# Patient Record
Sex: Female | Born: 1935 | Race: White | Hispanic: No | Marital: Single | Smoking: Former smoker
Health system: Southern US, Community
[De-identification: ages and names within clinical notes are randomized; demographics above are authoritative.]

## PROBLEM LIST (undated history)

## (undated) DIAGNOSIS — K000 Anodontia: Secondary | ICD-10-CM

## (undated) DIAGNOSIS — Z86718 Personal history of other venous thrombosis and embolism: Secondary | ICD-10-CM

## (undated) DIAGNOSIS — R0602 Shortness of breath: Secondary | ICD-10-CM

## (undated) DIAGNOSIS — D638 Anemia in other chronic diseases classified elsewhere: Secondary | ICD-10-CM

## (undated) DIAGNOSIS — D696 Thrombocytopenia, unspecified: Secondary | ICD-10-CM

## (undated) DIAGNOSIS — E119 Type 2 diabetes mellitus without complications: Secondary | ICD-10-CM

## (undated) DIAGNOSIS — N189 Chronic kidney disease, unspecified: Secondary | ICD-10-CM

## (undated) DIAGNOSIS — K219 Gastro-esophageal reflux disease without esophagitis: Secondary | ICD-10-CM

## (undated) DIAGNOSIS — J4540 Moderate persistent asthma, uncomplicated: Secondary | ICD-10-CM

## (undated) DIAGNOSIS — M170 Bilateral primary osteoarthritis of knee: Secondary | ICD-10-CM

## (undated) DIAGNOSIS — W19XXXA Unspecified fall, initial encounter: Secondary | ICD-10-CM

## (undated) DIAGNOSIS — E785 Hyperlipidemia, unspecified: Secondary | ICD-10-CM

## (undated) DIAGNOSIS — F39 Unspecified mood [affective] disorder: Secondary | ICD-10-CM

## (undated) DIAGNOSIS — R251 Tremor, unspecified: Secondary | ICD-10-CM

## (undated) DIAGNOSIS — K08109 Complete loss of teeth, unspecified cause, unspecified class: Secondary | ICD-10-CM

## (undated) DIAGNOSIS — I421 Obstructive hypertrophic cardiomyopathy: Secondary | ICD-10-CM

## (undated) DIAGNOSIS — F32A Depression, unspecified: Secondary | ICD-10-CM

## (undated) DIAGNOSIS — R296 Repeated falls: Secondary | ICD-10-CM

## (undated) DIAGNOSIS — Z9289 Personal history of other medical treatment: Secondary | ICD-10-CM

## (undated) DIAGNOSIS — I739 Peripheral vascular disease, unspecified: Secondary | ICD-10-CM

## (undated) DIAGNOSIS — M8580 Other specified disorders of bone density and structure, unspecified site: Secondary | ICD-10-CM

## (undated) DIAGNOSIS — F70 Mild intellectual disabilities: Secondary | ICD-10-CM

## (undated) DIAGNOSIS — I10 Essential (primary) hypertension: Secondary | ICD-10-CM

## (undated) DIAGNOSIS — Z7901 Long term (current) use of anticoagulants: Secondary | ICD-10-CM

## (undated) DIAGNOSIS — T7840XA Allergy, unspecified, initial encounter: Secondary | ICD-10-CM

## (undated) DIAGNOSIS — F329 Major depressive disorder, single episode, unspecified: Secondary | ICD-10-CM

## (undated) HISTORY — DX: Thrombocytopenia, unspecified: D69.6

## (undated) HISTORY — DX: Other specified disorders of bone density and structure, unspecified site: M85.80

## (undated) HISTORY — DX: Long term (current) use of anticoagulants: Z79.01

## (undated) HISTORY — DX: Major depressive disorder, single episode, unspecified: F32.9

## (undated) HISTORY — DX: Hyperlipidemia, unspecified: E78.5

## (undated) HISTORY — DX: Chronic kidney disease, unspecified: N18.9

## (undated) HISTORY — DX: Type 2 diabetes mellitus without complications: E11.9

## (undated) HISTORY — DX: Tremor, unspecified: R25.1

## (undated) HISTORY — DX: Anodontia: K00.0

## (undated) HISTORY — PX: ABDOMINAL HYSTERECTOMY: SHX81

## (undated) HISTORY — DX: Unspecified mood (affective) disorder: F39

## (undated) HISTORY — DX: Gastro-esophageal reflux disease without esophagitis: K21.9

## (undated) HISTORY — PX: ENDOSCOPIC RETROGRADE CHOLANGIOPANCREATOGRAPHY W/ SPHINCTEROTOMY AND STONE REMOVAL: SHX1506

## (undated) HISTORY — DX: Shortness of breath: R06.02

## (undated) HISTORY — DX: Moderate persistent asthma, uncomplicated: J45.40

## (undated) HISTORY — DX: Personal history of other venous thrombosis and embolism: Z86.718

## (undated) HISTORY — DX: Allergy, unspecified, initial encounter: T78.40XA

## (undated) HISTORY — DX: Depression, unspecified: F32.A

## (undated) HISTORY — DX: Anemia in other chronic diseases classified elsewhere: D63.8

## (undated) HISTORY — DX: Unspecified fall, initial encounter: W19.XXXA

## (undated) HISTORY — DX: Obstructive hypertrophic cardiomyopathy: I42.1

## (undated) HISTORY — DX: Personal history of other medical treatment: Z92.89

## (undated) HISTORY — DX: Complete loss of teeth, unspecified cause, unspecified class: K08.109

## (undated) HISTORY — PX: CHOLECYSTECTOMY: SHX55

## (undated) HISTORY — DX: Bilateral primary osteoarthritis of knee: M17.0

## (undated) HISTORY — DX: Repeated falls: R29.6

## (undated) HISTORY — DX: Mild intellectual disabilities: F70

## (undated) HISTORY — PX: OTHER SURGICAL HISTORY: SHX169

## (undated) HISTORY — DX: Peripheral vascular disease, unspecified: I73.9

## (undated) HISTORY — DX: Essential (primary) hypertension: I10

---

## 2005-07-06 ENCOUNTER — Emergency Department (HOSPITAL_COMMUNITY): Admission: EM | Admit: 2005-07-06 | Discharge: 2005-07-07 | Payer: Self-pay | Admitting: Emergency Medicine

## 2005-08-23 ENCOUNTER — Inpatient Hospital Stay (HOSPITAL_COMMUNITY): Admission: EM | Admit: 2005-08-23 | Discharge: 2005-08-28 | Payer: Self-pay | Admitting: Emergency Medicine

## 2005-08-25 ENCOUNTER — Encounter (INDEPENDENT_AMBULATORY_CARE_PROVIDER_SITE_OTHER): Payer: Self-pay | Admitting: *Deleted

## 2005-09-27 ENCOUNTER — Ambulatory Visit (HOSPITAL_COMMUNITY): Admission: RE | Admit: 2005-09-27 | Discharge: 2005-09-27 | Payer: Self-pay | Admitting: Gastroenterology

## 2006-12-25 ENCOUNTER — Ambulatory Visit: Payer: Self-pay | Admitting: Cardiology

## 2006-12-25 ENCOUNTER — Inpatient Hospital Stay (HOSPITAL_COMMUNITY): Admission: EM | Admit: 2006-12-25 | Discharge: 2006-12-28 | Payer: Self-pay | Admitting: Emergency Medicine

## 2006-12-26 ENCOUNTER — Ambulatory Visit: Payer: Self-pay | Admitting: Vascular Surgery

## 2006-12-26 ENCOUNTER — Encounter: Payer: Self-pay | Admitting: Cardiology

## 2007-05-14 ENCOUNTER — Ambulatory Visit (HOSPITAL_COMMUNITY): Admission: RE | Admit: 2007-05-14 | Discharge: 2007-05-14 | Payer: Self-pay | Admitting: Internal Medicine

## 2007-07-08 ENCOUNTER — Ambulatory Visit: Payer: Self-pay | Admitting: Internal Medicine

## 2008-08-22 ENCOUNTER — Ambulatory Visit (HOSPITAL_COMMUNITY): Admission: RE | Admit: 2008-08-22 | Discharge: 2008-08-22 | Payer: Self-pay | Admitting: Internal Medicine

## 2008-09-28 ENCOUNTER — Ambulatory Visit (HOSPITAL_COMMUNITY): Admission: RE | Admit: 2008-09-28 | Discharge: 2008-09-28 | Payer: Self-pay | Admitting: Obstetrics

## 2008-10-03 ENCOUNTER — Encounter: Admission: RE | Admit: 2008-10-03 | Discharge: 2008-10-03 | Payer: Self-pay | Admitting: Obstetrics

## 2009-03-10 ENCOUNTER — Ambulatory Visit (HOSPITAL_COMMUNITY): Admission: RE | Admit: 2009-03-10 | Discharge: 2009-03-10 | Payer: Self-pay | Admitting: Internal Medicine

## 2009-03-16 ENCOUNTER — Ambulatory Visit (HOSPITAL_COMMUNITY): Admission: RE | Admit: 2009-03-16 | Discharge: 2009-03-16 | Payer: Self-pay | Admitting: Gastroenterology

## 2009-07-14 ENCOUNTER — Inpatient Hospital Stay (HOSPITAL_COMMUNITY): Admission: EM | Admit: 2009-07-14 | Discharge: 2009-07-17 | Payer: Self-pay | Admitting: Emergency Medicine

## 2009-09-06 ENCOUNTER — Ambulatory Visit: Payer: Self-pay | Admitting: Family Medicine

## 2009-09-27 ENCOUNTER — Ambulatory Visit: Payer: Self-pay | Admitting: Family Medicine

## 2009-10-09 ENCOUNTER — Ambulatory Visit: Payer: Self-pay | Admitting: Family Medicine

## 2009-10-11 ENCOUNTER — Ambulatory Visit: Payer: Self-pay | Admitting: Family Medicine

## 2009-11-28 ENCOUNTER — Ambulatory Visit: Payer: Self-pay | Admitting: Family Medicine

## 2009-11-29 ENCOUNTER — Encounter: Admission: RE | Admit: 2009-11-29 | Discharge: 2009-11-29 | Payer: Self-pay | Admitting: Family Medicine

## 2009-12-06 ENCOUNTER — Ambulatory Visit: Payer: Self-pay | Admitting: Family Medicine

## 2009-12-13 ENCOUNTER — Ambulatory Visit: Payer: Self-pay | Admitting: Family Medicine

## 2009-12-27 ENCOUNTER — Ambulatory Visit: Payer: Self-pay | Admitting: Family Medicine

## 2010-01-02 ENCOUNTER — Ambulatory Visit: Payer: Self-pay | Admitting: Family Medicine

## 2010-02-22 ENCOUNTER — Ambulatory Visit: Payer: Self-pay | Admitting: Family Medicine

## 2010-02-23 ENCOUNTER — Encounter: Admission: RE | Admit: 2010-02-23 | Discharge: 2010-02-23 | Payer: Self-pay | Admitting: Family Medicine

## 2010-03-12 ENCOUNTER — Emergency Department (HOSPITAL_COMMUNITY): Admission: EM | Admit: 2010-03-12 | Discharge: 2010-03-12 | Payer: Self-pay | Admitting: Emergency Medicine

## 2010-03-15 ENCOUNTER — Ambulatory Visit: Payer: Self-pay | Admitting: Family Medicine

## 2010-03-29 ENCOUNTER — Ambulatory Visit: Payer: Self-pay | Admitting: Family Medicine

## 2010-04-03 ENCOUNTER — Ambulatory Visit: Payer: Self-pay | Admitting: Oncology

## 2010-04-06 ENCOUNTER — Emergency Department (HOSPITAL_COMMUNITY): Admission: EM | Admit: 2010-04-06 | Discharge: 2010-04-06 | Payer: Self-pay | Admitting: Emergency Medicine

## 2010-04-12 LAB — CBC & DIFF AND RETIC
Eosinophils Absolute: 0.2 10*3/uL (ref 0.0–0.5)
Immature Retic Fract: 1.6 % (ref 0.00–10.70)
MONO#: 0.5 10*3/uL (ref 0.1–0.9)
NEUT#: 3.6 10*3/uL (ref 1.5–6.5)
Platelets: 106 10*3/uL — ABNORMAL LOW (ref 145–400)
RBC: 3.81 10*6/uL (ref 3.70–5.45)
RDW: 13.8 % (ref 11.2–14.5)
Retic %: 1.24 % (ref 0.50–1.50)
WBC: 5.3 10*3/uL (ref 3.9–10.3)
lymph#: 0.9 10*3/uL (ref 0.9–3.3)
nRBC: 0 % (ref 0–0)

## 2010-04-12 LAB — MORPHOLOGY: PLT EST: DECREASED

## 2010-04-14 LAB — IRON AND TIBC
Iron: 68 ug/dL (ref 42–145)
TIBC: 279 ug/dL (ref 250–470)
UIBC: 211 ug/dL

## 2010-04-14 LAB — FERRITIN: Ferritin: 89 ng/mL (ref 10–291)

## 2010-04-14 LAB — LACTATE DEHYDROGENASE: LDH: 165 U/L (ref 94–250)

## 2010-04-14 LAB — COMPREHENSIVE METABOLIC PANEL
Alkaline Phosphatase: 67 U/L (ref 39–117)
BUN: 24 mg/dL — ABNORMAL HIGH (ref 6–23)
Creatinine, Ser: 1.33 mg/dL — ABNORMAL HIGH (ref 0.40–1.20)
Glucose, Bld: 112 mg/dL — ABNORMAL HIGH (ref 70–99)
Total Bilirubin: 0.4 mg/dL (ref 0.3–1.2)

## 2010-04-14 LAB — SEDIMENTATION RATE: Sed Rate: 37 mm/hr — ABNORMAL HIGH (ref 0–22)

## 2010-04-14 LAB — FOLATE RBC: RBC Folate: 1116 ng/mL — ABNORMAL HIGH (ref 180–600)

## 2010-04-24 ENCOUNTER — Encounter: Admission: RE | Admit: 2010-04-24 | Discharge: 2010-05-16 | Payer: Self-pay | Admitting: Orthopedic Surgery

## 2010-04-26 ENCOUNTER — Ambulatory Visit: Payer: Self-pay | Admitting: Family Medicine

## 2010-05-29 ENCOUNTER — Ambulatory Visit: Payer: Self-pay | Admitting: Family Medicine

## 2010-06-11 ENCOUNTER — Encounter: Admission: RE | Admit: 2010-06-11 | Discharge: 2010-06-11 | Payer: Self-pay | Admitting: Family Medicine

## 2010-06-11 LAB — HM MAMMOGRAPHY

## 2010-06-20 ENCOUNTER — Encounter: Admission: RE | Admit: 2010-06-20 | Discharge: 2010-06-20 | Payer: Self-pay | Admitting: Family Medicine

## 2010-07-04 ENCOUNTER — Ambulatory Visit: Payer: Self-pay | Admitting: Family Medicine

## 2010-07-19 ENCOUNTER — Ambulatory Visit: Payer: Self-pay | Admitting: Family Medicine

## 2010-07-25 ENCOUNTER — Ambulatory Visit: Payer: Self-pay | Admitting: Family Medicine

## 2010-07-31 ENCOUNTER — Ambulatory Visit: Payer: Self-pay | Admitting: Oncology

## 2010-08-02 LAB — CBC WITH DIFFERENTIAL/PLATELET
BASO%: 0.3 % (ref 0.0–2.0)
HCT: 34.7 % — ABNORMAL LOW (ref 34.8–46.6)
LYMPH%: 20.5 % (ref 14.0–49.7)
MCHC: 34.4 g/dL (ref 31.5–36.0)
MONO#: 0.6 10*3/uL (ref 0.1–0.9)
NEUT%: 65.6 % (ref 38.4–76.8)
Platelets: 121 10*3/uL — ABNORMAL LOW (ref 145–400)
WBC: 5 10*3/uL (ref 3.9–10.3)

## 2010-08-09 ENCOUNTER — Ambulatory Visit: Payer: Self-pay | Admitting: Family Medicine

## 2010-08-20 ENCOUNTER — Ambulatory Visit: Payer: Self-pay | Admitting: Family Medicine

## 2010-08-20 ENCOUNTER — Encounter: Admission: RE | Admit: 2010-08-20 | Discharge: 2010-08-20 | Payer: Self-pay | Admitting: Family Medicine

## 2010-08-23 HISTORY — PX: COLONOSCOPY: SHX174

## 2010-08-29 ENCOUNTER — Encounter: Admission: RE | Admit: 2010-08-29 | Discharge: 2010-08-29 | Payer: Self-pay | Admitting: Family Medicine

## 2010-08-31 ENCOUNTER — Ambulatory Visit: Payer: Self-pay | Admitting: Family Medicine

## 2010-09-19 ENCOUNTER — Ambulatory Visit: Payer: Self-pay | Admitting: Family Medicine

## 2010-10-02 ENCOUNTER — Ambulatory Visit: Payer: Self-pay | Admitting: Physician Assistant

## 2010-10-17 ENCOUNTER — Ambulatory Visit: Payer: Self-pay | Admitting: Family Medicine

## 2010-10-28 DIAGNOSIS — D696 Thrombocytopenia, unspecified: Secondary | ICD-10-CM

## 2010-10-28 DIAGNOSIS — D638 Anemia in other chronic diseases classified elsewhere: Secondary | ICD-10-CM

## 2010-10-28 HISTORY — PX: TOENAIL EXCISION: SHX183

## 2010-10-28 HISTORY — DX: Anemia in other chronic diseases classified elsewhere: D63.8

## 2010-10-28 HISTORY — DX: Thrombocytopenia, unspecified: D69.6

## 2010-11-02 ENCOUNTER — Ambulatory Visit
Admission: RE | Admit: 2010-11-02 | Discharge: 2010-11-02 | Payer: Self-pay | Source: Home / Self Care | Attending: Family Medicine | Admitting: Family Medicine

## 2010-11-05 ENCOUNTER — Ambulatory Visit
Admission: RE | Admit: 2010-11-05 | Discharge: 2010-11-05 | Payer: Self-pay | Source: Home / Self Care | Attending: Family Medicine | Admitting: Family Medicine

## 2010-11-08 ENCOUNTER — Ambulatory Visit: Payer: Self-pay | Admitting: Oncology

## 2010-11-13 ENCOUNTER — Ambulatory Visit
Admission: RE | Admit: 2010-11-13 | Discharge: 2010-11-13 | Payer: Self-pay | Source: Home / Self Care | Attending: Family Medicine | Admitting: Family Medicine

## 2010-11-20 LAB — CBC WITH DIFFERENTIAL/PLATELET
Basophils Absolute: 0 10*3/uL (ref 0.0–0.1)
Eosinophils Absolute: 0.2 10*3/uL (ref 0.0–0.5)
HCT: 36.1 % (ref 34.8–46.6)
HGB: 12.3 g/dL (ref 11.6–15.9)
MONO#: 0.6 10*3/uL (ref 0.1–0.9)
NEUT%: 68 % (ref 38.4–76.8)
Platelets: 125 10*3/uL — ABNORMAL LOW (ref 145–400)
WBC: 5.3 10*3/uL (ref 3.9–10.3)
lymph#: 1 10*3/uL (ref 0.9–3.3)

## 2010-11-22 LAB — LACTATE DEHYDROGENASE: LDH: 161 U/L (ref 94–250)

## 2010-11-22 LAB — IRON AND TIBC
Iron: 122 ug/dL (ref 42–145)
TIBC: 282 ug/dL (ref 250–470)
UIBC: 160 ug/dL

## 2010-11-22 LAB — COMPREHENSIVE METABOLIC PANEL
ALT: 11 U/L (ref 0–35)
BUN: 29 mg/dL — ABNORMAL HIGH (ref 6–23)
CO2: 24 mEq/L (ref 19–32)
Calcium: 9.2 mg/dL (ref 8.4–10.5)
Chloride: 102 mEq/L (ref 96–112)
Creatinine, Ser: 1.51 mg/dL — ABNORMAL HIGH (ref 0.40–1.20)
Glucose, Bld: 95 mg/dL (ref 70–99)

## 2010-11-27 ENCOUNTER — Ambulatory Visit
Admission: RE | Admit: 2010-11-27 | Discharge: 2010-11-27 | Payer: Self-pay | Source: Home / Self Care | Attending: Family Medicine | Admitting: Family Medicine

## 2010-11-28 DIAGNOSIS — Z9289 Personal history of other medical treatment: Secondary | ICD-10-CM

## 2010-11-28 HISTORY — DX: Personal history of other medical treatment: Z92.89

## 2010-12-10 ENCOUNTER — Ambulatory Visit (INDEPENDENT_AMBULATORY_CARE_PROVIDER_SITE_OTHER): Payer: Medicare Other

## 2010-12-10 DIAGNOSIS — Z79899 Other long term (current) drug therapy: Secondary | ICD-10-CM

## 2011-01-01 ENCOUNTER — Ambulatory Visit: Payer: Self-pay | Admitting: Family Medicine

## 2011-01-02 ENCOUNTER — Ambulatory Visit: Payer: Self-pay | Admitting: Family Medicine

## 2011-01-04 ENCOUNTER — Ambulatory Visit (INDEPENDENT_AMBULATORY_CARE_PROVIDER_SITE_OTHER): Payer: Medicare Other | Admitting: Family Medicine

## 2011-01-04 DIAGNOSIS — Z79899 Other long term (current) drug therapy: Secondary | ICD-10-CM

## 2011-01-04 DIAGNOSIS — I10 Essential (primary) hypertension: Secondary | ICD-10-CM

## 2011-01-04 DIAGNOSIS — E119 Type 2 diabetes mellitus without complications: Secondary | ICD-10-CM

## 2011-01-04 DIAGNOSIS — D649 Anemia, unspecified: Secondary | ICD-10-CM

## 2011-01-14 LAB — DIFFERENTIAL
Basophils Relative: 0 % (ref 0–1)
Eosinophils Relative: 2 % (ref 0–5)
Monocytes Absolute: 0.5 10*3/uL (ref 0.1–1.0)
Monocytes Relative: 8 % (ref 3–12)
Neutro Abs: 4.5 10*3/uL (ref 1.7–7.7)

## 2011-01-14 LAB — COMPREHENSIVE METABOLIC PANEL
AST: 16 U/L (ref 0–37)
Albumin: 3.6 g/dL (ref 3.5–5.2)
Alkaline Phosphatase: 68 U/L (ref 39–117)
BUN: 28 mg/dL — ABNORMAL HIGH (ref 6–23)
GFR calc Af Amer: 39 mL/min — ABNORMAL LOW (ref >60)
Potassium: 5.3 mEq/L — ABNORMAL HIGH (ref 3.5–5.1)
Sodium: 138 mEq/L (ref 135–145)
Total Protein: 7.1 g/dL (ref 6.0–8.3)

## 2011-01-14 LAB — GLUCOSE, CAPILLARY

## 2011-01-14 LAB — CBC
HCT: 35.3 % — ABNORMAL LOW (ref 36.0–46.0)
Platelets: 126 10*3/uL — ABNORMAL LOW (ref 150–400)
RDW: 13.4 % (ref 11.5–15.5)
WBC: 6.3 10*3/uL (ref 4.0–10.5)

## 2011-01-14 LAB — POTASSIUM
Potassium: 4.9 mEq/L (ref 3.5–5.1)
Potassium: 5.1 mEq/L (ref 3.5–5.1)

## 2011-01-14 LAB — PROTIME-INR
INR: 1.8 — ABNORMAL HIGH (ref 0.00–1.49)
Prothrombin Time: 20.7 seconds — ABNORMAL HIGH (ref 11.6–15.2)

## 2011-01-31 ENCOUNTER — Ambulatory Visit (INDEPENDENT_AMBULATORY_CARE_PROVIDER_SITE_OTHER): Payer: Medicare Other | Admitting: Family Medicine

## 2011-01-31 DIAGNOSIS — J45909 Unspecified asthma, uncomplicated: Secondary | ICD-10-CM

## 2011-01-31 DIAGNOSIS — E119 Type 2 diabetes mellitus without complications: Secondary | ICD-10-CM

## 2011-01-31 DIAGNOSIS — J309 Allergic rhinitis, unspecified: Secondary | ICD-10-CM

## 2011-01-31 DIAGNOSIS — H6120 Impacted cerumen, unspecified ear: Secondary | ICD-10-CM

## 2011-02-01 LAB — BASIC METABOLIC PANEL
BUN: 12 mg/dL (ref 6–23)
BUN: 14 mg/dL (ref 6–23)
CO2: 29 mEq/L (ref 19–32)
CO2: 30 mEq/L (ref 19–32)
CO2: 31 mEq/L (ref 19–32)
Calcium: 9.3 mg/dL (ref 8.4–10.5)
Calcium: 9.5 mg/dL (ref 8.4–10.5)
Chloride: 96 mEq/L (ref 96–112)
Chloride: 99 mEq/L (ref 96–112)
Creatinine, Ser: 1.29 mg/dL — ABNORMAL HIGH (ref 0.4–1.2)
Creatinine, Ser: 1.46 mg/dL — ABNORMAL HIGH (ref 0.4–1.2)
GFR calc Af Amer: 43 mL/min — ABNORMAL LOW (ref >60)
GFR calc Af Amer: 49 mL/min — ABNORMAL LOW (ref >60)
GFR calc non Af Amer: 39 mL/min — ABNORMAL LOW (ref >60)
Glucose, Bld: 117 mg/dL — ABNORMAL HIGH (ref 70–99)
Glucose, Bld: 92 mg/dL (ref 70–99)
Potassium: 4.6 mEq/L (ref 3.5–5.1)
Potassium: 4.9 mEq/L (ref 3.5–5.1)
Sodium: 133 mEq/L — ABNORMAL LOW (ref 135–145)
Sodium: 139 mEq/L (ref 135–145)

## 2011-02-01 LAB — GLUCOSE, CAPILLARY
Glucose-Capillary: 114 mg/dL — ABNORMAL HIGH (ref 70–99)
Glucose-Capillary: 118 mg/dL — ABNORMAL HIGH (ref 70–99)
Glucose-Capillary: 120 mg/dL — ABNORMAL HIGH (ref 70–99)
Glucose-Capillary: 121 mg/dL — ABNORMAL HIGH (ref 70–99)
Glucose-Capillary: 127 mg/dL — ABNORMAL HIGH (ref 70–99)
Glucose-Capillary: 131 mg/dL — ABNORMAL HIGH (ref 70–99)
Glucose-Capillary: 98 mg/dL (ref 70–99)

## 2011-02-01 LAB — RETICULOCYTES
RBC.: 3.94 MIL/uL (ref 3.87–5.11)
Retic Count, Absolute: 39.4 10*3/uL (ref 19.0–186.0)

## 2011-02-01 LAB — CBC
HCT: 33.5 % — ABNORMAL LOW (ref 36.0–46.0)
HCT: 37.1 % (ref 36.0–46.0)
Hemoglobin: 11.4 g/dL — ABNORMAL LOW (ref 12.0–15.0)
Hemoglobin: 12.6 g/dL (ref 12.0–15.0)
MCHC: 33.9 g/dL (ref 30.0–36.0)
MCHC: 34.2 g/dL (ref 30.0–36.0)
MCV: 87.8 fL (ref 78.0–100.0)
MCV: 88 fL (ref 78.0–100.0)
Platelets: 101 10*3/uL — ABNORMAL LOW (ref 150–400)
Platelets: 115 10*3/uL — ABNORMAL LOW (ref 150–400)
RBC: 3.89 MIL/uL (ref 3.87–5.11)
RBC: 4.01 MIL/uL (ref 3.87–5.11)
RDW: 13.2 % (ref 11.5–15.5)
RDW: 13.3 % (ref 11.5–15.5)
WBC: 5 10*3/uL (ref 4.0–10.5)
WBC: 5.4 10*3/uL (ref 4.0–10.5)

## 2011-02-01 LAB — CARDIOLIPIN ANTIBODIES, IGG, IGM, IGA
Anticardiolipin IgA: <10 APL U/mL — ABNORMAL LOW (ref <10)
Anticardiolipin IgG: <10 GPL U/mL — ABNORMAL LOW (ref <10)
Anticardiolipin IgM: <10 MPL U/mL — ABNORMAL LOW (ref <10)

## 2011-02-01 LAB — PROTIME-INR
INR: 1 (ref 0.00–1.49)
INR: 1.1 (ref 0.00–1.49)
INR: 1.1 (ref 0.00–1.49)
Prothrombin Time: 13.7 seconds (ref 11.6–15.2)
Prothrombin Time: 14.1 seconds (ref 11.6–15.2)

## 2011-02-01 LAB — LUPUS ANTICOAGULANT PANEL
Lupus Anticoagulant: NOT DETECTED
PTTLA Confirmation: 0 secs (ref <8.0)
dRVVT Incubated 1:1 Mix: 39.4 secs (ref 36.1–47.0)

## 2011-02-01 LAB — FOLATE: Folate: >20 ng/mL

## 2011-02-01 LAB — DIFFERENTIAL
Lymphs Abs: 0.9 10*3/uL (ref 0.7–4.0)
Monocytes Relative: 11 % (ref 3–12)
Neutro Abs: 3.4 10*3/uL (ref 1.7–7.7)
Neutrophils Relative %: 69 % (ref 43–77)

## 2011-02-01 LAB — APTT: aPTT: 25 seconds (ref 24–37)

## 2011-02-01 LAB — PROTEIN C ACTIVITY: Protein C Activity: 143 % — ABNORMAL HIGH (ref 75–133)

## 2011-02-01 LAB — PROTHROMBIN GENE MUTATION

## 2011-02-01 LAB — PROTEIN S ACTIVITY: Protein S Activity: 87 % (ref 69–129)

## 2011-02-01 LAB — FERRITIN: Ferritin: 112 ng/mL (ref 10–291)

## 2011-02-01 LAB — IRON AND TIBC: TIBC: 269 ug/dL (ref 250–470)

## 2011-02-19 ENCOUNTER — Ambulatory Visit: Payer: Medicare Other | Admitting: Family Medicine

## 2011-02-25 ENCOUNTER — Encounter: Payer: Self-pay | Admitting: Family Medicine

## 2011-03-07 ENCOUNTER — Ambulatory Visit (INDEPENDENT_AMBULATORY_CARE_PROVIDER_SITE_OTHER): Payer: Medicare Other | Admitting: Family Medicine

## 2011-03-07 ENCOUNTER — Encounter: Payer: Self-pay | Admitting: Family Medicine

## 2011-03-07 VITALS — BP 110/60 | HR 68 | Wt 203.0 lb

## 2011-03-07 DIAGNOSIS — N390 Urinary tract infection, site not specified: Secondary | ICD-10-CM

## 2011-03-07 DIAGNOSIS — M1710 Unilateral primary osteoarthritis, unspecified knee: Secondary | ICD-10-CM

## 2011-03-07 DIAGNOSIS — M1711 Unilateral primary osteoarthritis, right knee: Secondary | ICD-10-CM

## 2011-03-07 DIAGNOSIS — I82409 Acute embolism and thrombosis of unspecified deep veins of unspecified lower extremity: Secondary | ICD-10-CM

## 2011-03-07 DIAGNOSIS — R899 Unspecified abnormal finding in specimens from other organs, systems and tissues: Secondary | ICD-10-CM

## 2011-03-07 DIAGNOSIS — R6889 Other general symptoms and signs: Secondary | ICD-10-CM

## 2011-03-07 LAB — POCT URINALYSIS DIPSTICK
Bilirubin, UA: NEGATIVE
Blood, UA: NEGATIVE
Glucose, UA: NEGATIVE
Spec Grav, UA: 1.01
Urobilinogen, UA: NEGATIVE

## 2011-03-07 MED ORDER — TRAMADOL HCL 50 MG PO TABS: 50.0000 mg | ORAL_TABLET | Freq: Four times a day (QID) | ORAL | Status: DC | PRN

## 2011-03-07 NOTE — Progress Notes (Signed)
  Subjective:    Patient ID: [name], female    DOB: November 06, 1934, 76 y.o.   MRN: [id number]  HPI she is here for recheck on her urinalysis. She has had difficulty with this in the past. She also needs a repeat PT/INR. She does have an underlying history of DVT. She also complains of right knee pain. Tramadol has worked well in the past.   Review of Systems     Objective:   Physical Exam exam of the right knee shows no effusion with slight crepitus. No laxity is noted. Urinalysis dipstick and micro was okay        Assessment & Plan:

## 2011-03-07 NOTE — Patient Instructions (Signed)
The tramadol as needed for the knee pain. Return here in one month for recheck on your Coumadin

## 2011-03-08 ENCOUNTER — Telehealth: Payer: Self-pay

## 2011-03-08 LAB — PROTIME-INR
INR: 2.31 — ABNORMAL HIGH (ref <1.50)
Prothrombin Time: 25.5 seconds — ABNORMAL HIGH (ref 11.6–15.2)

## 2011-03-08 NOTE — Telephone Encounter (Signed)
Left mess for deb to continue on present dos of med re check 1 mth

## 2011-03-12 NOTE — Assessment & Plan Note (Signed)
HEALTHCARE                             PULMONARY OFFICE NOTE   [name], [name]                       MRN:          [id number]  DATE:07/08/2007                            DOB:          28-Aug-1935    REFERRING PHYSICIAN:  Nanetta Batty, M.D.   PRIMARY PHYSICIAN:  Dr. Leanord Hawking   HISTORY:  A 75 year old white female with a history of smoking, long-  term nursing home placement patient with several weeks to several months  (I never could be sure) of dyspnea that occurs both at rest and with  exertion but better with nebulizer therapy.  I understand Dr. Allyson Sabal was  concerned about the possibility of ACE inhibitor intolerance and took  her off of it but not sure when her last dose was.  She says she is  some better presently but continues to be short of breath with  paroxysms of dyspnea at risk that do not occur nocturnally and are  associated with a dry cough.  She denies any pleuritic pain, fevers,  chills, sweats, orthopnea, PND, or excessive sputum production.   PAST MEDICAL HISTORY:  1. Hypertension.  2. Diabetes.  3. Cervical cancer.  4. Remote hysterectomy as well as a diagnosis of GERD.   ALLERGIES:  LEVAQUIN.   MEDICATIONS:  1. Albuterol p.r.n.  2. Prilosec 40 mg daily.   SOCIAL HISTORY:  She quit smoking, does not know when.   FAMILY HISTORY:  Unobtainable.   REVIEW OF SYSTEMS:  Taken in detail on the work sheet and negative  except as outlined above.   PHYSICAL EXAMINATION:  This is a severely debilitated obese white female  who can barely stand without assistance.  She has stable vital signs.  HEENT:  Unremarkable.  Pharynx clear.  Lung fields reveal marked pseudowheeze.  There is a regular rate and rhythm without murmur, gallop, or rub.  Breath sounds are diminished, but there was no true wheezing  appreciated.  ABDOMEN:  Soft, benign, with no palpable organomegaly or masses.  EXTREMITIES:  Warm without calf tenderness,  cyanosis, or clubbing.   We attempted to get her up and walk her, and she had obvious severe  valgus deformity but could walk a full lap.  She dropped her saturation  to 87% after 185 feet.   Chest x-ray suggested mild scarring in the bases/COPD but no acute  process.   IMPRESSION:  She is already better, having stopped ACE inhibitors but I  believe has significant underlying gastroesophageal reflux disease.  I  have found many patients with gastroesophageal reflux disease related  pulmonary disorders that are not caused by ACE inhibitors but certainly  flare in the setting of chronic ACE exposure.  It takes a full 6 weeks  for the washout to occur and until it has been 6 weeks, I would not  recommend anything else be done in this particular situation given the  complexity of trying to have her come to the office and also obtaining  any kind of a reasonable history or response to therapy based on  question and answer which I  found extremely difficult today and time-  consuming today.   For now, I would recommend, however, the following specifics:  1. I reviewed with her caretaker a gastroesophageal reflux disease      diet, emphasizing the avoidance of mint and menthol, especially      cough drops and medicated lozenges.  2. Increase Omeprazole to b.i.d. before meals and using albuterol up      to every 4 hours p.r.n.   If not better by the 1st of November, I would like to see her back here  in the office for further evaluation and certainly if her condition  worsens in any way (I am assured that if she is actually on the mend  according to the patient and her caretaker).   I suspect she has significant underlying chronic obstructive pulmonary  disease but given again the clinical setting of a patient who is  extremely sedentary, I think she is unlikely to benefit from more  aggressive pulmonary management.     Charlaine Dalton. Sherene Sires, MD, [facility]  Electronically Signed    MBW/MedQ   DD: 07/08/2007  DT: 07/09/2007  Job #: [id number]   cc:   Nanetta Batty, M.D.  Maxwell Caul, M.D.

## 2011-03-13 ENCOUNTER — Other Ambulatory Visit: Payer: Self-pay | Admitting: Family Medicine

## 2011-03-15 NOTE — Op Note (Signed)
NAMEMERIDETH, BOSQUE                ACCOUNT NO.:  [id number]   MEDICAL RECORD NO.:  [id number]          PATIENT TYPE:  INP   LOCATION:  5036                         FACILITY:  [facility]   PHYSICIAN:  John C. Madilyn Fireman, M.D.    DATE OF BIRTH:  [birth year]   DATE OF PROCEDURE:  08/24/2005  DATE OF DISCHARGE:                                 OPERATIVE REPORT   PROCEDURE:  Endoscopic retrograde cholangiopancreatography with  sphincterotomy, stent placement stone extraction.   INDICATIONS FOR PROCEDURE:  Jaundice, fever, abdominal pain, nausea,  vomiting, dilated bile duct and gallstones seen on ultrasound.   PROCEDURE:  The patient was placed in the prone position and placed on the  pulse monitor with continuous low-flow oxygen delivered by nasal cannula.  She was sedated with 125 mcg IV fentanyl and 12.5 mg IV Versed. Olympus side-  viewing endoscope was advanced blindly down the oropharynx, esophagus and  stomach.  The pylorus was traversed, papilla Vater located on the medial  duodenal wall, it had a normal appearance and was cannulated with a Wilson-  Cook sphincterotome. Initial injections opacified the pancreatic duct which  appeared normal with repositioning of the common bile duct, we selectively  cannulated and guidewire passed high into the duct.  This  revealed a  diffusely dilated common bile duct and intrahepatic ducts with distal  narrowing with some irregularities possibly suggestive of stricture.  There  were also several various sized intraluminal filling defects consistent with  stones, the largest of which approximately 8 mm in diameter.  A large  sphincterotomy was made and then the 15 mm balloon catheter was passed on  multiple occasions, eventually delivering one 8 mm and one 4 mm stone with  several smaller stone fragments and moderate amount of whitish pus as well  as amber bile.  Repeat cholangiograms appeared to show persistent narrowing  and slight irregularity of the  distal duct suggestive of and esophageal  stricture and I elected to place a 10 French 5 cm plastic stent. This  resulted in good further drainage of bile and pus from the duct. The scope  was then withdrawn and the patient returned to the recovery room in stable  condition.  She tolerated the procedure well and there were no immediate  complications.   IMPRESSION:  Common bile duct stones with cholangitis and biliary  obstruction.   PLAN:  Continue antibiotics and will consult surgery for seizure  cholecystectomy and will need stent removed in a few weeks to months.           ______________________________  Everardo All. Madilyn Fireman, M.D.     JCH/MEDQ  D:  08/24/2005  T:  08/25/2005  Job:  [id number]   cc:   [facility]

## 2011-03-15 NOTE — H&P (Signed)
NAMELARNA, CAPELLE NO.:  [id number]   MEDICAL RECORD NO.:  [id number]          PATIENT TYPE:  INP   LOCATION:  5036                         FACILITY:  [facility]   PHYSICIAN:  Lonia Blood, M.D.       DATE OF BIRTH:  14-Apr-1935   DATE OF ADMISSION:  08/23/2005  DATE OF DISCHARGE:                                HISTORY & PHYSICAL   PRIMARY CARE PHYSICIAN:  [facility]   CHIEF COMPLAINT:  Nausea, vomiting, and inability to eat.   HISTORY OF PRESENT ILLNESS:  [name] is a 76 year old woman with  history of mild mental retardation who comes in sent from a skilled nursing  facility with 5-day history of nausea, vomiting, and feeling sick. The  nursing home staff noticed that the patient has turned progressively more  yellow, to the point where they were so concerned that they sent her to the  emergency room.   PAST MEDICAL HISTORY:  1.  Diabetes mellitus.  2.  Mild asthma.  3.  Mental retardation.  4.  Pneumonia.  5.  Per the records from the skilled nursing facility, C. difficile colitis.   MEDICATIONS AT THE SKILLED NURSING FACILITY:  1.  Cholestyramine 4 mg by mouth twice a day.  2.  Metformin 500 mg by mouth twice a day.  3.  Insulin sliding scale.  4.  DuoNeb four times a day.  5.  Lipitor 20 mg by mouth daily.  6.  Symbyax 25/6 mg by mouth daily.   FAMILY HISTORY:  Noncontributory.   SOCIAL HISTORY:  The patient is currently disabled. She lives in a skilled  nursing facility. Her only family is two sisters-in-law.   REVIEW OF SYSTEMS:  The patient denies any headaches, denies focal weakness,  focal numbness. She denies any chest pain. No shortness of breath. Reports  some mild coughing. No dysuria, no suprapubic tenderness, no rash on the  skin.   PHYSICAL EXAMINATION UPON ADMISSION:  VITAL SIGNS:  Temperature of 98.3,  pulse 82, respirations 16, blood pressure 128/68.  GENERAL APPEARANCE:  The patient appears in some  mild distress, having some  dry heaves. No projectile or protracted vomiting at this point.  HEENT:  Her head is normocephalic, atraumatic. Her eyes have pupils equal  and round, reactive to light and accommodation. Extraocular movements  intact. She has icteric sclerae. Neck is supple without JVD, no carotid  bruits. Throat is clear.  CHEST:  Has good air movement. She is clear to auscultation without wheezes,  rhonchi, or crackles.  HEART:  Shows regular rate and rhythm without murmurs, rubs, or gallops.  ABDOMEN:  Has a slight distention. There is definite right upper quadrant  tenderness but no rebound tenderness and no guarding. The bowel sounds are  decreased but present.  GENITOURINARY:  She does not have any CVA tenderness or suprapubic  tenderness.  EXTREMITIES:  There is no edema.  SKIN:  Icteric all through. No other suspicious rashes.  NEUROLOGIC:  She is able to move all four extremities. She has good  strength. She responds  to questions. She is oriented to place, not to time.   LABORATORY FINDINGS AT THE TIME OF ADMISSION:  Show a white blood cell count  of 12,000 with an absolute neutrophil count of 10,000; hemoglobin 12.2;  hematocrit of 35; platelet count of 185. Sodium 139, potassium 3.5, chloride  97, bicarb 24, BUN 13, creatinine 0.8, glucose 141, albumin 2.7, AST 210,  ALT 151, alkaline phosphatase 925, total bilirubin of 15. An abdominal  ultrasound shows dilated common bile duct with stones in the distal common  bile duct. Also, dilated intrahepatic duct.   ASSESSMENT AND PLAN:  1.  Common bile duct cholelithiasis with obstruction of the common bile      duct. Mild cholangitis with minimal elevation of white blood cell count      and the patient afebrile. The plan at this moment is to proceed with      admission to the hospital, intravenous fluid hydration, empiric      antibiotics using Unasyn at 3 g intravenously every 8 hours, and a      gastroenterological  consultation for a possible ERCP with extraction of      the stones. If the above will fail, will pursue surgical consultation      for surgical removal of the gallstones.  2.  Diabetes mellitus. For the time being, will keep the patient n.p.o. on      intravenous dextrose solution and treat the hyperglycemia with an      insulin sliding scale.  3.  History of asthma. At the moment, the patient is absolutely stable from      the respiratory point of view. Will use nebulizers as needed.  4.  Deep venous thrombosis prophylaxis will be undertaken with using      Lovenox. GI prophylaxis will be undertaken using Protonix.      Lonia Blood, M.D.  Electronically Signed     SL/MEDQ  D:  08/23/2005  T:  08/24/2005  Job:  [id number]   cc:   Shirley Friar, MD  Fax: [phone]   Maxwell Caul, M.D.  Fax: [phone]

## 2011-03-15 NOTE — Consult Note (Signed)
[name], [name]                ACCOUNT NO.:  [id number]   MEDICAL RECORD NO.:  [id number]          PATIENT TYPE:  INP   LOCATION:  5036                         FACILITY:  [facility]   PHYSICIAN:  Shirley Friar, MDDATE OF BIRTH:  Mar 15, 1935   DATE OF CONSULTATION:  08/23/2005  DATE OF DISCHARGE:                                   CONSULTATION   REQUESTING PHYSICIAN:  Lonia Blood, M.D.   HISTORY OF PRESENT ILLNESS:  This consult was completed at the request of  Dr. Lonia Blood in regards to the patient's possible cholangitis and  jaundice. The patient is a 76 year old white female transferred from a  nursing home to the emergency room tonight with five days of nausea,  vomiting, abdominal pain, and jaundice. There is no known history of this in  the past. However, unable to obtain history from patient due to mental  retardation and no family is available at the time of this consultation. The  patient had a liver function test done which showed an obstructive jaundice  picture with a total bilirubin of 15, alkaline phosphatase 925, AST 210, ALT  151, and white blood cell count of 12.2.  The patient was afebrile on  presentation to the emergency room. She had an ultrasound done which showed  intra- and extrahepatic biliary dilatation with common bile duct measuring  11 mm. Multiple stones were noted within the intrapancreatic portion of the  distal common bile duct. The gallbladder wall was noted as borderline  thickening and there was borderline splenomegaly. The patient has been  admitted to Dr. Ellin Saba service and is currently being placed on Unasyn for  empiric broad-spectrum antibiotic coverage. Unable to obtain history from  patient on these symptoms due to mental retardation.   PAST MEDICAL HISTORY:  1.  Diabetes mellitus.  2.  Asthma.  3.  Mental retardation.   MEDICATIONS ON ADMISSION:  1.  Cholestyramine 4 mg b.i.d.  2.  Metformin.  3.  Insulin.  4.  Lipitor.  5.   Symbyax.   ALLERGIES:  LEVAQUIN.   SOCIAL HISTORY:  Unable to obtain.   FAMILY HISTORY:  Unable to obtain.   PHYSICAL EXAMINATION:  VITAL SIGNS: Temperature 98.3, pulse 82, blood  pressure 128/68, respirations 16.  GENERAL: Jaundice, awake, mild acute stress.  HEENT: Icteric sclerae. Oropharynx icteric.  CHEST: Clear to auscultation bilaterally.  HEART: Regular rate and rhythm without murmurs.  ABDOMEN: Tender in the right upper quadrant and epigastric region, but  otherwise nontender, mild distention, soft, positive bowel sounds, positive  guarding in the right upper quadrant, no rebound.  EXTREMITIES: No edema.   LABORATORY DATA:  LFTs revealed total bilirubin 14.9, ALP 925, AST 210, ALT  151, albumin 2.77, total protein 7, calcium 9.2. Sodium 129, potassium 3.5,  chloride 97, CO2 24, glucose 141, BUN 13, creatinine 0.8. INR 1.0, PT 13.7  seconds. White blood cell count 12.2, hemoglobin 12.2, hematocrit 35.5,  platelet count 183,000.   Ultrasound showed common bile duct dilatation up to 11 mm, multiple stones  within the intrapancreatic portion of the distal common bile duct,  borderline gallbladder wall thickening and borderline splenomegaly, intra-  and extrahepatic biliary ductal dilatation.   ASSESSMENT:  A 76 year old white female presents with obstructive jaundice  and evidence of choledocholithiasis with possible cholangitis. The patient  does have skin findings of possible liver disease with spider angiomata as  noted, who may have a liver component as well, but at this time her acute  process appears to be obstructive jaundice from stones. We will treat with  broad-spectrum antibiotic using Unasyn. The patient will need an ERCP during  the next 24 hours to remove these stones. The patient may also need  cholecystectomy during this hospitalization, but at this time feel that ERCP  is the next step in her management. Would recommend acute and chronic  hepatitis panel  due to evidence of spider angiomata. Will have to discuss  procedure with family members who I currently cannot contact by phone to  obtain their consent prior to initiation of procedure.      Shirley Friar, MD  Electronically Signed     VCS/MEDQ  D:  08/23/2005  T:  08/24/2005  Job:  [id number]   cc:   Everardo All. Madilyn Fireman, M.D.  Fax: [phone]

## 2011-03-15 NOTE — Discharge Summary (Signed)
[name], [name] NO.:  [id number]   MEDICAL RECORD NO.:  [id number]          PATIENT TYPE:  INP   LOCATION:  2019                         FACILITY:  [facility]   PHYSICIAN:  Isidor Holts, M.D.  DATE OF BIRTH:  1934-11-19   DATE OF ADMISSION:  12/25/2006  DATE OF DISCHARGE:                               DISCHARGE SUMMARY   PMD:  Maxwell Caul, M.D., [facility].   DISCHARGE DIAGNOSES:  1. Acute bronchitis.  2. Exacerbation of bronchial asthma, secondary to #1 above.  3. Type 2 diabetes mellitus.  4. Synovitis, right knee.  5. History of congestive cardiomyopathy.  6. Mild mental retardation/history of depression.   DISCHARGE MEDICATIONS:  1. Tylenol 650 mg p.o. p.r.n. t.i.d.  2. Milk of magnesia 30 cc p.o. p.r.n. constipation.  3. Phenergan 25 mg p.o. p.r.n. nausea.  4. Colace 100 mg p.o. b.i.d.  5. Prilosec 20 mg p.o. daily.  6. Amaryl 2 mg p.o. daily.  7. Zocor 20 mg p.o. nightly.  8. Lasix 20 mg p.o. daily.  9. Prozac 40 mg p.o. daily.  10.Risperdal 0.5 mg p.o. b.i.d.  11.NovoLog insulin per sliding scale q.a.c. and nightly, as follows:      CBG 60-100, no insulin; CBG 101-150, 2 units; CBG 151-200, 3 units;      CBG 201-250, 5 units; CBG 251-300, 7 units; CBG 301-350, 9 units;      CBG over 350, 11 units.  12.Lantus insulin, 10 units subcutaneously nightly.  13.Vicodin 5/500 1 p.o. p.r.n. q.4h. for pain.  14.Artificial tears, 1 drop each eye p.r.n. t.i.d.  15.Patanol eye drops 0.1% one drop each eye p.r.n. t.i.d.  16.Prednisone 30 mg p.o. daily for three days, then 20 mg p.o. daily      for three days, then 10 mg p.o. daily for three days, then stop.  17.Azithromycin 250 mg p.o. daily for 7 days only, from December 29, 2006.  18.Aspirin (enteric coated) 1 p.o. daily.  19.Mucinex 600 mg p.o. b.i.d. for one week only.  20.Albuterol MDI 2 puffs p.r.n. q.4-6h.   PROCEDURE:  1. Portable chest x-ray dated December 25, 2006:  This showed     questionable left lower lobe abnormality.  2. Two view chest x-ray dated December 25, 2006:  This showed moderate      bronchitic changes with bibasilar atelectasis, question minimal      left pleural effusion.  3. Chest CT angiogram dated December 26, 2006:  This showed no CT      evidence for pulmonary emboli.  There was normal heart size and no      mediastinal or hilar adenopathy.  Normal thoracic aorta.  There was      peribronchial thickening, particularly in the lower lung zones with      associated left lower lobe atelectasis.  Findings suggest      bronchitis.  4. A 2D echocardiogram dated December 26, 2006.  This showed overall      normal left ventricular systolic function, EF 55%.  The study was      inadequate for evaluation of left  ventricular regional wall motion.      The left ventricular wall thickness was mildly increased.  5. Bilateral lower extremity venous Doppler dated December 26, 2006:      There was no evidence of DVT or superficial thrombosis.   CONSULTATIONS:  None.   ADMISSION HISTORY:  As in H&P notes of December 25, 2006, dictated by  Dr. Elliot Cousin. However, in brief, this is a 76 year old female, with  known history of mild mental retardation, depression, type 2 diabetes  mellitus, bronchial asthma, congestive cardiomyopathy, previous history  of choledocholithiasis/cholangitis on July 29, 2005, status post open  cholecystectomy, status post ERCP/sphincterotomy, stent placement and  stone extraction, followed by interval passage of stent in September 28, 2005, who presents with chest pain and shortness of breath.  The patient  had reportedly, been evaluated at [facility] on  December 25, 2006 for right knee synovitis and had received  intralesional steroid injection.  She complained of shortness of breath  and also chest pain, which apparently had commenced the night before.  She was referred to the emergency department, where on  initial  evaluation, she was found to be tachycardic with a heart rate of 118 per  minute, had a mild leukocytosis.  Chest x-ray demonstrated findings  consistent with bronchitis.  She was therefore admitted for further  evaluation, investigation, and management.   CLINICAL COURSE:  1. Acute bronchitis:  For the details of presentation, refer to      admission history, above.  As mentioned, chest x-ray findings were      consistent with acute bronchitis.  The patient was managed with a      combination of intravenous Rocephin and Azithromycin, initially,      commenced on bronchodilators/oxygen supplementation, and Mucinex.      Clinical response was satisfactory.  By December 27, 2006, the patient      was perfectly asymptomatic and very keen to be discharged.  Her      antibiotics were converted to oral Azithromycin, which she is      expected to complete on January 03, 2007, inclusive.  White cell count      had normalized to 7.9 on December 27, 2006.   1. Exacerbation of bronchial asthma:  This was secondary to #1 above.      This responded to above-mentioned treatment measures.  In addition,      a tapering course of steroids was commenced, which the patient is      expected to complete in the coming week.  As mentioned in the      patient's history, patient complained of chest pain of two days'      duration.  The cardiac enzymes were not elevated.  A 2D      echocardiogram was quite unremarkable.  Chest CT angiogram showed      no evidence of pulmonary embolism.  Chest pain was deemed likely      secondary to #1 above, and has responded to simple analgesics.   1. History of congestive cardiomyopathy:  There were no symptoms      referable to this, throughout the course of the patient's      hospitalization.  Certainly, she showed no clinical evidence of      congestive heart failure.   1. Type 2 diabetes mellitus:  This was adequately controlled during     the course of the patient's  hospitalization, with a combination of  carbohydrate-modified diet, Amaryl in pre-admission dosage, Lantus      insulin in a low dose of 10 units subcutaneously nightly, and      sliding-scale insulin coverage.  Patient remained euglycemic      throughout the course of her hospitalization.   1. Right knee synovitis:  There were no symptoms referable to this,      during the course of the patient's hospitalization.  Physical      examination did reveal mild-to-moderate right knee effusion, but as      mentioned in the admission history, the patient had already      received intralesional steroid injection by orthopedic surgeon at      [facility].  She continues on p.r.n. analgesics.      She was noted to have somewhat increased circumference of the right      calf, necessitating evaluation of both lower extremities with      venous Doppler on December 26, 2006.  Fortunately, this showed no      evidence of DVT or superficial thrombosis.  Patient is expected to      follow up with her orthopedic surgeon on discharge, per prior      scheduled appointment.   DISPOSITION:  Provided no further acute problems arise overnight, it is  anticipated that patient will be discharged back to [facility].  She is sufficiently clinically recovered  and stable.   DIET:  Carbohydrate-modified/heart-healthy diet.   ACTIVITY:  As tolerated.   FOLLOW-UP INSTRUCTIONS:  Patient is expected to follow up with an  orthopedic surgeon per prior scheduled appointment, i.e., [employer].  She is also expected to follow up with her primary  MD, Dr. Baltazar Najjar of [facility], routinely.      Isidor Holts, M.D.  Electronically Signed     CO/MEDQ  D:  12/27/2006  T:  12/27/2006  Job:  [id number]   cc:   Maxwell Caul, M.D.

## 2011-03-15 NOTE — Op Note (Signed)
NAMEZEFFIE, BICKERT                ACCOUNT NO.:  [id number]   MEDICAL RECORD NO.:  [id number]          PATIENT TYPE:  AMB   LOCATION:  ENDO                         FACILITY:  [facility]   PHYSICIAN:  John C. Madilyn Fireman, M.D.    DATE OF BIRTH:  [birth year]   DATE OF PROCEDURE:  09/27/2005  DATE OF DISCHARGE:                                 OPERATIVE REPORT   PROCEDURE:  Endoscopic retrograde cholangiopancreatography.   INDICATIONS FOR PROCEDURE:  History of common bile duct stones with stone  extraction and stent placement due to suspicion of a stricture approximately  1 1/2 months ago. She has done very well since then after treatment for  cholangitis and had normal liver function tests on November 21. The  procedure is to remove stent and rule out further retained stones and  consider whether she needs stenting change or brushings for cytology.   DESCRIPTION OF PROCEDURE:  The patient was placed in the prone position and  placed on the pulse monitor with continuous low-flow oxygen delivered by  nasal cannula. She was sedated with 75 mcg IV fentanyl and 6 mg IV Versed.  The Olympus side-viewing endoscope was advanced blindly into the oropharynx,  esophagus, and stomach. The stomach appeared grossly normal. The pylorus was  traversed and the papilla of Vater located on the medial duodenal wall. The  stent had passed and was not visible endoscopically or fluoroscopically.  There was evidence of large previous sphincterotomy. Cannulation with the  Wilson-Cook sphincterotome was done easily and a cholangiogram obtained.  This showed a dilated common bile duct with some intrahepatic dilatation as  well as smooth tapering toward the ampulla with no obvious irregularity of  stricture and no visible intraluminal filling defects. A 15 mm balloon  catheter was passed high into the common hepatic duct, inflated and pulled  down with no stone seen either radiologically or endoscopically to be  delivered.  This was repeated once more with the same results.   RESULTS:  There was clear amber bile draining easily from the duct. The  scope was then withdrawn and the patient returned to the recovery room in  stable condition. It was felt that given her normal liver function tests and  good clinical status that her persistent ductal dilatation was probably not  clinically significant for __________ obstruction. She tolerated the  procedure well and there were no immediate complications.   IMPRESSION:  Dilated common bile duct with interval passage of previously  placed stent. No definite stricture and no persistent stones.           ______________________________  Everardo All Madilyn Fireman, M.D.     JCH/MEDQ  D:  09/27/2005  T:  09/27/2005  Job:  [id number]   cc:   Maxwell Caul, M.D.  Fax: [phone]   Ollen Gross. Vernell Morgans, M.D.  [address]  [place]  [place] [address]

## 2011-03-15 NOTE — Consult Note (Signed)
[name], [name]                ACCOUNT NO.:  [id number]   MEDICAL RECORD NO.:  [id number]          PATIENT TYPE:  INP   LOCATION:  5036                         FACILITY:  [facility]   PHYSICIAN:  Cherylynn Ridges, M.D.    DATE OF BIRTH:  [birth year]   DATE OF CONSULTATION:  DATE OF DISCHARGE:                                   CONSULTATION   Dear Dr. Pilar Plate:   Thank you very much for asking me to see [name], a very pleasant,  developmentally delayed but mildly so 76 year old female with common bile  duct stones, jaundice and gallstones.   The patient was admitted on August 23, 2005 with jaundice and common duct  stones.  She underwent an ERCP today with retrieval of multiple intraductal  stones, and now is here for a laparoscopic cholecystectomy.   PAST MEDICAL HISTORY:  1.  Significant for developmental delay.  2.  Asthma.  3.  Diabetes mellitus.  4.  Possible history of Clostridium difficile.   MEDICATIONS:  Symbyax, metformin, Lipitor, and another medication I am not  familiar with, diobenex.   She has no apparent drug allergies.   Her medications and including those that she has been started on in the  hospital include:  Lovenox, Protonix, Unasyn, morphine, Reglan, Zofran.  We  will stop her Lovenox prior to surgery.   PHYSICAL EXAMINATION:  GENERAL:  On examination today, she is afebrile  currently.  VITAL SIGNS:  Temperature 98.5, pulse 77, blood pressure 120/64.  HEENT:  She is normocephalic, atraumatic. She has scleral icterus and skin  jaundice.  NECK:  Supple.  CHEST:  Clear to auscultation and percussion.  ABDOMEN:  Soft and nontender.  She is very conversant.  She has no abdominal  pain or tenderness.  No rebound or guarding.   Her LFTs show a total bilirubin of 15.2, alkaline phosphatase 885, ALT 169,  AST 250.  LFTs are again pending for tomorrow.  Her electrolytes show a  sodium of 129.  PT is normal at 13.7.   IMPRESSION:  Severe jaundice and common duct  stones with gallstones.  The  patient should be admitted for a laparoscopic cholecystectomy.  We will stop  her Lovenox prior to surgery and also recheck her electrolytes, seeing that  her sodium should come up with normal saline solution.      Cherylynn Ridges, M.D.  Electronically Signed     JOW/MEDQ  D:  08/24/2005  T:  08/25/2005  Job:  [id number]   cc:   Dr. Pilar Plate

## 2011-03-15 NOTE — H&P (Signed)
NAMENUHA, DEGNER NO.:  [id number]   MEDICAL RECORD NO.:  [id number]          PATIENT TYPE:  EMS   LOCATION:  MAJO                         FACILITY:  [facility]   PHYSICIAN:  Elliot Cousin, M.D.    DATE OF BIRTH:  04/04/1935   DATE OF ADMISSION:  12/25/2006  DATE OF DISCHARGE:                              HISTORY & PHYSICAL   HISTORY AND PHYSICAL   PRIMARY CARE PHYSICIAN:  Dr. Baltazar Najjar   CHIEF COMPLAINT:  Chest pain and shortness of breath.   HISTORY OF PRESENT ILLNESS:  The patient is a 76 year old woman with a  past medical history significant for type 2 diabetes mellitus, asthma,  and  congestive heart failure, who presents to the emergency department  with a chief complaint of chest pain and shortness of breath.  The  patient was at [facility] today for treatment of a right knee  synovitis.  Per the notes sent over from [facility],  the patient complained of shortness of breath and chest pain there.  When questioned, the patient says that her chest pain started last  night.  At the time she was lying down.  The chest pain is located  substernally.  It is moderate in intensity.  It has been intermittent  over the past 2 days.  It is not associated with nausea, vomiting,  radiation or lightheadedness.  She has had a dry cough, diaphoresis, and  a sore throat.  She has also had some swelling in her legs, right  greater than left.  She acknowledges pain over her right knee and over  the right distal leg.  She denies any injury to her extremities.  She  denies pleurisy, abdominal pain, diarrhea, painful urination, and  lightheadedness.   During the evaluation in the emergency department, the patient is noted  to be mildly tachycardic with a heart rate of 118 beats per minute.  She  is oxygenating 98% on room air.  Her blood pressure is within normal  limits.  Her chest x-ray reveals moderate bronchitic changes with  bibasilar  atelectasis and a questionable small left pleural effusion.  Her white blood count is minimally elevated at 11.9.  Her cardiac  markers are negative.  Her EKG reveals sinus tachycardia with a heart  rate of 112 beats per minute, left axis deviation, and no other acute  changes.  The patient will be admitted for further evaluation and  management.   PAST MEDICAL HISTORY:  1. Type 2 diabetes mellitus.  2. Asthma.  3. History of congestive heart failure.  4. History of pneumonia.  5. History of C. diff colitis.  6. History of choledocholithiasis and cholangitis in October 2006.      Status post open cholecystectomy, status post ERCP, sphincterotomy,      stent placement and stone extraction.  7. Status post interval passage of previously placed stent in December      2006.  8. Mild mental retardation.  9. Depression.  10.Do not resuscitate status.   MEDICATIONS:  1. Tylenol 650 mg t.i.d.  2. Milk of Magnesia  30 cc p.r.n. for constipation.  3. Phenergan 25 mg q.6 hours p.r.n. for nausea.  4. Colace 100 mg b.i.d.  5. Prilosec 20 mg daily.  6. Amaril 2 mg daily.  7. Simvastatin 20 mg q.h.s.  8. Lasix 20 mg daily.  9. Prozac 40 mg daily.  10.Risperdal 0.5 mg b.i.d.  11.NovoLog insulin sliding scale q.a.c. and q.h.s.  12.Vicodin 5 mg every 4 hours p.r.n. for pain.  13.Artificial tears 1 drop in both eyes p.r.n.  14.Patanol eye drops 0.1% 1 drop in both eyes p.r.n.   ALLERGIES:  THE PATIENT HAS A LISTED ALLERGY FOR LEVAQUIN.   SOCIAL HISTORY:  The [facility] is a resident of [facility], learning disability.  She is single.  She has no children.  She denies  tobacco and alcohol use.  She stopped smoking a few years ago.  Her  closest relative is Ruby, who is her sister here in [place].   FAMILY HISTORY:  Her mother is deceased, cause unknown.  Her father died  secondary to a bicycle accident.   REVIEW OF SYSTEMS:  As above in the History of Present Illness.    EXAMINATION:  Temperature 98.1, blood pressure 122/62, pulse 118,  respiratory rate 20, oxygen saturation 98% on room air.  GENERAL:  The patient is a pleasant, obese, 76 year old Caucasian woman  who is currently sitting up in bed in no acute distress.  HEENT:  Head is normocephalic, non-traumatic.  Pupils equal, round and  reactive to light.  Extraocular movements are intact.  Conjunctivae are  clear.  Sclerae are white.  Nasal mucosa is dry.  No sinus tenderness.  Oropharynx reveals mildly dry mucous membranes.  No posterior exudates  or erythema.  The patient has no teeth.  Neck is supple and obese.  No  adenopathy, no thyromegaly, no bruit, no JVD.  LUNGS:  Mild diffuse wheezes bilaterally.  Breathing is non-labored.  HEART:  S1, S2 with a soft systolic murmur.  ABDOMEN:  Obese, positive bowel sounds, soft, nontender, nondistended.  No hepatosplenomegaly.  No masses palpated.  Well-healed right upper  quadrant scar.  GU and RECTAL:  Are deferred.  EXTREMITIES:  2+ pitting edema on the right and a trace of edema on the  left.  Pedal pulses are palpable.  NEUROLOGIC:  The patient is alert and oriented to herself, the hospital  and her environment.  Cranial nerves II through XII are grossly intact.  Strength is 5/5 throughout.  Sensation is intact.   EKG results are above.  Chest x-ray results are above.  CK MB 1.7,  troponin-I less than 0.05, myoglobin 116.  WBC 11.9, hemoglobin 12,  platelets 127.  Sodium 136, potassium 4.0, chloride 103, BUN 13,  creatinine 1.4, CO2 26.   ASSESSMENT:  1. Chest pain.  The patient's chest pain appears to be atypical,      however she does have significant risk factors for coronary artery      disease.  Her EKG reveals no acute ST or T wave changes.  She does      have sinus tachycardia with a heart rate of 112 beats per minute.      Myocardial infarction will need to be ruled out.  A D-dimer will be     ordered as well as a bilateral lower  extremity venous Doppler to      rule out DVT.  If the D-dimer is elevated, a CT scan of the chest  should be considered for further evaluation.  2. Moderate bronchitic changes per chest x-ray.  The patient has      audible wheezes on exam.  More than likely she has an acute      bronchitis.  She is afebrile, however her white blood cell count is      modestly elevated.  3. Right greater than left lower extremity edema.  The patient was      seen by an orthopedic physician at [facility]      today for right knee synovitis.  The knee is nontender status post      an injection earlier.  The edema may be secondary to the synovitis,      however a DVT will need to be ruled.  4. Uncontrolled diabetes mellitus.  The patient's venous glucose is      currently 254.  5. Thrombocytopenia.  The etiology of the thrombocytopenia is unclear.      Her platelet count is currently 127.   PLAN:  1. The patient will be admitted for further evaluation and management.  2. Will check cardiac enzymes times 3.  We will check a BNP, TSH and D-      dimer for further evaluation.  3. We will check a 2D echocardiogram to evaluate chest pain and her LV      function.  4. We will check bilateral lower extremity venous Dopplers to rule out      DVT.  5. We will start empiric antibiotic treatment with Rocephin and      azithromycin.  6. Symptomatic treatment with morphine, oxygen, Xopenex and Atrovent      nebulizers.  7. We will start p.r.n. nitroglycerin and aspirin.  We will also start      prophylactic Lovenox, however her platelet count will be closely      monitored.  8. Continue Amaryl and start Lantus and sliding scale NovoLog for      better glycemic control.  9. The patient is a DNR status per the notes from the skilled nursing      facility.      Elliot Cousin, M.D.  Electronically Signed     DF/MEDQ  D:  12/25/2006  T:  12/25/2006  Job:  [id number]   cc:   Maxwell Caul, M.D.

## 2011-03-15 NOTE — Discharge Summary (Signed)
[name], [name] NO.:  [id number]   MEDICAL RECORD NO.:  [id number]          PATIENT TYPE:  INP   LOCATION:  5036                         FACILITY:  [facility]   PHYSICIAN:  Hettie Holstein, D.O.    DATE OF BIRTH:  12/21/1934   DATE OF ADMISSION:  08/23/2005  DATE OF DISCHARGE:  08/28/2005                                 DISCHARGE SUMMARY   PRIMARY CARE PHYSICIAN:  Renee Care.   GENERAL SURGEON THIS ADMISSION:  Shirley Friar, M.D. and Ollen Gross. Carolynne Edouard,  M.D. and Everardo All. Madilyn Fireman, M.D.   REASON FOR ADMISSION:  Nausea and vomiting and inability to eat.   DISCHARGE DIAGNOSES:  1.  Cholelithiasis, choledocholithiasis, and cholecystitis status post open      cholecystectomy with common bile duct exploration performed by Ollen Gross.      Carolynne Edouard, M.D. and Currie Paris, M.D. with continued drain for which      the patient will be followed in the outpatient setting by [name].  The stent was placed by gastroenterology in the      common bile duct.  In addition, ERCP was performed the day prior to      Ford.  2.  Diabetes mellitus.  3.  Postoperative anemia with recommendations to recheck CBC 1 week      following discharge to assess stabilization/improvement of hemoglobin.  4.  Hypokalemia currently repleting p.o.  5.  Mild leukocytosis.  6.  Low albumin 1.8 noted prior to discharge.   DISCHARGE MEDICATIONS:  The patient can slowly resume her diabetes  medications.  1.  Metformin 500 mg twice daily, she can resume this once her oral intake      has returned to her baseline, perhaps 1 week.  2.  She should continue sliding scale with NovoLog with q.a.c. CBGs with      t.i.d. coverage.  3.  Cholestyramine as she was on prior to transfer.  4.  Artificial Tears as before.  5.  Milk of Magnesia 30 mL p.r.n. constipation.  6.  Tylenol 25 mg p.o. q.6 p.r.n. nausea.  7.  Tylenol 325 mg one or two every 4 hours as needed.  8.  Ativan 0.5  mg t.i.d. p.r.n. anxiety.  9.  She should continue nutritional supplements as her albumin was quite low      and she exhibited malnutrition during her course here.  10. K-Dur 40 mEq p.o. x1 daily for the next 3 days.  11. Augmentin 500 mg p.o. b.i.d. x1 week.   DISPOSITION:  She should have a CBC and basic metabolic panel performed 1  week following discharge.  [name] should see Dr. Carolynne Edouard in 1 week following  discharge for appointment on November 7, at 11:30.  She should call [phone]  for rescheduling.  She should follow up with Dr. Madilyn Fireman in 3-4 weeks  following hospital discharge, [phone] for scheduling.   HISTORY OF PRESENT ILLNESS:  For full details, please refer to the history  and physical as per [name], M.D.  However,  briefly, [name] is a  pleasant 76 year old female who presented with a 5-day history of nausea,  and feeling sick.  Staff had reported jaundice.  She was evaluated in the  emergency department.   HOSPITAL COURSE:  The patient underwent GI and surgical consultation with a  picture of obstructive jaundice with common bile duct stones.  She underwent  ERCP by Dr. Madilyn Fireman for stent placement, sphincterotomy, and stone extraction.  Subsequently underwent open cholecystectomy with good results.   Her course was that of improvement.  Her jaundice improved and her LFTs came  to baseline.  She did continue to have some nausea, but no vomiting with  meals.  She had clinically resolved ileus.  She was seen by general Ford  on day of discharge with anticipated plans for outpatient follow-up and  routine management of this biliary stent.   LABORATORY DATA:  Sodium 136, potassium 3.3, BUN 5, creatinine 0.8.  LFTs  prior to discharge; her alkaline phosphatase was mildly elevated at 337,  total bilirubin at 3.0, AST/ALT 26/41, albumin 1.8.      Hettie Holstein, D.O.  Electronically Signed     ESS/MEDQ  D:  08/28/2005  T:  08/28/2005  Job:  [id number]   cc:   Ollen Gross. Vernell Morgans, M.D.  [address]  [place]  [place] [address]   John C. Madilyn Fireman, M.D.  Fax: [phone]

## 2011-03-15 NOTE — Op Note (Signed)
[name], [name]                ACCOUNT NO.:  [id number]   MEDICAL RECORD NO.:  [id number]          PATIENT TYPE:  INP   LOCATION:  5036                         FACILITY:  [facility]   PHYSICIAN:  Ollen Gross. Vernell Morgans, M.D. DATE OF BIRTH:  14-Oct-1935   DATE OF PROCEDURE:  08/24/2005  DATE OF DISCHARGE:                                 OPERATIVE REPORT   PREOPERATIVE DIAGNOSIS:  Cholecystitis with cholelithiasis.   POSTOPERATIVE DIAGNOSIS:  Cholecystitis with cholelithiasis with common bile  duct stones.   PROCEDURE:  Laparoscopic cholecystectomy with intraoperative cholangiogram,  which was aborted, and subsequent open cholecystectomy with common bile duct  exploration.   SURGEON:  Ollen Gross. Carolynne Edouard, M.D.   ASSISTANT:  Currie Paris, M.D.   ANESTHESIA:  General endotracheal.   PROCEDURE:  After informed consent was obtained, the patient was brought to  the operating room and placed in a supine position on the operating table.  After adequate induction of general anesthesia, the patient's abdomen was  prepped with Betadine and draped in the usual sterile manner.  The area  below the umbilicus was infiltrated with 0.25% Marcaine.  A small incision  was made with a 15 blade knife.  This incision was carried down through the  subcutaneous tissue bluntly with a hemostat and [employer] retractors until  the linea alba was identified.  The linea alba was incised with a 15 blade  knife.  Each side was grasped with Kocher clamps and elevated anteriorly.  The preperitoneal space was then probed bluntly with a hemostat until the  peritoneum was opened and access was gained to the abdominal cavity.  A 0  Vicryl pursestring stitch was placed in the fascia surrounding the opening.  An Hasson cannula was placed through the opening and anchored in place with  the previously placed Vicryl pursestring stitch.  The abdomen was then  insufflated with carbon dioxide without difficulty.  The patient was  placed  in a head-up position and rotated slightly with the right side up.  Next,  the laparoscope was then inserted through the Hasson cannula and the right  upper quadrant was inspected.  The gallbladder appeared to be very inflamed.  Next, the epigastrium region was infiltrated 0.25% Marcaine and a small  incision was made with a 15 blade knife and a 10-mm port was placed bluntly  through this incision into the abdominal cavity under direct vision.  Sites  were then chosen laterally on the right side of the abdomen for placement of  5-mm ports.  Each of these areas were infiltrated with 0.25% Marcaine.  Small stab incisions were made with a 25 blade knife.  The 5- mm ports were  then placed bluntly through these incisions into the abdominal cavity under  direct vision.  A blunt grasper was placed through the lateral-most 5-mm  port and used to grasp the dome of gallbladder and elevate it anteriorly and  superiorly.  A dissector was placed through the epigastric port and using  the electrocautery and blunt dissection, omental adhesions to the  gallbladder were taken down.  It appeared  as though the duodenum was fairly  close to the gallbladder and near the neck of the gallbladder and this was  taken down sharply with the laparoscopic scissors.  Once this was  accomplished, another blunt grasper was placed through the other 5-mm  port  and used to retract on the body and neck of the gallbladder.  A dissector  was placed through the epigastric port and blunt dissection in the area of  the gallbladder neck was performed.  The cystic artery was identified first  and appeared to be anterior to the cystic duct.  This was dissected bluntly  in a circumferential manner until a good window was created.  Two clips were  placed proximally and 1 distally on the artery and the artery was divided  the two.  Posterior to this, the cystic duct was identified in the process  of blunt dissection.  This  area was very inflamed.  In the process of blunt  dissection, a small hole was made near the bottom of the cystic duct.  A 14-  gauge Angiocath was then placed percutaneously through the anterior bowel  wall under direct vision.  A Reddick cholangiogram catheter was placed  through the Angiocath and flushed.  The Reddick catheter then placed through  this opening that was created and a cholangiogram was obtained that showed  this opening to be at the junction of the cystic and common duct, and the  common duct was very dilated with many large stone was still in place.  The  patient had undergone ERCP the day before and a stent was left in the common  duct; this was also visualized on the fluoro.  At this point, the  laparoscopic portion of the procedure was stopped.  A right subcostal  incision was then made with a 10 blade knife.  This incision was carried  down through the skin and subcutaneous tissue sharply using the  electrocautery.  The rectus muscles and fascia were also opened sharply with  the electrocautery as well as the peritoneum, all done with electrocautery,  until access was gained to the abdominal cavity.  A Bookwalter retractor was  then deployed.  Once the edge of the liver was retracted superiorly and the  intestines inferiorly, this allowed good visualization of the gallbladder.  The gallbladder was removed from the liver using a top-down dissection  technique with electrocautery.  Once down to the cystic duct, the opening  that had been created could be identified very easily.  This opening at the  common duct was extended inferiorly and stone graspers were used to evacuate  the common duct both proximally and distally of the retained common duct  stones.  A 5 Fogarty catheter was also used to pass proximally and distally  in the common duct to remove any further stone debris.  Once this was  completely accomplished and the duct was clear of any debris, the  common ductotomy was repaired with interrupted 4-0 PDS sutures.  A T tube was not  placed since the stent was already in place in the common duct and common  duct was so dilated from the previous obstruction of the stones.  At this  point the wound was irrigated copious amounts of saline.  The liver bed  where the gallbladder was was fulgurated with the electrocautery and was  found to be hemostatic.  Some Tisseel was placed on the repair of the common  duct and a 19-French round Blake drain was  then brought in through the  abdominal wall through the lateral-most port site and the drain was left  just outside the area of repair of the common duct.  This drain was anchored  to the skin with 3-0 nylon stitch.  At this point the fascia of the anterior  abdominal wall was closed in 2 layers with running #1 PDS suture.  The wound  was irrigated copious amounts of saline and the skin was closed with  staples.  Sterile dressings were applied.  The patient tolerated the  procedure well.  At the end of the case, all needle, sponge and instrument  counts were correct.  The patient was then awakened and taken to the  recovery room in stable condition.      Ollen Gross. Vernell Morgans, M.D.  Electronically Signed     PST/MEDQ  D:  08/27/2005  T:  08/28/2005  Job:  [id number]

## 2011-04-01 ENCOUNTER — Other Ambulatory Visit: Payer: Medicare Other

## 2011-04-02 ENCOUNTER — Other Ambulatory Visit: Payer: Medicare Other

## 2011-04-02 DIAGNOSIS — Z7901 Long term (current) use of anticoagulants: Secondary | ICD-10-CM

## 2011-04-02 LAB — PROTIME-INR: Prothrombin Time: 25.8 seconds — ABNORMAL HIGH (ref 11.6–15.2)

## 2011-04-03 ENCOUNTER — Telehealth: Payer: Self-pay

## 2011-04-03 NOTE — Telephone Encounter (Signed)
Left message for deb that blood work ok continue on present med recheck in 1 month

## 2011-04-09 ENCOUNTER — Telehealth: Payer: Self-pay | Admitting: Family Medicine

## 2011-04-09 NOTE — Telephone Encounter (Signed)
She can take TUMS regularly and if no benefit then make an appointment

## 2011-04-09 NOTE — Telephone Encounter (Signed)
Told lady that answered phone that ok to take tums but if no better for her to come in

## 2011-04-09 NOTE — Telephone Encounter (Signed)
Please call pt c/o of upset stomach x 1 week wants to know if ok to give tums or what can they give her or do you need to see her

## 2011-04-16 ENCOUNTER — Other Ambulatory Visit: Payer: Self-pay | Admitting: Family Medicine

## 2011-04-29 ENCOUNTER — Other Ambulatory Visit: Payer: Medicare Other

## 2011-04-29 DIAGNOSIS — Z7901 Long term (current) use of anticoagulants: Secondary | ICD-10-CM

## 2011-04-29 LAB — PROTIME-INR: Prothrombin Time: 22.7 seconds — ABNORMAL HIGH (ref 11.6–15.2)

## 2011-04-30 ENCOUNTER — Telehealth: Payer: Self-pay

## 2011-04-30 NOTE — Telephone Encounter (Signed)
[facility]ed [facility] talked with Reece Levy they will continue and recheck in 1 mth

## 2011-05-07 ENCOUNTER — Ambulatory Visit: Payer: Medicare Other | Admitting: Family Medicine

## 2011-05-08 ENCOUNTER — Other Ambulatory Visit: Payer: Self-pay | Admitting: Family Medicine

## 2011-05-09 ENCOUNTER — Other Ambulatory Visit: Payer: Self-pay | Admitting: Family Medicine

## 2011-05-09 DIAGNOSIS — Z1231 Encounter for screening mammogram for malignant neoplasm of breast: Secondary | ICD-10-CM

## 2011-05-15 ENCOUNTER — Ambulatory Visit: Payer: Medicare Other | Admitting: Family Medicine

## 2011-05-20 ENCOUNTER — Other Ambulatory Visit (HOSPITAL_COMMUNITY): Payer: Self-pay | Admitting: Oncology

## 2011-05-20 ENCOUNTER — Encounter (HOSPITAL_BASED_OUTPATIENT_CLINIC_OR_DEPARTMENT_OTHER): Payer: Medicare Other | Admitting: Oncology

## 2011-05-20 DIAGNOSIS — D649 Anemia, unspecified: Secondary | ICD-10-CM

## 2011-05-20 DIAGNOSIS — D696 Thrombocytopenia, unspecified: Secondary | ICD-10-CM

## 2011-05-20 LAB — COMPREHENSIVE METABOLIC PANEL
ALT: 8 U/L (ref 0–35)
Albumin: 3.5 g/dL (ref 3.5–5.2)
CO2: 30 mEq/L (ref 19–32)
Calcium: 9.7 mg/dL (ref 8.4–10.5)
Chloride: 98 mEq/L (ref 96–112)
Creatinine, Ser: 1.62 mg/dL — ABNORMAL HIGH (ref 0.50–1.10)
Potassium: 4.6 mEq/L (ref 3.5–5.3)
Sodium: 134 mEq/L — ABNORMAL LOW (ref 135–145)
Total Protein: 6.9 g/dL (ref 6.0–8.3)

## 2011-05-20 LAB — CBC WITH DIFFERENTIAL/PLATELET
Eosinophils Absolute: 0.1 10*3/uL (ref 0.0–0.5)
LYMPH%: 20.1 % (ref 14.0–49.7)
MONO#: 0.5 10*3/uL (ref 0.1–0.9)
Platelets: 108 10*3/uL — ABNORMAL LOW (ref 145–400)
RBC: 3.72 10*6/uL (ref 3.70–5.45)
lymph#: 0.9 10*3/uL (ref 0.9–3.3)

## 2011-05-20 LAB — FERRITIN: Ferritin: 191 ng/mL (ref 10–291)

## 2011-05-20 LAB — LACTATE DEHYDROGENASE: LDH: 170 U/L (ref 94–250)

## 2011-05-20 LAB — SEDIMENTATION RATE: Sed Rate: 27 mm/hr — ABNORMAL HIGH (ref 0–22)

## 2011-05-24 ENCOUNTER — Encounter: Payer: Self-pay | Admitting: Family Medicine

## 2011-05-24 ENCOUNTER — Ambulatory Visit (INDEPENDENT_AMBULATORY_CARE_PROVIDER_SITE_OTHER): Payer: Medicare Other | Admitting: Family Medicine

## 2011-05-24 DIAGNOSIS — I152 Hypertension secondary to endocrine disorders: Secondary | ICD-10-CM

## 2011-05-24 DIAGNOSIS — E1169 Type 2 diabetes mellitus with other specified complication: Secondary | ICD-10-CM

## 2011-05-24 DIAGNOSIS — I10 Essential (primary) hypertension: Secondary | ICD-10-CM

## 2011-05-24 DIAGNOSIS — I428 Other cardiomyopathies: Secondary | ICD-10-CM

## 2011-05-24 DIAGNOSIS — I739 Peripheral vascular disease, unspecified: Secondary | ICD-10-CM

## 2011-05-24 DIAGNOSIS — E119 Type 2 diabetes mellitus without complications: Secondary | ICD-10-CM

## 2011-05-24 DIAGNOSIS — K219 Gastro-esophageal reflux disease without esophagitis: Secondary | ICD-10-CM

## 2011-05-24 NOTE — Patient Instructions (Signed)
Continue on present medications recheck here in 4 months

## 2011-05-24 NOTE — Progress Notes (Signed)
  Subjective:    Patient ID: [name], female    DOB: [birth year], 76 y.o.   MRN: [id number]  HPI She is here for an interval evaluation. Her medications were reviewed. She is having no difficulty with them. She does get PT/INR on a monthly basis where she lives. Her blood sugars run in the low 100s. She's had no chest pain, shortness of breath,DOE. Neither she nor her caregiver have any other concerns.   Review of Systems     Objective:   Physical Exam Alert and in no distress. Hemoglobin A1c 6.2       Assessment & Plan:  Diabetes. PVD. Cardiomyopathy. Hypertension. GERD. Cheating on present medication regimen. Recheck here in 4 months

## 2011-05-30 ENCOUNTER — Ambulatory Visit (INDEPENDENT_AMBULATORY_CARE_PROVIDER_SITE_OTHER): Payer: Medicare Other | Admitting: Medical

## 2011-05-30 ENCOUNTER — Encounter: Payer: Self-pay | Admitting: Medical

## 2011-05-30 VITALS — BP 120/60 | HR 71 | Temp 97.8°F | Wt 207.0 lb

## 2011-05-30 DIAGNOSIS — N390 Urinary tract infection, site not specified: Secondary | ICD-10-CM

## 2011-05-30 LAB — POCT URINALYSIS DIPSTICK
Glucose, UA: NEGATIVE
Ketones, UA: NEGATIVE
Spec Grav, UA: 1.015

## 2011-05-30 MED ORDER — NITROFURANTOIN MONOHYD MACRO 100 MG PO CAPS: 100.0000 mg | ORAL_CAPSULE | Freq: Two times a day (BID) | ORAL | Status: AC

## 2011-05-30 NOTE — Progress Notes (Signed)
Subjective:    [name] is a 76 y.o. female who complains of burning with urination.   Here today with group home representative. She has had symptoms for 4 days.  Patient denies back pain, fever and belly pain or vaginal c/o.Marland Kitchen Patient does have a history of recurrent UTI.  She is on Coumadin.  Patient does not have a history of pyelonephritis.   The following portions of the patient's history were reviewed and updated as appropriate: allergies, current medications, past family history, past medical history, past social history, past surgical history and problem list.  Past Medical History  Diagnosis Date  . Anemia   . PVD (peripheral vascular disease)   . Hypertension   . Asthma   . Chronic kidney disease   . Diabetes mellitus   . GERD (gastroesophageal reflux disease)   . Depression   . Mild mental retardation   . COPD (chronic obstructive pulmonary disease)   . DVT (deep venous thrombosis)     Review of Systems  Constitutional: denies fever, chills, sweats Cardiology: denies chest pain, palpitations Respiratory: denies cough, shortness of breath Gastroenterology: denies abdominal pain, nausea, vomiting, diarrhea Urology: denies hematuria, incontinence    Objective:    Filed Vitals:   05/30/11 1340  BP: 120/60  Pulse: 71  Temp: 97.8 F (36.6 C)    General appearance: alert, no distress, WD/WN, female Oral cavity: MMM Heart: RRR, normal S1, S2, no murmurs Lungs: CTA bilaterally, no wheezes, rhonchi, or rales Abdomen: +bs, soft, non tender, non distended, no masses, no hepatomegaly, no splenomegaly, no bruits Back: no CVA tenderness Pulses: 2+ symmetric       Assessment:   Encounter Diagnosis  Name Primary?  . UTI (lower urinary tract infection) Yes     Plan:   Reviewed UA which was abnormal.  Will send for urine culture.  Reviewed prior visit history or +recurrent UTI, has done well on cephalosporins and Macrobid.  Advised she hydrate well with  water, begin Macrobid empirically for UTI, and we will call with culture results.

## 2011-05-31 ENCOUNTER — Other Ambulatory Visit: Payer: Self-pay | Admitting: Family Medicine

## 2011-06-02 LAB — URINE CULTURE: Colony Count: 50000

## 2011-06-03 ENCOUNTER — Telehealth: Payer: Self-pay | Admitting: *Deleted

## 2011-06-03 NOTE — Telephone Encounter (Addendum)
Message copied by Dorthula Perfect on Mon Jun 03, 2011  2:49 PM ------      Message from: Aleen Campi, DAVID S      Created: Mon Jun 03, 2011  7:55 AM       Culture of urine shows multiple colonies.  See if she is feeling better?  If so, finish out the Macrobid, drink plenty of water daily.   Informed pt's caregive of urine culture.  Caregiver states that pt is feeling better and has no complaints at this time.  Will finish antibiotic and will drink plenty of water daily.  CM, LPN

## 2011-06-13 ENCOUNTER — Ambulatory Visit (INDEPENDENT_AMBULATORY_CARE_PROVIDER_SITE_OTHER): Payer: Medicare Other | Admitting: Internal Medicine

## 2011-06-13 ENCOUNTER — Encounter: Payer: Self-pay | Admitting: Internal Medicine

## 2011-06-13 DIAGNOSIS — R079 Chest pain, unspecified: Secondary | ICD-10-CM

## 2011-06-13 DIAGNOSIS — I10 Essential (primary) hypertension: Secondary | ICD-10-CM

## 2011-06-13 DIAGNOSIS — Z01818 Encounter for other preprocedural examination: Secondary | ICD-10-CM

## 2011-06-13 NOTE — Progress Notes (Signed)
HPI Patient is being evaluated for eye surgery.  Living in group home. No complaints of chest pain. Goes to day program.  Does chair exercise.  Walkis 1/3 of a milE at most. Patient had an echo in 2008 that showed mild LVH with LVEF of 55% Allergies  Allergen Reactions  . Levaquin     Current Outpatient Prescriptions  Medication Sig Dispense Refill  . ACCU-CHEK AVIVA PLUS test strip CHECK BLOOD GLUCOSE (SUGAR) 1 OR 2 TIMESDAILY  100 strip  prn  . buPROPion (WELLBUTRIN XL) 150 MG 24 hr tablet Take 150 mg by mouth daily.        Marland Kitchen DIOVAN 80 MG tablet TAKE ONE TABLET EVERY MORNING  30 tablet  6  . divalproex (DEPAKOTE) 125 MG EC tablet Take 125 mg by mouth 3 (three) times daily. One tab@8am  and two tabs @8pm        . docusate sodium (COLACE) 100 MG capsule Take 100 mg by mouth 2 (two) times daily.        . ferrous sulfate 325 (65 FE) MG tablet Take 325 mg by mouth daily with breakfast.        . FLUoxetine (PROZAC) 10 MG capsule Take 10 mg by mouth daily.        Marland Kitchen FLUoxetine (PROZAC) 40 MG capsule Take 40 mg by mouth daily.        Marland Kitchen ibandronate (BONIVA) 150 MG tablet Take 150 mg by mouth every 30 (thirty) days. Take in the morning with a full glass of water, on an empty stomach, and do not take anything else by mouth or lie down for the next 30 min.       . isosorbide mononitrate (IMDUR) 30 MG 24 hr tablet Take 30 mg by mouth daily.        . Metoclopramide HCl 5 MG TBDP Take 1 tablet by mouth 2 (two) times daily.        . metoprolol tartrate (LOPRESSOR) 25 MG tablet TAKE ONE TABLET TWICE DAILY  60 tablet  6  . montelukast (SINGULAIR) 10 MG tablet Take 10 mg by mouth at bedtime.        . nitroGLYCERIN (NITROSTAT) 0.4 MG SL tablet Place 0.4 mg under the tongue every 5 (five) minutes as needed.        Marland Kitchen omeprazole (PRILOSEC) 20 MG capsule TAKE ONE CAPSULE EVERY MORNING  30 capsule  prn  . simvastatin (ZOCOR) 20 MG tablet Take 20 mg by mouth at bedtime.        . traMADol (ULTRAM) 50 MG tablet Take  50 mg by mouth every 6 (six) hours as needed.        . warfarin (COUMADIN) 5 MG tablet Take 5 mg by mouth 2 (two) times daily.          Past Medical History  Diagnosis Date  . Anemia   . PVD (peripheral vascular disease)   . Hypertension   . Asthma   . Chronic kidney disease   . Diabetes mellitus   . GERD (gastroesophageal reflux disease)   . Depression   . Mild mental retardation   . COPD (chronic obstructive pulmonary disease)   . DVT (deep venous thrombosis)     Past Surgical History  Procedure Date  . Cholecystectomy   . Abdominal hysterectomy   . Endoscopic retrograde cholangiopancreatography w/ sphincterotomy and stone removal     No family history on file.  History   Social History  . Marital Status: Single  Spouse Name: N/A    Number of Children: N/A  . Years of Education: N/A   Occupational History  . Not on file.   Social History Main Topics  . Smoking status: Former Smoker    Types: Cigarettes    Quit date: 10/29/1999  . Smokeless tobacco: Never Used  . Alcohol Use: Not on file  . Drug Use: Not on file  . Sexually Active: Not on file   Other Topics Concern  . Not on file   Social History Narrative  . No narrative on file    Review of Systems:  All systems reviewed.  They are negative to the above problem except as previously stated.  Vital Signs: BP 130/56  Pulse 61  Ht 5\' 11"  (1.803 m)  Wt 204 lb (92.534 kg)  BMI 28.45 kg/m2  Physical Exam  Patient is in NAD  HEENT:  Normocephalic, atraumatic. EOMI, PERRLA.  Neck: JVP is normal. No thyromegaly. No bruits.  Lungs: clear to auscultation. No rales no wheezes.  Heart: Regular rate and rhythm. Normal S1, S2. No S3.   No significant murmurs. PMI not displaced.  Abdomen:  Supple, nontender. Normal bowel sounds. No masses. No hepatomegaly.  Extremities:   Good distal pulses throughout. No lower extremity edema.  Musculoskeletal :moving all extremities.  Neuro:   alert and oriented x3.   CN II-XII grossly intact.  EKG:  NSR.  61 bpm.  Incomp RBB.  LAD.  LVH.  Assessment and Plan:

## 2011-06-17 ENCOUNTER — Telehealth: Payer: Self-pay | Admitting: Family Medicine

## 2011-06-18 ENCOUNTER — Telehealth: Payer: Self-pay | Admitting: Internal Medicine

## 2011-06-18 NOTE — Telephone Encounter (Signed)
Approved by [employer]

## 2011-06-18 NOTE — Telephone Encounter (Signed)
Pt was seen last week and needs to have eye surgery and the eye ctr doesn't need the letter of clearance but they need it faxed to surgery ctr the fax# [phone] attn : Jodie the pre-op nurse

## 2011-06-18 NOTE — Telephone Encounter (Signed)
Pt will be having eye surgery on 07/24/11.  She needs surgical clearance faxed to the surgery center Fax# [phone] Aware Dr. Tenny Craw' nurse [name] will be back in the office tomorrow. Mylo Red RN

## 2011-06-19 ENCOUNTER — Other Ambulatory Visit: Payer: Medicare Other

## 2011-06-19 DIAGNOSIS — Z7901 Long term (current) use of anticoagulants: Secondary | ICD-10-CM

## 2011-06-19 DIAGNOSIS — E7800 Pure hypercholesterolemia, unspecified: Secondary | ICD-10-CM

## 2011-06-19 LAB — PROTIME-INR: INR: 1.89 — ABNORMAL HIGH (ref <1.50)

## 2011-06-20 ENCOUNTER — Other Ambulatory Visit: Payer: Self-pay

## 2011-06-20 LAB — LIPID PANEL
Total CHOL/HDL Ratio: 2.7 Ratio
VLDL: 14 mg/dL (ref 0–40)

## 2011-06-20 MED ORDER — WARFARIN SODIUM 1 MG PO TABS: 1.0000 mg | ORAL_TABLET | Freq: Every day | ORAL | Status: DC

## 2011-06-20 MED ORDER — WARFARIN SODIUM 5 MG PO TABS: 5.0000 mg | ORAL_TABLET | Freq: Every day | ORAL | Status: DC

## 2011-06-21 ENCOUNTER — Telehealth: Payer: Self-pay

## 2011-06-21 NOTE — Telephone Encounter (Signed)
Talked with deb will write Rx for 1 mg so she will take a total of 11 mg for 1 week and recheck here in 1 week

## 2011-06-21 NOTE — Telephone Encounter (Signed)
Called to speak with [name] she said female dr changed her to 2 5mg  tabs a day

## 2011-06-21 NOTE — Telephone Encounter (Signed)
Review of her record indicates she is actually on 10 mg. I will increase her to 11 mg.

## 2011-06-21 NOTE — Telephone Encounter (Signed)
[name] FROM [facility] NEEDS A NOTE WITH YOU SIG TO STATE REDUCING ESTERS COUMIDIM TO 7  MG FROM 10 MG

## 2011-06-24 ENCOUNTER — Telehealth: Payer: Self-pay | Admitting: Internal Medicine

## 2011-06-24 ENCOUNTER — Ambulatory Visit
Admission: RE | Admit: 2011-06-24 | Discharge: 2011-06-24 | Disposition: A | Discharge status: Home / Self Care | Payer: Medicare Other | Source: Ambulatory Visit | Attending: Family Medicine | Admitting: Family Medicine

## 2011-06-24 DIAGNOSIS — Z1231 Encounter for screening mammogram for malignant neoplasm of breast: Secondary | ICD-10-CM

## 2011-06-24 NOTE — Telephone Encounter (Signed)
Walk In Pt Form " pt dropped off papers for nurse" sent to [facility]  06/24/11/km

## 2011-06-26 DIAGNOSIS — I10 Essential (primary) hypertension: Secondary | ICD-10-CM | POA: Insufficient documentation

## 2011-06-26 DIAGNOSIS — Z01818 Encounter for other preprocedural examination: Secondary | ICD-10-CM | POA: Insufficient documentation

## 2011-06-26 NOTE — Assessment & Plan Note (Signed)
Adequate control. 

## 2011-06-26 NOTE — Assessment & Plan Note (Signed)
Patient is a 76 year old with HTN.  She is being evaluated for eye surgery.  She does not have any symptoms to suggest angina.  Exam without evid of CHF.  EKG with SR. Overall I think she is at low risk for planned eye surgery.  OK to proceed without furhter testing.

## 2011-06-27 NOTE — Telephone Encounter (Signed)
Called Vista Deck at [facility] at [phone] and advised that the surgical clearance was faxed to Jodie at [phone]. [name] will pick up the form that Dr.Ross completed and the office note consult today.

## 2011-06-28 ENCOUNTER — Other Ambulatory Visit: Payer: Medicare Other

## 2011-06-28 ENCOUNTER — Telehealth: Payer: Self-pay | Admitting: *Deleted

## 2011-06-28 DIAGNOSIS — Z7901 Long term (current) use of anticoagulants: Secondary | ICD-10-CM

## 2011-06-28 LAB — PROTIME-INR
INR: 2.11 — ABNORMAL HIGH (ref <1.50)
Prothrombin Time: 23.8 seconds — ABNORMAL HIGH (ref 11.6–15.2)

## 2011-06-28 NOTE — Telephone Encounter (Addendum)
Message copied by Dorthula Perfect on Fri Jun 28, 2011  4:04 PM ------      Message from: Jac Canavan      Created: Fri Jun 28, 2011  2:51 PM       Call and advise that Coumadin lab, INR looks ok.   Verify she is on 11mg  daily.  From what I see in the prior correspondence, this appears to be her dose.  If so, c/t 11mg  daily, recheck INR in 2 wk.    Notified pt of lab results and is on coumadin 11 mg daily.  Pt will return in 2 weeks to recheck PT/INR.  CM, LPN

## 2011-07-09 ENCOUNTER — Telehealth: Payer: Self-pay | Admitting: Family Medicine

## 2011-07-09 MED ORDER — LORATADINE 10 MG PO TBDP: 10.0000 mg | ORAL_TABLET | Freq: Every day | ORAL | Status: DC

## 2011-07-09 NOTE — Telephone Encounter (Signed)
Loratadine called in

## 2011-07-12 ENCOUNTER — Other Ambulatory Visit (INDEPENDENT_AMBULATORY_CARE_PROVIDER_SITE_OTHER): Payer: Medicare Other

## 2011-07-12 DIAGNOSIS — Z7901 Long term (current) use of anticoagulants: Secondary | ICD-10-CM

## 2011-07-12 DIAGNOSIS — Z23 Encounter for immunization: Secondary | ICD-10-CM

## 2011-07-12 DIAGNOSIS — Z79899 Other long term (current) drug therapy: Secondary | ICD-10-CM

## 2011-07-12 NOTE — Progress Notes (Signed)
Advised pt to stay on same meds and reck here in 1 month

## 2011-07-15 ENCOUNTER — Telehealth: Payer: Self-pay

## 2011-07-15 NOTE — Telephone Encounter (Signed)
Called talked with lindsey continue and recheck in1 mth

## 2011-07-18 ENCOUNTER — Other Ambulatory Visit: Payer: Self-pay | Admitting: Family Medicine

## 2011-07-30 ENCOUNTER — Encounter: Payer: Self-pay | Admitting: Medical

## 2011-07-30 ENCOUNTER — Ambulatory Visit (INDEPENDENT_AMBULATORY_CARE_PROVIDER_SITE_OTHER): Payer: Medicare Other | Admitting: Medical

## 2011-07-30 VITALS — BP 120/68 | HR 60 | Temp 97.6°F | Resp 18 | Ht 71.0 in | Wt 209.0 lb

## 2011-07-30 DIAGNOSIS — F329 Major depressive disorder, single episode, unspecified: Secondary | ICD-10-CM

## 2011-07-30 DIAGNOSIS — M25569 Pain in unspecified knee: Secondary | ICD-10-CM

## 2011-07-30 DIAGNOSIS — K219 Gastro-esophageal reflux disease without esophagitis: Secondary | ICD-10-CM | POA: Insufficient documentation

## 2011-07-30 DIAGNOSIS — Z0000 Encounter for general adult medical examination without abnormal findings: Secondary | ICD-10-CM

## 2011-07-30 DIAGNOSIS — I10 Essential (primary) hypertension: Secondary | ICD-10-CM | POA: Insufficient documentation

## 2011-07-30 DIAGNOSIS — M25561 Pain in right knee: Secondary | ICD-10-CM

## 2011-07-30 DIAGNOSIS — E119 Type 2 diabetes mellitus without complications: Secondary | ICD-10-CM | POA: Insufficient documentation

## 2011-07-30 DIAGNOSIS — I428 Other cardiomyopathies: Secondary | ICD-10-CM

## 2011-07-30 DIAGNOSIS — Z7189 Other specified counseling: Secondary | ICD-10-CM

## 2011-07-30 DIAGNOSIS — N189 Chronic kidney disease, unspecified: Secondary | ICD-10-CM | POA: Insufficient documentation

## 2011-07-30 DIAGNOSIS — D649 Anemia, unspecified: Secondary | ICD-10-CM | POA: Insufficient documentation

## 2011-07-30 DIAGNOSIS — I429 Cardiomyopathy, unspecified: Secondary | ICD-10-CM | POA: Insufficient documentation

## 2011-07-30 LAB — POCT URINALYSIS DIPSTICK
Bilirubin, UA: NEGATIVE
Ketones, UA: NEGATIVE
Spec Grav, UA: 1.01
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
## 2011-08-01 ENCOUNTER — Encounter: Payer: Self-pay | Admitting: Medical

## 2011-08-01 NOTE — Patient Instructions (Signed)
Recommendations:  Currently she seems to be doing well, just recently had eye surgery and is due back for eye doctor follow up soon.    Her next appointment here is around 08/11/11 for repeat PT/INR for coumadin monitoring.  Her next regular office visit is 3 months.  Overall, I recommend she try and get some activity/exercise such as walking, chair exercise, etc.  I recommend she follow a low fat, diabetic diet.  Her diabetes numbers are good, so I believe she has been eating relatively healthy.    I asked the caregiver to check on any records at the Group Home regarding prior shingles vaccine and tetanus booster.  If no record found, I would recommend we give her the shingles and tetanus boosters.  You will need to call her insurance regarding coverage for the shingles vaccine though.  Or, call back and speak with our office about coverage verus cost of the shingles vaccine.  Otherwise, continue her same medications, please review your records to compare with my note to verify past medical history, medications, allergies, etc.  Sincerely,   Kristian Covey, PA-C

## 2011-08-16 ENCOUNTER — Telehealth: Payer: Self-pay | Admitting: Medical

## 2011-08-16 ENCOUNTER — Other Ambulatory Visit: Payer: Self-pay | Admitting: Medical

## 2011-08-16 ENCOUNTER — Other Ambulatory Visit (INDEPENDENT_AMBULATORY_CARE_PROVIDER_SITE_OTHER): Payer: Medicare Other

## 2011-08-16 DIAGNOSIS — R350 Frequency of micturition: Secondary | ICD-10-CM

## 2011-08-16 DIAGNOSIS — Z23 Encounter for immunization: Secondary | ICD-10-CM

## 2011-08-16 DIAGNOSIS — I82409 Acute embolism and thrombosis of unspecified deep veins of unspecified lower extremity: Secondary | ICD-10-CM

## 2011-08-16 LAB — POCT URINALYSIS DIPSTICK
Leukocytes, UA: POSITIVE
Nitrite, UA: NEGATIVE
Urobilinogen, UA: NEGATIVE

## 2011-08-16 LAB — PROTIME-INR
INR: 2.85 — ABNORMAL HIGH (ref <1.50)
Prothrombin Time: 30.2 seconds — ABNORMAL HIGH (ref 11.6–15.2)

## 2011-08-16 NOTE — Telephone Encounter (Signed)
pls call pharmacy.  Msg requested Metoclopramide refill, but I refilled in August.  Pls refill x 5

## 2011-08-16 NOTE — Progress Notes (Signed)
Patient was notified of results and the caregiver said that the patient takes 11mg  of coumadin. She take 2 5 mg and 1-1mg  at night every night, CLS

## 2011-08-16 NOTE — Progress Notes (Signed)
Urine was sent for culture. CLS

## 2011-08-16 NOTE — Progress Notes (Signed)
Patient takes 2-5mg  of coumadin and 1-1mg  at night every night for a total of 11 mg. CLS

## 2011-08-19 NOTE — Telephone Encounter (Signed)
Rx for Metoclopramide 5 mg x 5 refills per [facility] [employer]. CLS

## 2011-08-20 ENCOUNTER — Other Ambulatory Visit: Payer: Self-pay | Admitting: Medical

## 2011-08-20 ENCOUNTER — Telehealth: Payer: Self-pay | Admitting: Medical

## 2011-08-20 MED ORDER — NITROFURANTOIN MONOHYD MACRO 100 MG PO CAPS: 100.0000 mg | ORAL_CAPSULE | Freq: Two times a day (BID) | ORAL | Status: AC

## 2011-08-20 NOTE — Telephone Encounter (Signed)
i thought macrobid had been sent.  I resent Macrobid just now to [facility].  Pls call and have her begin the Macrobid today, get 2 doses in today, now and at bedtime.  Call/return if not improving.

## 2011-08-20 NOTE — Telephone Encounter (Signed)
Saralyn Pilar caregiver was notified that the rx was ready. CLS

## 2011-08-20 NOTE — Telephone Encounter (Signed)
SHANE I THOUGHT YOUR MESSAGE SAID THAT YOU SENT THE RX OVER FOR THE UTI THAT THIS PATIENT HAD. LET ME KNOW. CLS

## 2011-08-26 ENCOUNTER — Telehealth: Payer: Self-pay | Admitting: Family Medicine

## 2011-08-26 NOTE — Telephone Encounter (Signed)
Set her up to see an orthopedic surgeon for her knee pain

## 2011-08-26 NOTE — Telephone Encounter (Signed)
Let the nurse know that I would rather have the orthopedic surgeon handle this.

## 2011-08-26 NOTE — Telephone Encounter (Signed)
[name] called re:  [name] is almost out of the Tramadol HCL 50 mg you gave for knee pain but it is not really helping. She complains of knee pain daily. ? Different medication Also she needs written authorization to use some type of topical muscle rub on her, even if OTC she has to have an order  [name]  Please call [name] and let her know what is going to be done

## 2011-08-26 NOTE — Telephone Encounter (Signed)
[name] with [facility] called and stated that pt is still complaining of knee pain. She states she would like for her to have a different pain rx.  She states that pt says tramadol is not working. [name] would also like orders to put something on it topically. Pt uses [facility].

## 2011-08-29 NOTE — Telephone Encounter (Signed)
Had [name] schedule for November 9, at 9:00am for [facility] with frank rowan, she can not make that appt. Will call to reschedule and call me back with the date and time to see if they can get in earlier if not she would like something for her pain til then

## 2011-08-29 NOTE — Telephone Encounter (Signed)
[name] from [facility] called back and had the orthopaedic appt rescheduled for November 7th,2012 at 8:30am. She does have 2 tramdol pills left and cal take extra strength tynelol. She will see if that will hold out til her appt. If not she will call back to see if she can get some ibuprofen med from doctor.

## 2011-09-11 ENCOUNTER — Other Ambulatory Visit: Payer: Medicare Other

## 2011-09-11 DIAGNOSIS — Z7901 Long term (current) use of anticoagulants: Secondary | ICD-10-CM

## 2011-09-11 LAB — PROTIME-INR: INR: 3.16 — ABNORMAL HIGH (ref <1.50)

## 2011-09-11 NOTE — Progress Notes (Signed)
Addended by: Janeice Robinson on: 09/11/2011 09:16 AM   Modules accepted: Orders

## 2011-09-16 ENCOUNTER — Other Ambulatory Visit: Payer: Self-pay | Admitting: Family Medicine

## 2011-09-17 ENCOUNTER — Telehealth: Payer: Self-pay | Admitting: Medical

## 2011-09-17 NOTE — Telephone Encounter (Signed)
Pt is scheduled for 11/26@ 2:30

## 2011-09-17 NOTE — Telephone Encounter (Signed)
Have her set up an appointment 

## 2011-09-17 NOTE — Telephone Encounter (Signed)
Is this okay to refill? 

## 2011-09-17 NOTE — Telephone Encounter (Signed)
[name] from [facility] were zeina akkerman lives called about [name] wanting to see if you could prescribe some kind of pain medicine for her knee or hip, you referred her a orthopedic where she is getting a series of 5 shots but doctor said only the shots only relief pain to 50% of the patients he gives it too and he would not prescribe something to her because all the meds she is on and referred her back to you. She always wants to know along with the pain medicine, can you prescribe her a topical pain cream to distract her from the pain.

## 2011-09-18 ENCOUNTER — Telehealth: Payer: Self-pay | Admitting: *Deleted

## 2011-09-20 ENCOUNTER — Other Ambulatory Visit: Payer: Self-pay | Admitting: Medical

## 2011-09-20 MED ORDER — IBANDRONATE SODIUM 150 MG PO TABS: 150.0000 mg | ORAL_TABLET | ORAL | Status: DC

## 2011-09-20 NOTE — Telephone Encounter (Signed)
FAX OVER THE PATIENTS RX

## 2011-09-20 NOTE — Telephone Encounter (Signed)
rx sent

## 2011-09-23 ENCOUNTER — Encounter: Payer: Self-pay | Admitting: Family Medicine

## 2011-09-23 ENCOUNTER — Ambulatory Visit (INDEPENDENT_AMBULATORY_CARE_PROVIDER_SITE_OTHER): Payer: Medicare Other | Admitting: Family Medicine

## 2011-09-23 VITALS — BP 118/76 | HR 60 | Wt 215.0 lb

## 2011-09-23 DIAGNOSIS — M25569 Pain in unspecified knee: Secondary | ICD-10-CM

## 2011-09-23 MED ORDER — TRAMADOL HCL 50 MG PO TABS: 50.0000 mg | ORAL_TABLET | Freq: Four times a day (QID) | ORAL | Status: DC | PRN

## 2011-09-23 NOTE — Progress Notes (Signed)
  Subjective:    Patient ID: [name], female    DOB: 18-Jul-1935, 76 y.o.   MRN: [id number]  HPI She is here for consultation. Recently she did have her Boniva renewed. She continues to have right knee pain and is in the process of getting injections. Apparently this is helping but she still requires pain medication. She does have an underlying history of CK D. stage III mitigating the use of NSAIDs.   Review of Systems     Objective:   Physical Exam Alert and in no distress complaining of some right knee pain.      Assessment & Plan:   1. Knee pain   2. CKD (chronic kidney disease), stage III    I will place her back on tramadol to help with the pain. Hopefully injections will help as well.

## 2011-09-24 ENCOUNTER — Ambulatory Visit: Payer: Medicare Other | Admitting: Family Medicine

## 2011-09-27 ENCOUNTER — Telehealth: Payer: Self-pay | Admitting: Family Medicine

## 2011-09-27 NOTE — Telephone Encounter (Signed)
DONE

## 2011-10-01 ENCOUNTER — Other Ambulatory Visit: Payer: Self-pay | Admitting: Family Medicine

## 2011-10-01 ENCOUNTER — Other Ambulatory Visit: Payer: Self-pay | Admitting: Medical

## 2011-10-01 ENCOUNTER — Telehealth: Payer: Self-pay | Admitting: Medical

## 2011-10-01 MED ORDER — ONDANSETRON 4 MG PO TBDP: 4.0000 mg | ORAL_TABLET | Freq: Three times a day (TID) | ORAL | Status: AC | PRN

## 2011-10-01 NOTE — Telephone Encounter (Signed)
PATIENT HAS CALLED AND SX. OF LIQUID DIARRHEA AND WANT S TO KNOW WHAT SHOULD SHE DO. CLS

## 2011-10-01 NOTE — Telephone Encounter (Signed)
See other msg

## 2011-10-07 ENCOUNTER — Telehealth: Payer: Self-pay | Admitting: Family Medicine

## 2011-10-07 NOTE — Telephone Encounter (Signed)
Message copied by Janeice Robinson on Mon Oct 07, 2011  9:33 AM ------      Message from: Jac Canavan      Created: Fri Oct 04, 2011  3:46 PM       i got a prior [place] request today on Zofran.  I called this out for nausea earlier in the week.  Pls ask if she was able to get any of this per pharmacy? If not is she still sick, did her symptoms resolve?              I have know way of knowing until days later if an insurer rejects a script, thus, if she was unable to get the Zofran due to a prior auth, i wished the group home would have called be back for an alternative..  In the future call back if there is ever a pharmacy issue such as this.

## 2011-10-07 NOTE — Telephone Encounter (Signed)
I CALLED AND SPOKE WITH THE PATIENTS CARE GIVER AND THEY SAID ESTER DID GET 2 DOSES OF ZOFRAN IN AND THAT'S ALL SHE NEEDED AND SHE IS MUCH BETTER AND SHE IS BACK AT WORK. CLS

## 2011-10-09 ENCOUNTER — Other Ambulatory Visit: Payer: Self-pay | Admitting: Family Medicine

## 2011-10-09 MED ORDER — WARFARIN SODIUM 5 MG PO TABS: 5.0000 mg | ORAL_TABLET | Freq: Every day | ORAL | Status: DC

## 2011-10-09 NOTE — Telephone Encounter (Signed)
RX REFILL ON WARFARIN.

## 2011-10-09 NOTE — Telephone Encounter (Signed)
Refill request on warfarin sodium 5mg 

## 2011-10-10 ENCOUNTER — Telehealth: Payer: Self-pay | Admitting: Family Medicine

## 2011-10-10 NOTE — Telephone Encounter (Signed)
Cordelia Pen clear 5 the dosing schedule and make sure that it states still at the pharmacy

## 2011-10-10 NOTE — Telephone Encounter (Signed)
Called pharmacy and fixed the problem

## 2011-10-14 ENCOUNTER — Other Ambulatory Visit: Payer: Self-pay | Admitting: Family Medicine

## 2011-10-23 ENCOUNTER — Other Ambulatory Visit: Payer: Self-pay | Admitting: Family Medicine

## 2011-10-24 ENCOUNTER — Other Ambulatory Visit: Payer: Medicare Other

## 2011-10-24 DIAGNOSIS — Z7901 Long term (current) use of anticoagulants: Secondary | ICD-10-CM

## 2011-10-24 DIAGNOSIS — Z79899 Other long term (current) drug therapy: Secondary | ICD-10-CM

## 2011-10-24 LAB — CBC WITH DIFFERENTIAL/PLATELET
Hemoglobin: 11 g/dL — ABNORMAL LOW (ref 12.0–15.0)
Lymphocytes Relative: 19 % (ref 12–46)
Lymphs Abs: 0.8 10*3/uL (ref 0.7–4.0)
Monocytes Relative: 10 % (ref 3–12)
Neutro Abs: 3 10*3/uL (ref 1.7–7.7)
Neutrophils Relative %: 68 % (ref 43–77)
RBC: 3.78 MIL/uL — ABNORMAL LOW (ref 3.87–5.11)

## 2011-10-24 LAB — COMPREHENSIVE METABOLIC PANEL
ALT: 10 U/L (ref 0–35)
CO2: 29 mEq/L (ref 19–32)
Calcium: 9.1 mg/dL (ref 8.4–10.5)
Chloride: 104 mEq/L (ref 96–112)
Sodium: 140 mEq/L (ref 135–145)
Total Bilirubin: 0.3 mg/dL (ref 0.3–1.2)
Total Protein: 6.5 g/dL (ref 6.0–8.3)

## 2011-10-24 LAB — PROTIME-INR
INR: 3.25 — ABNORMAL HIGH (ref <1.50)
Prothrombin Time: 33.4 seconds — ABNORMAL HIGH (ref 11.6–15.2)

## 2011-10-25 LAB — VALPROIC ACID LEVEL: Valproic Acid Lvl: 20.6 ug/mL — ABNORMAL LOW (ref 50.0–100.0)

## 2011-10-26 ENCOUNTER — Telehealth: Payer: Self-pay | Admitting: Oncology

## 2011-10-26 NOTE — Telephone Encounter (Signed)
Talked to staff member of pt, named Rinaldo Cloud, gave her appt for 2/14 lab and MD

## 2011-10-29 DIAGNOSIS — M8580 Other specified disorders of bone density and structure, unspecified site: Secondary | ICD-10-CM

## 2011-10-29 HISTORY — PX: KNEE ARTHROSCOPY: SHX127

## 2011-10-29 HISTORY — DX: Other specified disorders of bone density and structure, unspecified site: M85.80

## 2011-10-29 HISTORY — PX: EYE SURGERY: SHX253

## 2011-10-31 ENCOUNTER — Other Ambulatory Visit: Payer: Self-pay | Admitting: Family Medicine

## 2011-11-01 ENCOUNTER — Encounter: Payer: Self-pay | Admitting: Medical

## 2011-11-01 ENCOUNTER — Ambulatory Visit (INDEPENDENT_AMBULATORY_CARE_PROVIDER_SITE_OTHER): Payer: Medicare Other | Admitting: Medical

## 2011-11-01 VITALS — BP 150/60 | HR 68 | Temp 97.9°F | Resp 18 | Wt 213.0 lb

## 2011-11-01 DIAGNOSIS — S300XXA Contusion of lower back and pelvis, initial encounter: Secondary | ICD-10-CM | POA: Diagnosis not present

## 2011-11-01 DIAGNOSIS — R21 Rash and other nonspecific skin eruption: Secondary | ICD-10-CM | POA: Insufficient documentation

## 2011-11-01 NOTE — Progress Notes (Signed)
Subjective:  Here with group home representative for c/o rash on buttocks x 1 days.  Denies fever, doesn't feel ill.  She thinks she may have bumped up against a door at home.  She felt back on her buttock and it was sore, has seen some blood on her hand when she touched the area.  Denies prior similar.  No other c/o.   ROS: No fever, chills, sweats GI: no pain, nausea, vomiting, no diarrhea No recent falls. Skin: no redness, warmth, itching  Past Medical History  Diagnosis Date  . Anemia   . PVD (peripheral vascular disease)   . Hypertension   . Asthma   . Diabetes mellitus   . GERD (gastroesophageal reflux disease)   . Depression   . Mild mental retardation   . COPD (chronic obstructive pulmonary disease)   . Hx of deep venous thrombosis   . Mood disorder   . Constipation   . Chronic kidney disease   . Thrombocytopenia   . Cardiomyopathy   . Allergy     Objecitve: WD/WN, and, pleasant Skin: right buttock with 3cm x 4cm patch of mild erythema and some petechia suggestive of slight contusion, left buttock in roughly same area with similar sized area of ecchymosis MSK: right buttock mildly tender at site of rash, but no induration, fluctuance, erythema, or warmth   A/P:  Etiology not clear.  I suspect contusion vs other etiology and possible abrasion.  Group home will apply OTC antibiotic ointment to the area, keep the area clean, avoid injury, and if worse over the weekend, they will call my cell number for further advice.  Call report here Monday.

## 2011-11-01 NOTE — Patient Instructions (Signed)
Use some antibiotic ointment OTC on the right butt rash for the next few days.  Keep the area clean and dry.   Consider warm compresses to the area for 20 minutes twice daily.   If you begin to see blisters and redness, or if redness, worse pain, hot to touch area, then call me.  My cell number [phone].  If much worse or much different over the weekend, for example, fever, worse redness, worse pain, pus, blisters, etc., then call me.    Otherwise use the directions above, and consider rechecking Monday if not any better.

## 2011-11-04 DIAGNOSIS — L84 Corns and callosities: Secondary | ICD-10-CM | POA: Diagnosis not present

## 2011-11-04 DIAGNOSIS — M2040 Other hammer toe(s) (acquired), unspecified foot: Secondary | ICD-10-CM | POA: Diagnosis not present

## 2011-11-04 DIAGNOSIS — M79609 Pain in unspecified limb: Secondary | ICD-10-CM | POA: Diagnosis not present

## 2011-11-04 DIAGNOSIS — B351 Tinea unguium: Secondary | ICD-10-CM | POA: Diagnosis not present

## 2011-11-11 ENCOUNTER — Other Ambulatory Visit: Payer: Self-pay | Admitting: Family Medicine

## 2011-11-19 DIAGNOSIS — M2040 Other hammer toe(s) (acquired), unspecified foot: Secondary | ICD-10-CM | POA: Diagnosis not present

## 2011-11-19 DIAGNOSIS — I10 Essential (primary) hypertension: Secondary | ICD-10-CM | POA: Diagnosis not present

## 2011-11-25 ENCOUNTER — Telehealth: Payer: Self-pay | Admitting: Oncology

## 2011-11-25 DIAGNOSIS — M2040 Other hammer toe(s) (acquired), unspecified foot: Secondary | ICD-10-CM | POA: Diagnosis not present

## 2011-11-25 NOTE — Telephone Encounter (Signed)
l/m that 2/14 appt cx and that  i  r/s her to sara for 2/22 and to call if not avail   aom

## 2011-11-26 ENCOUNTER — Telehealth: Payer: Self-pay | Admitting: Oncology

## 2011-11-26 NOTE — Telephone Encounter (Signed)
deb from her grp home called([phone]) and needs to r/s 2.22 appt,r/s to 3/18 with tech due to needing pm appts.Marland Kitchenaom

## 2011-11-29 ENCOUNTER — Ambulatory Visit (INDEPENDENT_AMBULATORY_CARE_PROVIDER_SITE_OTHER): Payer: Medicare Other | Admitting: Medical

## 2011-11-29 ENCOUNTER — Telehealth: Payer: Self-pay | Admitting: Family Medicine

## 2011-11-29 ENCOUNTER — Encounter: Payer: Self-pay | Admitting: Medical

## 2011-11-29 VITALS — BP 130/70 | HR 60 | Temp 99.1°F | Resp 16 | Wt 210.0 lb

## 2011-11-29 DIAGNOSIS — Z7901 Long term (current) use of anticoagulants: Secondary | ICD-10-CM | POA: Diagnosis not present

## 2011-11-29 DIAGNOSIS — H6120 Impacted cerumen, unspecified ear: Secondary | ICD-10-CM

## 2011-11-29 LAB — PROTIME-INR: Prothrombin Time: 31.5 seconds — ABNORMAL HIGH (ref 11.6–15.2)

## 2011-11-29 NOTE — Patient Instructions (Signed)
We will call with lab results   

## 2011-11-29 NOTE — Progress Notes (Signed)
Subjective:   HPI Here for complaint of earwax buildup in both ears.   She is here with representative from the group home who says she has to have ears cleaned out about twice yearly.  She is also here for PT/INR.  She takes Coumadin 11mg  daily.  No other complaint.  Review of Systems Constitutional: denies fever, chills, sweats ENT: no runny nose, ear pain, sore throat, hoarseness, sinus pain, teeth pain, tinnitus, hearing loss Gastroenterology: denies nausea, vomiting     Objective:   Physical Exam  General appearance: alert, no distress, WD/WN HEENT: left ear canal with impacted cerumen; otherwise normocephalic,right ear canal with moderate cerumen, conjunctiva/corneas normal, sclerae anicteric, PERRLA, EOMi, nares patent, no discharge or erythema, pharynx normal Oral cavity: MMM, tongue normal, teeth normal Neurological: Hearing normal bilaterally to whisper    Assessment & Plan:    Encounter Diagnoses  Name Primary?  . Impacted cerumen Yes  . Encounter for long-term (current) use of anticoagulants      Discussed risk/benefits of procedure and patient agrees to procedure. Successfully used warm water lavage to remove impacted cerumen from bilateral ear canals.  Patient tolerated procedure well. Gave handout is below. Call or return if worse.  PT/INR today and we will call with results.

## 2011-12-02 ENCOUNTER — Other Ambulatory Visit: Payer: Self-pay | Admitting: Family Medicine

## 2011-12-02 NOTE — Telephone Encounter (Signed)
DONE

## 2011-12-07 ENCOUNTER — Other Ambulatory Visit: Payer: Self-pay | Admitting: Family Medicine

## 2011-12-12 ENCOUNTER — Ambulatory Visit: Payer: Medicare Other | Admitting: Oncology

## 2011-12-12 ENCOUNTER — Other Ambulatory Visit: Payer: Medicare Other | Admitting: Lab

## 2011-12-13 ENCOUNTER — Other Ambulatory Visit: Payer: Self-pay | Admitting: Family Medicine

## 2011-12-13 DIAGNOSIS — F329 Major depressive disorder, single episode, unspecified: Secondary | ICD-10-CM | POA: Diagnosis not present

## 2011-12-13 DIAGNOSIS — F29 Unspecified psychosis not due to a substance or known physiological condition: Secondary | ICD-10-CM | POA: Diagnosis not present

## 2011-12-19 ENCOUNTER — Other Ambulatory Visit: Payer: Self-pay | Admitting: Family Medicine

## 2011-12-19 NOTE — Telephone Encounter (Signed)
Is this okay?

## 2011-12-20 ENCOUNTER — Other Ambulatory Visit: Payer: Medicare Other | Admitting: Lab

## 2011-12-20 ENCOUNTER — Ambulatory Visit: Payer: Medicare Other | Admitting: Physician Assistant

## 2011-12-24 ENCOUNTER — Other Ambulatory Visit: Payer: Medicare Other

## 2011-12-24 DIAGNOSIS — Z7901 Long term (current) use of anticoagulants: Secondary | ICD-10-CM

## 2011-12-24 LAB — PROTIME-INR
INR: 3 — ABNORMAL HIGH (ref <1.50)
Prothrombin Time: 31.4 seconds — ABNORMAL HIGH (ref 11.6–15.2)

## 2012-01-06 ENCOUNTER — Other Ambulatory Visit: Payer: Self-pay | Admitting: Family Medicine

## 2012-01-08 ENCOUNTER — Other Ambulatory Visit: Payer: Self-pay | Admitting: Medical

## 2012-01-08 ENCOUNTER — Telehealth: Payer: Self-pay | Admitting: Family Medicine

## 2012-01-08 MED ORDER — ALBUTEROL SULFATE HFA 108 (90 BASE) MCG/ACT IN AERS: 2.0000 | INHALATION_SPRAY | Freq: Four times a day (QID) | RESPIRATORY_TRACT | Status: DC | PRN

## 2012-01-08 NOTE — Telephone Encounter (Signed)
I spoke with patient caregiver and they said that she is not sick , no fever, no chest pains and no swelling. They said around March she starts with wheezing and SOB but she is all right. CLS   Caregiver was notified that the inhaler was sent out. CLS

## 2012-01-08 NOTE — Telephone Encounter (Signed)
i sent the inhaler, but why do they think she is wheezing?   Do she seem sick, feverish? any chest pain or leg swelling? If any of those symptoms, may consider bringing her in.

## 2012-01-10 ENCOUNTER — Other Ambulatory Visit: Payer: Self-pay | Admitting: Family Medicine

## 2012-01-13 ENCOUNTER — Ambulatory Visit (HOSPITAL_BASED_OUTPATIENT_CLINIC_OR_DEPARTMENT_OTHER): Payer: Medicare Other | Admitting: Oncology

## 2012-01-13 ENCOUNTER — Other Ambulatory Visit: Payer: Self-pay | Admitting: Medical

## 2012-01-13 ENCOUNTER — Other Ambulatory Visit (HOSPITAL_BASED_OUTPATIENT_CLINIC_OR_DEPARTMENT_OTHER): Payer: Medicare Other | Admitting: Lab

## 2012-01-13 ENCOUNTER — Telehealth: Payer: Self-pay | Admitting: Oncology

## 2012-01-13 ENCOUNTER — Encounter: Payer: Self-pay | Admitting: Oncology

## 2012-01-13 VITALS — BP 131/61 | HR 64 | Temp 97.3°F | Ht 71.0 in | Wt 217.5 lb

## 2012-01-13 DIAGNOSIS — D649 Anemia, unspecified: Secondary | ICD-10-CM | POA: Diagnosis not present

## 2012-01-13 DIAGNOSIS — D696 Thrombocytopenia, unspecified: Secondary | ICD-10-CM | POA: Diagnosis not present

## 2012-01-13 DIAGNOSIS — M899 Disorder of bone, unspecified: Secondary | ICD-10-CM | POA: Diagnosis not present

## 2012-01-13 DIAGNOSIS — M949 Disorder of cartilage, unspecified: Secondary | ICD-10-CM

## 2012-01-13 DIAGNOSIS — N289 Disorder of kidney and ureter, unspecified: Secondary | ICD-10-CM

## 2012-01-13 LAB — LACTATE DEHYDROGENASE: LDH: 191 U/L (ref 94–250)

## 2012-01-13 LAB — CBC WITH DIFFERENTIAL/PLATELET
Basophils Absolute: 0 10*3/uL (ref 0.0–0.1)
EOS%: 2.8 % (ref 0.0–7.0)
Eosinophils Absolute: 0.1 10*3/uL (ref 0.0–0.5)
HGB: 10.9 g/dL — ABNORMAL LOW (ref 11.6–15.9)
NEUT#: 3.4 10*3/uL (ref 1.5–6.5)
RBC: 3.67 10*6/uL — ABNORMAL LOW (ref 3.70–5.45)
RDW: 13.4 % (ref 11.2–14.5)
lymph#: 0.9 10*3/uL (ref 0.9–3.3)

## 2012-01-13 LAB — COMPREHENSIVE METABOLIC PANEL
Albumin: 3.6 g/dL (ref 3.5–5.2)
BUN: 30 mg/dL — ABNORMAL HIGH (ref 6–23)
Calcium: 9.2 mg/dL (ref 8.4–10.5)
Chloride: 101 mEq/L (ref 96–112)
Glucose, Bld: 115 mg/dL — ABNORMAL HIGH (ref 70–99)
Potassium: 4.9 mEq/L (ref 3.5–5.3)

## 2012-01-13 NOTE — Progress Notes (Signed)
This office note has been dictated.  #[id number]

## 2012-01-13 NOTE — Telephone Encounter (Signed)
RX REFILL 

## 2012-01-13 NOTE — Progress Notes (Signed)
CC:   [name], M.D.  PROBLEM LIST: 1. Anemia, possibly due to renal insufficiency. 2. Thrombocytopenia, most likely due to immune dysregulation, i.e.,     chronic low-grade ITP. 3. Renal insufficiency. 4. Mental retardation. 5. Right lower extremity DVT in September 2010. 6. Anticoagulation therapy with Coumadin. 7. Hypertension. 8. Diabetes mellitus. 9. COPD. 10.GERD. 11.History of cervical cancer many years ago.  MEDICATIONS: 1. Wellbutrin SR 150 mg one daily. 2. Diovan 80 mg daily. 3. Depakote 125 mg twice daily. 4. Colace 100 mg twice daily. 5. Ferrous sulfate 325 mg twice daily. 6. Prozac 50 mg daily. 7. Flonase 1-2 sprays each nostril daily. 8. Boniva 100 mg every 30 days. 9. Imdur 1 tablet daily. 10.Claritin 10 mg daily. 11.Reglan 5 mg twice daily. 12.Lopressor 25 mg twice daily. 13.Singulair 10 mg at night. 14.Nitrostat 0.4 mg sublingually as needed. 15.Prilosec 20 mg daily. 16.Zocor 20 mg daily. 17.Ultram 50 mg every 6 hours as needed. 18.Coumadin 11 mg daily.  Protime/INR are checked every month.  HISTORY:  [name] is a 77 year old, white female who resides at [facility], a group home.  The patient is here today with a caregiver, [name].  The patient was last seen by [place] on 05/20/2011.  She is followed here for anemia and low-grade thrombocytopenia.  She was first seen by [place] on 04/12/2010.  We are following her without any specific therapy other than iron.  She has some degree of renal insufficiency, which may be the underlying cause for her anemia.  She may have immune dysregulation as the etiology of her thrombocytopenia.  The patient is really without any complaints today.  PHYSICAL EXAMINATION:  General Appearance:  The patient is very pleasant and clearly has some degree of mental retardation.  Weight is 217.5 pounds.  Height is 5 feet and 11 inches.  Body surface area is 2.22 m2. Vital Signs:  Blood pressure 131/61.  Other vital signs  are normal. HEENT:  There is no scleral icterus.  The patient apparently had cataract surgery on her right eye in the fall of 2012.  Mouth and pharynx are benign.  No peripheral adenopathy palpable.  Heart:  Normal. Lungs:  Normal.  Abdomen:  Obese and nontender with no organomegaly or masses palpable.  Extremities:  No peripheral edema or clubbing. Neurologic Exam:  Nonfocal.  LABORATORY DATA:  Today, white count 5, ANC 3.4, hemoglobin 10.9, hematocrit 32.7, platelets 115,000.  On 11/20/2010, hemoglobin was 12.3, hematocrit 36.1, and platelets were 125,000.  Chemistries today are notable for a BUN of 30, creatinine 1.42.  On 05/20/2011, the BUN was 33, creatinine 1.62.  Iron studies and sed rate are pending.  Iron studies from 05/20/2011 are notable for a ferritin of 191, iron saturation 50%.  Sed rate on 05/20/2011 was 27 as compared with 37 on 04/12/2010.  IMAGING STUDIES: 1. A bone density scan on 08/29/2010 gave a T-score of -1.3 of the     left femoral neck and a T-score of -0.8 of the left forearm.  The     patient has osteopenia involving the left femoral neck. 2. Screening bilateral mammogram on 06/24/2011 was negative.  IMPRESSION AND PLAN:  [name] has mild asymptomatic anemia and thrombocytopenia.  Iron studies and a sedimentation rate are currently pending.  The patient is on polypharmacy, which may be contributing to her thrombocytopenia.  We await the results of her iron studies.  We will plan to check a CBC and iron studies in 6 months and  reassess the patient again in 1 year at which time we will check CBC, chemistries, and iron studies.    ______________________________ Samul Dada, M.D. DSM/MEDQ  D:  01/13/2012  T:  01/13/2012  Job:  [id number]

## 2012-01-13 NOTE — Telephone Encounter (Signed)
gv pt appts for sept-march2014

## 2012-01-16 LAB — IRON AND TIBC
Iron: 69 ug/dL (ref 42–145)
UIBC: 173 ug/dL (ref 125–400)

## 2012-01-16 LAB — SEDIMENTATION RATE: Sed Rate: 27 mm/hr — ABNORMAL HIGH (ref 0–22)

## 2012-01-16 LAB — FERRITIN: Ferritin: 170 ng/mL (ref 10–291)

## 2012-01-16 LAB — TRANSFERRIN RECEPTOR, SOLUABLE: Transferrin Receptor, Soluble: 0.8 mg/L (ref 0.76–1.76)

## 2012-01-17 ENCOUNTER — Other Ambulatory Visit: Payer: Self-pay | Admitting: Family Medicine

## 2012-01-21 DIAGNOSIS — M9420 Chondromalacia, unspecified site: Secondary | ICD-10-CM | POA: Diagnosis not present

## 2012-01-21 DIAGNOSIS — M23302 Other meniscus derangements, unspecified lateral meniscus, unspecified knee: Secondary | ICD-10-CM | POA: Diagnosis not present

## 2012-01-28 ENCOUNTER — Ambulatory Visit (INDEPENDENT_AMBULATORY_CARE_PROVIDER_SITE_OTHER): Payer: Medicare Other | Admitting: Medical

## 2012-01-28 ENCOUNTER — Encounter: Payer: Self-pay | Admitting: Medical

## 2012-01-28 VITALS — BP 102/68 | HR 92 | Temp 97.5°F | Resp 18 | Wt 217.0 lb

## 2012-01-28 DIAGNOSIS — Z7901 Long term (current) use of anticoagulants: Secondary | ICD-10-CM | POA: Diagnosis not present

## 2012-01-28 DIAGNOSIS — R300 Dysuria: Secondary | ICD-10-CM | POA: Diagnosis not present

## 2012-01-28 LAB — POCT URINALYSIS DIPSTICK
Ketones, UA: NEGATIVE
Protein, UA: NEGATIVE
Spec Grav, UA: 1.015
Urobilinogen, UA: NEGATIVE
pH, UA: 5

## 2012-01-28 LAB — PROTIME-INR: Prothrombin Time: 27 seconds — ABNORMAL HIGH (ref 11.6–15.2)

## 2012-01-28 MED ORDER — NITROFURANTOIN MONOHYD MACRO 100 MG PO CAPS: 100.0000 mg | ORAL_CAPSULE | Freq: Two times a day (BID) | ORAL | Status: AC
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
## 2012-01-31 ENCOUNTER — Other Ambulatory Visit: Payer: Self-pay | Admitting: Family Medicine

## 2012-01-31 ENCOUNTER — Other Ambulatory Visit: Payer: Self-pay | Admitting: Medical

## 2012-01-31 ENCOUNTER — Telehealth: Payer: Self-pay | Admitting: Medical

## 2012-01-31 LAB — URINE CULTURE

## 2012-01-31 MED ORDER — CEFUROXIME AXETIL 500 MG PO TABS: 500.0000 mg | ORAL_TABLET | Freq: Two times a day (BID) | ORAL | Status: DC

## 2012-01-31 NOTE — Telephone Encounter (Signed)
Talked with deb they are waiting for her UTI to clear before rescheduling surgery Vincenza Hews told her to start coumadin back for now

## 2012-01-31 NOTE — Telephone Encounter (Signed)
Since they postponed, find out if they gave instructions on restarting Coumadin in the short term?  When is surgery rescheduled?    If no instructions were given, and if surgery postponed by more than a week, then restart her coumadin as she was doing, and have them call us with symptom early next week.

## 2012-02-07 ENCOUNTER — Other Ambulatory Visit (INDEPENDENT_AMBULATORY_CARE_PROVIDER_SITE_OTHER): Payer: Medicare Other

## 2012-02-07 DIAGNOSIS — R300 Dysuria: Secondary | ICD-10-CM | POA: Diagnosis not present

## 2012-02-07 DIAGNOSIS — Z7901 Long term (current) use of anticoagulants: Secondary | ICD-10-CM | POA: Diagnosis not present

## 2012-02-07 DIAGNOSIS — Z79899 Other long term (current) drug therapy: Secondary | ICD-10-CM

## 2012-02-07 LAB — POCT URINALYSIS DIPSTICK
Bilirubin, UA: NEGATIVE
Glucose, UA: NEGATIVE
Ketones, UA: NEGATIVE
Leukocytes, UA: NEGATIVE
Protein, UA: NEGATIVE
Spec Grav, UA: 1.01

## 2012-02-07 LAB — PROTIME-INR: INR: 1.25 (ref <1.50)

## 2012-02-14 DIAGNOSIS — S83289A Other tear of lateral meniscus, current injury, unspecified knee, initial encounter: Secondary | ICD-10-CM | POA: Diagnosis not present

## 2012-02-14 DIAGNOSIS — M23302 Other meniscus derangements, unspecified lateral meniscus, unspecified knee: Secondary | ICD-10-CM | POA: Diagnosis not present

## 2012-02-14 DIAGNOSIS — M2340 Loose body in knee, unspecified knee: Secondary | ICD-10-CM | POA: Diagnosis not present

## 2012-02-14 DIAGNOSIS — M9420 Chondromalacia, unspecified site: Secondary | ICD-10-CM | POA: Diagnosis not present

## 2012-02-14 DIAGNOSIS — M2240 Chondromalacia patellae, unspecified knee: Secondary | ICD-10-CM | POA: Diagnosis not present

## 2012-03-06 DIAGNOSIS — F329 Major depressive disorder, single episode, unspecified: Secondary | ICD-10-CM | POA: Diagnosis not present

## 2012-03-06 DIAGNOSIS — F29 Unspecified psychosis not due to a substance or known physiological condition: Secondary | ICD-10-CM | POA: Diagnosis not present

## 2012-03-09 ENCOUNTER — Ambulatory Visit: Payer: Medicare Other | Attending: Orthopedic Surgery

## 2012-03-09 DIAGNOSIS — R262 Difficulty in walking, not elsewhere classified: Secondary | ICD-10-CM | POA: Insufficient documentation

## 2012-03-09 DIAGNOSIS — M25669 Stiffness of unspecified knee, not elsewhere classified: Secondary | ICD-10-CM | POA: Insufficient documentation

## 2012-03-09 DIAGNOSIS — IMO0001 Reserved for inherently not codable concepts without codable children: Secondary | ICD-10-CM | POA: Diagnosis not present

## 2012-03-09 DIAGNOSIS — M25569 Pain in unspecified knee: Secondary | ICD-10-CM | POA: Insufficient documentation

## 2012-03-12 ENCOUNTER — Encounter: Payer: Medicare Other | Admitting: Physical Therapy

## 2012-03-12 DIAGNOSIS — M79609 Pain in unspecified limb: Secondary | ICD-10-CM | POA: Diagnosis not present

## 2012-03-12 DIAGNOSIS — B351 Tinea unguium: Secondary | ICD-10-CM | POA: Diagnosis not present

## 2012-03-16 ENCOUNTER — Ambulatory Visit: Payer: Medicare Other

## 2012-03-16 DIAGNOSIS — M25569 Pain in unspecified knee: Secondary | ICD-10-CM | POA: Diagnosis not present

## 2012-03-16 DIAGNOSIS — M25669 Stiffness of unspecified knee, not elsewhere classified: Secondary | ICD-10-CM | POA: Diagnosis not present

## 2012-03-16 DIAGNOSIS — IMO0001 Reserved for inherently not codable concepts without codable children: Secondary | ICD-10-CM | POA: Diagnosis not present

## 2012-03-16 DIAGNOSIS — R262 Difficulty in walking, not elsewhere classified: Secondary | ICD-10-CM | POA: Diagnosis not present

## 2012-03-18 ENCOUNTER — Ambulatory Visit: Payer: Medicare Other

## 2012-03-18 DIAGNOSIS — R262 Difficulty in walking, not elsewhere classified: Secondary | ICD-10-CM | POA: Diagnosis not present

## 2012-03-18 DIAGNOSIS — M25669 Stiffness of unspecified knee, not elsewhere classified: Secondary | ICD-10-CM | POA: Diagnosis not present

## 2012-03-18 DIAGNOSIS — IMO0001 Reserved for inherently not codable concepts without codable children: Secondary | ICD-10-CM | POA: Diagnosis not present

## 2012-03-18 DIAGNOSIS — M25569 Pain in unspecified knee: Secondary | ICD-10-CM | POA: Diagnosis not present

## 2012-03-24 ENCOUNTER — Ambulatory Visit: Payer: Medicare Other

## 2012-03-24 ENCOUNTER — Telehealth: Payer: Self-pay | Admitting: Family Medicine

## 2012-03-24 DIAGNOSIS — R262 Difficulty in walking, not elsewhere classified: Secondary | ICD-10-CM | POA: Diagnosis not present

## 2012-03-24 DIAGNOSIS — M25569 Pain in unspecified knee: Secondary | ICD-10-CM | POA: Diagnosis not present

## 2012-03-24 DIAGNOSIS — M25669 Stiffness of unspecified knee, not elsewhere classified: Secondary | ICD-10-CM | POA: Diagnosis not present

## 2012-03-24 DIAGNOSIS — IMO0001 Reserved for inherently not codable concepts without codable children: Secondary | ICD-10-CM | POA: Diagnosis not present

## 2012-03-24 NOTE — Telephone Encounter (Signed)
Pt has appt

## 2012-03-24 NOTE — Telephone Encounter (Signed)
Schedule her for a regular appointment

## 2012-03-26 ENCOUNTER — Ambulatory Visit: Payer: Medicare Other | Admitting: Physical Therapy

## 2012-03-26 ENCOUNTER — Ambulatory Visit (INDEPENDENT_AMBULATORY_CARE_PROVIDER_SITE_OTHER): Payer: Medicare Other | Admitting: Family Medicine

## 2012-03-26 ENCOUNTER — Ambulatory Visit
Admission: RE | Admit: 2012-03-26 | Discharge: 2012-03-26 | Disposition: A | Discharge status: Home / Self Care | Payer: Medicare Other | Source: Ambulatory Visit | Attending: Family Medicine | Admitting: Family Medicine

## 2012-03-26 ENCOUNTER — Encounter: Payer: Self-pay | Admitting: Family Medicine

## 2012-03-26 ENCOUNTER — Other Ambulatory Visit: Payer: Self-pay | Admitting: Family Medicine

## 2012-03-26 VITALS — BP 124/80 | HR 85 | Wt 210.0 lb

## 2012-03-26 DIAGNOSIS — J984 Other disorders of lung: Secondary | ICD-10-CM | POA: Diagnosis not present

## 2012-03-26 DIAGNOSIS — IMO0001 Reserved for inherently not codable concepts without codable children: Secondary | ICD-10-CM | POA: Diagnosis not present

## 2012-03-26 DIAGNOSIS — F39 Unspecified mood [affective] disorder: Secondary | ICD-10-CM | POA: Diagnosis not present

## 2012-03-26 DIAGNOSIS — Z7901 Long term (current) use of anticoagulants: Secondary | ICD-10-CM

## 2012-03-26 DIAGNOSIS — I428 Other cardiomyopathies: Secondary | ICD-10-CM | POA: Diagnosis not present

## 2012-03-26 DIAGNOSIS — R5383 Other fatigue: Secondary | ICD-10-CM | POA: Diagnosis not present

## 2012-03-26 DIAGNOSIS — R918 Other nonspecific abnormal finding of lung field: Secondary | ICD-10-CM | POA: Diagnosis not present

## 2012-03-26 DIAGNOSIS — M25569 Pain in unspecified knee: Secondary | ICD-10-CM | POA: Diagnosis not present

## 2012-03-26 DIAGNOSIS — M25669 Stiffness of unspecified knee, not elsewhere classified: Secondary | ICD-10-CM | POA: Diagnosis not present

## 2012-03-26 DIAGNOSIS — R5381 Other malaise: Secondary | ICD-10-CM | POA: Diagnosis not present

## 2012-03-26 DIAGNOSIS — R262 Difficulty in walking, not elsewhere classified: Secondary | ICD-10-CM | POA: Diagnosis not present

## 2012-03-26 LAB — CBC WITH DIFFERENTIAL/PLATELET
Basophils Absolute: 0 10*3/uL (ref 0.0–0.1)
Eosinophils Relative: 2 % (ref 0–5)
HCT: 35.3 % — ABNORMAL LOW (ref 36.0–46.0)
Lymphocytes Relative: 26 % (ref 12–46)
MCH: 28.4 pg (ref 26.0–34.0)
MCHC: 32.6 g/dL (ref 30.0–36.0)
MCV: 87.2 fL (ref 78.0–100.0)
Monocytes Absolute: 0.5 10*3/uL (ref 0.1–1.0)
RDW: 13.1 % (ref 11.5–15.5)

## 2012-03-26 LAB — PROTIME-INR: Prothrombin Time: 32 seconds — ABNORMAL HIGH (ref 11.6–15.2)

## 2012-03-26 LAB — TSH: TSH: 0.758 u[IU]/mL (ref 0.350–4.500)

## 2012-03-26 LAB — COMPREHENSIVE METABOLIC PANEL
ALT: 11 U/L (ref 0–35)
AST: 17 U/L (ref 0–37)
Alkaline Phosphatase: 68 U/L (ref 39–117)
Creat: 1.44 mg/dL — ABNORMAL HIGH (ref 0.50–1.10)
Total Bilirubin: 0.3 mg/dL (ref 0.3–1.2)

## 2012-03-26 LAB — VALPROIC ACID LEVEL: Valproic Acid Lvl: 30.8 ug/mL — ABNORMAL LOW (ref 50.0–100.0)

## 2012-03-26 NOTE — Progress Notes (Signed)
  Subjective:    Patient ID: [name], female    DOB: 21-Jun-1935, 77 y.o.   MRN: [id number]  HPI She complains of a three-week history of fatigue but no fever, chills, chest pain, shortness of breath, urinary symptoms abdominal pain, nausea or vomiting. Her blood sugars have been running in the low 100s. Her Depakote was increased in March. All the other medications are unchanged.   Review of Systems     Objective:   Physical Exam alert and in no distress. Tympanic membranes and canals are normal. Throat is clear. Tonsils are normal. Neck is supple without adenopathy or thyromegaly. Cardiac exam shows a regular sinus rhythm without murmurs or gallops. Lungs are clear to auscultation. DTRs are normal EKG shows no acute changes       Assessment & Plan:   1. Fatigue  PR ELECTROCARDIOGRAM, COMPLETE, DG Chest 2 View, Valproic acid level, TSH  2. Encounter for long-term (current) use of anticoagulants  INR/PT  3. Mood disorder  Valproic acid level   Difficult to figure out exactly what causing her fatigue. Routine blood screening was also ordered

## 2012-03-26 NOTE — Progress Notes (Signed)
Addended by: Lavell Islam on: 03/26/2012 03:50 PM   Modules accepted: Orders

## 2012-04-01 ENCOUNTER — Encounter: Payer: Medicare Other | Admitting: Physical Therapy

## 2012-04-06 ENCOUNTER — Encounter: Payer: Medicare Other | Admitting: Physical Therapy

## 2012-04-08 ENCOUNTER — Encounter: Payer: Medicare Other | Admitting: Physical Therapy

## 2012-04-10 ENCOUNTER — Other Ambulatory Visit: Payer: Self-pay | Admitting: Family Medicine

## 2012-04-16 ENCOUNTER — Other Ambulatory Visit: Payer: Self-pay | Admitting: Family Medicine

## 2012-04-17 NOTE — Telephone Encounter (Signed)
Due next week for PT/INR

## 2012-04-17 NOTE — Telephone Encounter (Signed)
THE CAREGIVERS FOR THE PATIENT WAS NOTIFIED THAT THE PATIENT NEEDS TO COME IN FOR PT/INR CHECK NEXT WEEK AND SHE IS ON THE SCHEDULE FOR NEXT WEEK. CLS

## 2012-04-17 NOTE — Telephone Encounter (Signed)
RX REFILL 

## 2012-04-22 ENCOUNTER — Telehealth: Payer: Self-pay | Admitting: Family Medicine

## 2012-04-22 ENCOUNTER — Other Ambulatory Visit (INDEPENDENT_AMBULATORY_CARE_PROVIDER_SITE_OTHER): Payer: Medicare Other

## 2012-04-22 DIAGNOSIS — Z7901 Long term (current) use of anticoagulants: Secondary | ICD-10-CM | POA: Diagnosis not present

## 2012-04-22 DIAGNOSIS — R300 Dysuria: Secondary | ICD-10-CM

## 2012-04-22 LAB — PROTIME-INR
INR: 2.48 — ABNORMAL HIGH (ref <1.50)
Prothrombin Time: 27 seconds — ABNORMAL HIGH (ref 11.6–15.2)

## 2012-04-22 LAB — POCT URINALYSIS DIPSTICK
Bilirubin, UA: NEGATIVE
Blood, UA: NEGATIVE
Glucose, UA: NEGATIVE
Ketones, UA: NEGATIVE
Spec Grav, UA: 1.015

## 2012-04-22 NOTE — Telephone Encounter (Signed)
THE LAB CALLED AND THE PATIENTS INR IS 2.48. CLS

## 2012-04-24 NOTE — Telephone Encounter (Signed)
done

## 2012-04-27 ENCOUNTER — Other Ambulatory Visit: Payer: Self-pay | Admitting: Family Medicine

## 2012-05-07 ENCOUNTER — Telehealth: Payer: Self-pay

## 2012-05-07 NOTE — Telephone Encounter (Signed)
[name] from [facility] call ed and asked if we would D/C the nitro because she has not used or needed it since she has been there 4 years was RX to her when she lived in nursing home

## 2012-05-07 NOTE — Telephone Encounter (Signed)
Go ahead and DC this medication

## 2012-05-07 NOTE — Telephone Encounter (Signed)
Faxed D/C order to [phone]

## 2012-05-11 ENCOUNTER — Other Ambulatory Visit: Payer: Self-pay | Admitting: Family Medicine

## 2012-05-11 ENCOUNTER — Telehealth: Payer: Self-pay

## 2012-05-11 NOTE — Telephone Encounter (Signed)
[name] called said pt tremors are getting worse gave her an appt for Thursday July 18

## 2012-05-14 ENCOUNTER — Encounter: Payer: Self-pay | Admitting: Family Medicine

## 2012-05-14 ENCOUNTER — Ambulatory Visit (INDEPENDENT_AMBULATORY_CARE_PROVIDER_SITE_OTHER): Payer: Medicare Other | Admitting: Family Medicine

## 2012-05-14 VITALS — BP 116/80 | HR 60 | Wt 212.0 lb

## 2012-05-14 DIAGNOSIS — G250 Essential tremor: Secondary | ICD-10-CM | POA: Diagnosis not present

## 2012-05-14 DIAGNOSIS — G252 Other specified forms of tremor: Secondary | ICD-10-CM | POA: Diagnosis not present

## 2012-05-14 DIAGNOSIS — Z7901 Long term (current) use of anticoagulants: Secondary | ICD-10-CM

## 2012-05-14 DIAGNOSIS — R251 Tremor, unspecified: Secondary | ICD-10-CM

## 2012-05-14 LAB — THYROID PANEL WITH TSH
Free Thyroxine Index: 2.4 (ref 1.0–3.9)
T3 Uptake: 37.6 % — ABNORMAL HIGH (ref 22.5–37.0)
T4, Total: 6.4 ug/dL (ref 5.0–12.5)
TSH: 0.913 u[IU]/mL (ref 0.350–4.500)

## 2012-05-14 NOTE — Progress Notes (Signed)
  Subjective:    Patient ID: [name], female    DOB: 1935-01-19, 77 y.o.   MRN: [id number]  HPI She has noted over the last several weeks increased difficulty with hand tremor. In the past one hand has tended to shave will do more than the other. She now however is having difficulty holding onto things and occasionally has dropped items. Her medications were reviewed and are unchanged  Review of Systems     Objective:   Physical Exam Alert and in no distress. Slight tremor of the hands is noted. With motion the tremor does tend to go away. Cardiac exam shows regular rhythm without murmurs or gallops. Reflexes are brisk.       Assessment & Plan:   1. Tremor of hands and face  Thyroid Panel With TSH   If thyroid panel is normal, neurology referral will be made.

## 2012-05-14 NOTE — Addendum Note (Signed)
Addended by: Lavell Islam on: 05/14/2012 02:09 PM   Modules accepted: Orders

## 2012-05-25 DIAGNOSIS — E119 Type 2 diabetes mellitus without complications: Secondary | ICD-10-CM | POA: Diagnosis not present

## 2012-05-25 DIAGNOSIS — H2510 Age-related nuclear cataract, unspecified eye: Secondary | ICD-10-CM | POA: Diagnosis not present

## 2012-05-25 DIAGNOSIS — H25019 Cortical age-related cataract, unspecified eye: Secondary | ICD-10-CM | POA: Diagnosis not present

## 2012-05-25 DIAGNOSIS — H04129 Dry eye syndrome of unspecified lacrimal gland: Secondary | ICD-10-CM | POA: Diagnosis not present

## 2012-05-29 DIAGNOSIS — F29 Unspecified psychosis not due to a substance or known physiological condition: Secondary | ICD-10-CM | POA: Diagnosis not present

## 2012-05-29 DIAGNOSIS — F329 Major depressive disorder, single episode, unspecified: Secondary | ICD-10-CM | POA: Diagnosis not present

## 2012-06-03 DIAGNOSIS — R259 Unspecified abnormal involuntary movements: Secondary | ICD-10-CM | POA: Diagnosis not present

## 2012-06-09 ENCOUNTER — Other Ambulatory Visit: Payer: Medicare Other

## 2012-06-09 DIAGNOSIS — Z7901 Long term (current) use of anticoagulants: Secondary | ICD-10-CM | POA: Diagnosis not present

## 2012-06-16 ENCOUNTER — Telehealth: Payer: Self-pay | Admitting: Family Medicine

## 2012-06-16 NOTE — Telephone Encounter (Signed)
FAXED AMENDED ORDER TO STOP WARFARIN 2 DAYS PRIOR ONLY CONTINUE AS BEFORE SURGERY

## 2012-06-16 NOTE — Telephone Encounter (Signed)
Check the paperwork on what the protocol is for cataracts and reinforce this

## 2012-06-16 NOTE — Telephone Encounter (Signed)
[name] with [facility] called. She stated that her instructions for pt are to stop warfarin 2 days before procedure and 2 days after procedure. That would be a total of 5 days. She states the last time she had the cataract surgery is was 2 days after only. She just wanted to double check to make sure she was correct with everything now. Please call pt.

## 2012-06-17 DIAGNOSIS — H25019 Cortical age-related cataract, unspecified eye: Secondary | ICD-10-CM | POA: Diagnosis not present

## 2012-06-17 DIAGNOSIS — H2510 Age-related nuclear cataract, unspecified eye: Secondary | ICD-10-CM | POA: Diagnosis not present

## 2012-06-17 DIAGNOSIS — E1139 Type 2 diabetes mellitus with other diabetic ophthalmic complication: Secondary | ICD-10-CM | POA: Diagnosis not present

## 2012-06-17 DIAGNOSIS — H2589 Other age-related cataract: Secondary | ICD-10-CM | POA: Diagnosis not present

## 2012-06-17 DIAGNOSIS — E1136 Type 2 diabetes mellitus with diabetic cataract: Secondary | ICD-10-CM | POA: Diagnosis not present

## 2012-06-17 DIAGNOSIS — IMO0002 Reserved for concepts with insufficient information to code with codable children: Secondary | ICD-10-CM | POA: Diagnosis not present

## 2012-06-19 ENCOUNTER — Other Ambulatory Visit: Payer: Self-pay | Admitting: Family Medicine

## 2012-06-19 DIAGNOSIS — Z1231 Encounter for screening mammogram for malignant neoplasm of breast: Secondary | ICD-10-CM

## 2012-06-22 DIAGNOSIS — E1149 Type 2 diabetes mellitus with other diabetic neurological complication: Secondary | ICD-10-CM | POA: Diagnosis not present

## 2012-06-22 DIAGNOSIS — B351 Tinea unguium: Secondary | ICD-10-CM | POA: Diagnosis not present

## 2012-06-22 DIAGNOSIS — L84 Corns and callosities: Secondary | ICD-10-CM | POA: Diagnosis not present

## 2012-06-23 ENCOUNTER — Other Ambulatory Visit: Payer: Self-pay

## 2012-06-23 MED ORDER — OMEPRAZOLE 20 MG PO CPDR: 20.0000 mg | DELAYED_RELEASE_CAPSULE | Freq: Every day | ORAL | Status: DC

## 2012-06-23 NOTE — Telephone Encounter (Signed)
Fax came in for refill for omeprazole i refilled

## 2012-06-25 DIAGNOSIS — M1710 Unilateral primary osteoarthritis, unspecified knee: Secondary | ICD-10-CM | POA: Diagnosis not present

## 2012-07-07 ENCOUNTER — Ambulatory Visit: Payer: Medicare Other

## 2012-07-10 ENCOUNTER — Other Ambulatory Visit: Payer: Medicare Other

## 2012-07-10 DIAGNOSIS — Z7901 Long term (current) use of anticoagulants: Secondary | ICD-10-CM | POA: Diagnosis not present

## 2012-07-10 LAB — PROTIME-INR: INR: 2.57 — ABNORMAL HIGH (ref <1.50)

## 2012-07-13 ENCOUNTER — Other Ambulatory Visit: Payer: Self-pay | Admitting: Family Medicine

## 2012-07-13 ENCOUNTER — Other Ambulatory Visit: Payer: Self-pay | Admitting: Medical

## 2012-07-13 DIAGNOSIS — H04129 Dry eye syndrome of unspecified lacrimal gland: Secondary | ICD-10-CM | POA: Diagnosis not present

## 2012-07-15 ENCOUNTER — Other Ambulatory Visit: Payer: Self-pay

## 2012-07-15 ENCOUNTER — Other Ambulatory Visit (HOSPITAL_BASED_OUTPATIENT_CLINIC_OR_DEPARTMENT_OTHER): Payer: Medicare Other | Admitting: Lab

## 2012-07-15 DIAGNOSIS — D649 Anemia, unspecified: Secondary | ICD-10-CM | POA: Diagnosis not present

## 2012-07-15 LAB — CBC WITH DIFFERENTIAL/PLATELET
BASO%: 0.3 % (ref 0.0–2.0)
Eosinophils Absolute: 0.1 10*3/uL (ref 0.0–0.5)
HCT: 33.3 % — ABNORMAL LOW (ref 34.8–46.6)
LYMPH%: 19.4 % (ref 14.0–49.7)
MCHC: 34.6 g/dL (ref 31.5–36.0)
MONO#: 0.8 10*3/uL (ref 0.1–0.9)
NEUT#: 3.4 10*3/uL (ref 1.5–6.5)
NEUT%: 63.4 % (ref 38.4–76.8)
Platelets: 113 10*3/uL — ABNORMAL LOW (ref 145–400)
RBC: 3.8 10*6/uL (ref 3.70–5.45)
WBC: 5.4 10*3/uL (ref 3.9–10.3)
lymph#: 1.1 10*3/uL (ref 0.9–3.3)

## 2012-07-15 LAB — FERRITIN: Ferritin: 284 ng/mL (ref 10–291)

## 2012-07-15 LAB — IRON AND TIBC
%SAT: 34 % (ref 20–55)
Iron: 97 ug/dL (ref 42–145)
TIBC: 287 ug/dL (ref 250–470)

## 2012-07-16 ENCOUNTER — Ambulatory Visit
Admission: RE | Admit: 2012-07-16 | Discharge: 2012-07-16 | Disposition: A | Discharge status: Home / Self Care | Payer: Medicare Other | Source: Ambulatory Visit | Attending: Family Medicine | Admitting: Family Medicine

## 2012-07-16 DIAGNOSIS — Z1231 Encounter for screening mammogram for malignant neoplasm of breast: Secondary | ICD-10-CM | POA: Diagnosis not present

## 2012-07-17 ENCOUNTER — Telehealth: Payer: Self-pay | Admitting: Oncology

## 2012-07-17 NOTE — Telephone Encounter (Signed)
S/w lindsey from the [facility] regarding this pt having a lab appt prior to her md appt in march of 2014@2 :30pm

## 2012-07-21 ENCOUNTER — Other Ambulatory Visit: Payer: Self-pay | Admitting: Medical

## 2012-07-24 ENCOUNTER — Other Ambulatory Visit: Payer: Self-pay | Admitting: Family Medicine

## 2012-07-24 DIAGNOSIS — R259 Unspecified abnormal involuntary movements: Secondary | ICD-10-CM | POA: Diagnosis not present

## 2012-07-29 ENCOUNTER — Other Ambulatory Visit: Payer: Self-pay | Admitting: Family Medicine

## 2012-08-05 ENCOUNTER — Other Ambulatory Visit: Payer: Self-pay | Admitting: Family Medicine

## 2012-08-07 DIAGNOSIS — F29 Unspecified psychosis not due to a substance or known physiological condition: Secondary | ICD-10-CM | POA: Diagnosis not present

## 2012-08-07 DIAGNOSIS — F329 Major depressive disorder, single episode, unspecified: Secondary | ICD-10-CM | POA: Diagnosis not present

## 2012-08-10 ENCOUNTER — Ambulatory Visit (INDEPENDENT_AMBULATORY_CARE_PROVIDER_SITE_OTHER): Payer: Medicare Other | Admitting: Family Medicine

## 2012-08-10 ENCOUNTER — Encounter: Payer: Self-pay | Admitting: Family Medicine

## 2012-08-10 VITALS — BP 114/60 | HR 60 | Wt 210.0 lb

## 2012-08-10 DIAGNOSIS — Z23 Encounter for immunization: Secondary | ICD-10-CM | POA: Diagnosis not present

## 2012-08-10 DIAGNOSIS — K219 Gastro-esophageal reflux disease without esophagitis: Secondary | ICD-10-CM | POA: Diagnosis not present

## 2012-08-10 DIAGNOSIS — F32A Depression, unspecified: Secondary | ICD-10-CM | POA: Insufficient documentation

## 2012-08-10 DIAGNOSIS — F329 Major depressive disorder, single episode, unspecified: Secondary | ICD-10-CM

## 2012-08-10 DIAGNOSIS — I10 Essential (primary) hypertension: Secondary | ICD-10-CM

## 2012-08-10 DIAGNOSIS — F70 Mild intellectual disabilities: Secondary | ICD-10-CM | POA: Diagnosis not present

## 2012-08-10 DIAGNOSIS — Z7901 Long term (current) use of anticoagulants: Secondary | ICD-10-CM | POA: Diagnosis not present

## 2012-08-10 MED ORDER — INFLUENZA VIRUS VACC SPLIT PF IM SUSP: 0.5000 mL | Freq: Once | INTRAMUSCULAR | Status: DC

## 2012-08-10 NOTE — Progress Notes (Signed)
  Subjective:    Patient ID: [name], female    DOB: 1935/03/03, 77 y.o.   MRN: [id number]  HPI She is here for a medication management consult. She is here with [name] who is the director of her group home. She recently saw her neurologist who added clonazepam to her regimen. Her Depakote dosing was reduced however adding the clonazepam has helped her mood disorder making her more manageable she is also taking Reglan however there is no good data as to why. She has been on this for several years. She also is taking Prilosec   Review of Systems     Objective:   Physical Exam Alert and in no distress otherwise not examined       Assessment & Plan:   1. Long term (current) use of anticoagulants  Protime-INR  2. GERD (gastroesophageal reflux disease)    3. Essential hypertension, benign    4. Mild mental retardation    5. Depression    I will have them hold the Reglan and see how she does as well as the Prilosec. They're to keep track of any GI symptoms. Flu shot will be given. I also wrote a prescription to get Zostavax.

## 2012-08-12 ENCOUNTER — Encounter: Payer: Self-pay | Admitting: Internal Medicine

## 2012-08-24 ENCOUNTER — Encounter: Payer: Self-pay | Admitting: Medical

## 2012-08-24 ENCOUNTER — Ambulatory Visit (INDEPENDENT_AMBULATORY_CARE_PROVIDER_SITE_OTHER): Payer: Medicare Other | Admitting: Medical

## 2012-08-24 VITALS — BP 132/70 | HR 60 | Temp 97.5°F | Resp 16 | Ht 68.0 in | Wt 208.0 lb

## 2012-08-24 DIAGNOSIS — M949 Disorder of cartilage, unspecified: Secondary | ICD-10-CM | POA: Diagnosis not present

## 2012-08-24 DIAGNOSIS — M899 Disorder of bone, unspecified: Secondary | ICD-10-CM | POA: Diagnosis not present

## 2012-08-24 DIAGNOSIS — D649 Anemia, unspecified: Secondary | ICD-10-CM

## 2012-08-24 DIAGNOSIS — D696 Thrombocytopenia, unspecified: Secondary | ICD-10-CM

## 2012-08-24 DIAGNOSIS — Z7901 Long term (current) use of anticoagulants: Secondary | ICD-10-CM | POA: Diagnosis not present

## 2012-08-24 DIAGNOSIS — K219 Gastro-esophageal reflux disease without esophagitis: Secondary | ICD-10-CM

## 2012-08-24 DIAGNOSIS — H6122 Impacted cerumen, left ear: Secondary | ICD-10-CM

## 2012-08-24 DIAGNOSIS — R251 Tremor, unspecified: Secondary | ICD-10-CM

## 2012-08-24 DIAGNOSIS — H6120 Impacted cerumen, unspecified ear: Secondary | ICD-10-CM | POA: Diagnosis not present

## 2012-08-24 DIAGNOSIS — E785 Hyperlipidemia, unspecified: Secondary | ICD-10-CM | POA: Diagnosis not present

## 2012-08-24 DIAGNOSIS — E119 Type 2 diabetes mellitus without complications: Secondary | ICD-10-CM

## 2012-08-24 DIAGNOSIS — I10 Essential (primary) hypertension: Secondary | ICD-10-CM

## 2012-08-24 DIAGNOSIS — M8580 Other specified disorders of bone density and structure, unspecified site: Secondary | ICD-10-CM

## 2012-08-24 DIAGNOSIS — R259 Unspecified abnormal involuntary movements: Secondary | ICD-10-CM

## 2012-08-24 LAB — COMPREHENSIVE METABOLIC PANEL
Albumin: 4.4 g/dL (ref 3.5–5.2)
BUN: 27 mg/dL — ABNORMAL HIGH (ref 6–23)
CO2: 33 mEq/L — ABNORMAL HIGH (ref 19–32)
Calcium: 10 mg/dL (ref 8.4–10.5)
Glucose, Bld: 89 mg/dL (ref 70–99)
Potassium: 4.8 mEq/L (ref 3.5–5.3)
Sodium: 138 mEq/L (ref 135–145)
Total Protein: 6.7 g/dL (ref 6.0–8.3)

## 2012-08-24 LAB — LIPID PANEL: Cholesterol: 144 mg/dL (ref 0–200)
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
## 2012-08-25 ENCOUNTER — Encounter: Payer: Self-pay | Admitting: Medical

## 2012-08-25 ENCOUNTER — Telehealth: Payer: Self-pay | Admitting: Family Medicine

## 2012-08-25 LAB — HEMOGLOBIN A1C
Hgb A1c MFr Bld: 6.1 % — ABNORMAL HIGH (ref <5.7)
Mean Plasma Glucose: 128 mg/dL — ABNORMAL HIGH (ref <117)

## 2012-08-25 LAB — VITAMIN D 25 HYDROXY (VIT D DEFICIENCY, FRACTURES): Vit D, 25-Hydroxy: 60 ng/mL (ref 30–89)

## 2012-08-25 NOTE — Telephone Encounter (Signed)
PATIENT IS AWARE OF HER BONE DENSITY APPOINTMENT ON 09/03/12 @ 130 PM AT [facility]. CLS

## 2012-08-25 NOTE — Telephone Encounter (Signed)
Message copied by Janeice Robinson on Tue Aug 25, 2012 12:49 PM ------      Message from: Jac Canavan      Created: Tue Aug 25, 2012  2:24 AM       Set up bone density scan.  Send last bone density report along with referral.

## 2012-08-25 NOTE — Patient Instructions (Signed)
We will call with lab results   

## 2012-09-03 ENCOUNTER — Other Ambulatory Visit: Payer: Self-pay | Admitting: Family Medicine

## 2012-09-03 ENCOUNTER — Ambulatory Visit
Admission: RE | Admit: 2012-09-03 | Discharge: 2012-09-03 | Disposition: A | Discharge status: Home / Self Care | Payer: Medicare Other | Source: Ambulatory Visit | Attending: Medical | Admitting: Medical

## 2012-09-03 ENCOUNTER — Encounter: Payer: Self-pay | Admitting: Family Medicine

## 2012-09-03 DIAGNOSIS — M8580 Other specified disorders of bone density and structure, unspecified site: Secondary | ICD-10-CM

## 2012-09-03 LAB — HM DEXA SCAN: HM Dexa Scan: NORMAL

## 2012-09-10 ENCOUNTER — Other Ambulatory Visit: Payer: Self-pay | Admitting: Medical

## 2012-09-14 ENCOUNTER — Other Ambulatory Visit: Payer: Self-pay | Admitting: Family Medicine

## 2012-09-14 DIAGNOSIS — L84 Corns and callosities: Secondary | ICD-10-CM | POA: Diagnosis not present

## 2012-09-14 DIAGNOSIS — B351 Tinea unguium: Secondary | ICD-10-CM | POA: Diagnosis not present

## 2012-09-14 DIAGNOSIS — E1149 Type 2 diabetes mellitus with other diabetic neurological complication: Secondary | ICD-10-CM | POA: Diagnosis not present

## 2012-09-14 MED ORDER — CALCIUM 1200 1200-1000 MG-UNIT PO CHEW: 1000.0000 mg | CHEWABLE_TABLET | Freq: Once | ORAL | Status: DC

## 2012-09-15 ENCOUNTER — Other Ambulatory Visit: Payer: Self-pay | Admitting: Medical

## 2012-09-15 ENCOUNTER — Telehealth: Payer: Self-pay | Admitting: Internal Medicine

## 2012-09-15 MED ORDER — AMOXICILLIN 875 MG PO TABS: 875.0000 mg | ORAL_TABLET | Freq: Two times a day (BID) | ORAL | Status: DC

## 2012-09-15 NOTE — Telephone Encounter (Signed)
What other symptoms?  Fever, chills, productive sputum, nausea, vomiting?  How many days of symptoms?  Does she have inhaler, has she used this in the past?

## 2012-09-15 NOTE — Telephone Encounter (Signed)
i sent amoxicillin to pharmacy to cover for respiratory infection  Recheck if not improving in a few days

## 2012-09-15 NOTE — Telephone Encounter (Signed)
[name] from [facility] called because she stated that [name] is having chest congestion and wheezing and coughing a lot at night time and she has been taking something like tussion for it and it hasn't seemed to get better and wanted to know if you would prescribe something for her.

## 2012-09-15 NOTE — Telephone Encounter (Signed)
The only SX that she is having is coughing and the wheezing. She has been like this for about a week and yes she has a inhaler that uses as needed. (Proair). CLS

## 2012-09-16 NOTE — Telephone Encounter (Signed)
I called the caregiver's and made them aware that a RX was sent to the pharmacy for the patient. CLS

## 2012-09-17 ENCOUNTER — Telehealth: Payer: Self-pay | Admitting: Medical

## 2012-09-17 NOTE — Telephone Encounter (Signed)
LM

## 2012-09-18 ENCOUNTER — Encounter: Payer: Self-pay | Admitting: Medical

## 2012-09-18 ENCOUNTER — Ambulatory Visit
Admission: RE | Admit: 2012-09-18 | Discharge: 2012-09-18 | Disposition: A | Discharge status: Home / Self Care | Payer: Medicare Other | Source: Ambulatory Visit | Attending: Medical | Admitting: Medical

## 2012-09-18 ENCOUNTER — Ambulatory Visit (INDEPENDENT_AMBULATORY_CARE_PROVIDER_SITE_OTHER): Payer: Medicare Other | Admitting: Medical

## 2012-09-18 VITALS — BP 112/60 | HR 60 | Temp 98.0°F | Resp 16 | Wt 211.0 lb

## 2012-09-18 DIAGNOSIS — S8990XA Unspecified injury of unspecified lower leg, initial encounter: Secondary | ICD-10-CM

## 2012-09-18 DIAGNOSIS — M79609 Pain in unspecified limb: Secondary | ICD-10-CM | POA: Diagnosis not present

## 2012-09-18 DIAGNOSIS — S99919A Unspecified injury of unspecified ankle, initial encounter: Secondary | ICD-10-CM | POA: Diagnosis not present

## 2012-09-18 DIAGNOSIS — D692 Other nonthrombocytopenic purpura: Secondary | ICD-10-CM

## 2012-09-18 DIAGNOSIS — Z7901 Long term (current) use of anticoagulants: Secondary | ICD-10-CM

## 2012-09-18 DIAGNOSIS — R062 Wheezing: Secondary | ICD-10-CM

## 2012-09-18 DIAGNOSIS — M7989 Other specified soft tissue disorders: Secondary | ICD-10-CM | POA: Diagnosis not present

## 2012-09-18 DIAGNOSIS — Z9229 Personal history of other drug therapy: Secondary | ICD-10-CM

## 2012-09-18 DIAGNOSIS — S8012XA Contusion of left lower leg, initial encounter: Secondary | ICD-10-CM

## 2012-09-18 DIAGNOSIS — S99929A Unspecified injury of unspecified foot, initial encounter: Secondary | ICD-10-CM | POA: Diagnosis not present

## 2012-09-18 DIAGNOSIS — S8010XA Contusion of unspecified lower leg, initial encounter: Secondary | ICD-10-CM | POA: Diagnosis not present

## 2012-09-18 LAB — CBC WITH DIFFERENTIAL/PLATELET
Eosinophils Absolute: 0.1 10*3/uL (ref 0.0–0.7)
Hemoglobin: 11.1 g/dL — ABNORMAL LOW (ref 12.0–15.0)
Lymphocytes Relative: 16 % (ref 12–46)
Lymphs Abs: 0.9 10*3/uL (ref 0.7–4.0)
MCH: 29.7 pg (ref 26.0–34.0)
Neutro Abs: 4.2 10*3/uL (ref 1.7–7.7)
Neutrophils Relative %: 73 % (ref 43–77)
Platelets: 131 10*3/uL — ABNORMAL LOW (ref 150–400)
RBC: 3.74 MIL/uL — ABNORMAL LOW (ref 3.87–5.11)
WBC: 5.8 10*3/uL (ref 4.0–10.5)

## 2012-09-18 LAB — BASIC METABOLIC PANEL
Calcium: 9.5 mg/dL (ref 8.4–10.5)
Chloride: 92 mEq/L — ABNORMAL LOW (ref 96–112)
Creat: 1.26 mg/dL — ABNORMAL HIGH (ref 0.50–1.10)
Sodium: 129 mEq/L — ABNORMAL LOW (ref 135–145)

## 2012-09-18 LAB — PROTIME-INR
INR: 2.59 — ABNORMAL HIGH (ref <1.50)
Prothrombin Time: 26.7 seconds — ABNORMAL HIGH (ref 11.6–15.2)

## 2012-09-18 LAB — HEPATIC FUNCTION PANEL
ALT: 9 U/L (ref 0–35)
Total Protein: 6.6 g/dL (ref 6.0–8.3)

## 2012-09-18 LAB — SEDIMENTATION RATE: Sed Rate: 47 mm/hr — ABNORMAL HIGH (ref 0–22)

## 2012-09-18 NOTE — Progress Notes (Signed)
Subjective: Here for rash on leg.  This week she has had respiratory illness, called in and I sent amoxicillin which has seemed to help.  She is some better, but not 100%.  Using inhaler some.  She apparently fell and injured left lower leg 2 wk ago.  Has had bruising of the leg which seemed to heal, but not bruised again.  The area is a little swollen and tender.  However just last evening started getting purplish rash on left lower and somewhat on right lower leg.  Doesn't hurt but resembles some of the bruising she gets on arms at times from coumadin.  No new med changes in last week.  No other symptoms.   Past Medical History  Diagnosis Date  . Anemia   . PVD (peripheral vascular disease)   . Hypertension   . Asthma   . Diabetes mellitus   . GERD (gastroesophageal reflux disease)   . Depression   . Mild mental retardation   . COPD (chronic obstructive pulmonary disease)   . Hx of deep venous thrombosis   . Mood disorder   . Constipation   . Chronic kidney disease   . Thrombocytopenia   . Cardiomyopathy   . Allergy    ROS as in HPI   Objective:   Physical Exam  Filed Vitals:   09/18/12 1131  BP: 112/60  Pulse: 60  Temp: 98 F (36.7 C)  Resp: 16    General appearance: alert, no distress, WD/WN HEENT: normocephalic, sclerae anicteric, TMs pearly, nares patent, no discharge or erythema, pharynx normal Neck: supple, no lymphadenopathy, no thyromegaly, no masses, no JVD Heart: RRR, normal S1, S2, no murmurs Lungs: scattered wheezes, no rhonchi, or rales Left lower leg, anterolateral proximal region with raised round tender area approx 8cm diameter, suggestive of contusion, slight purplish ecchymosis Skin: left lower anteromedial leg with 5cm round patch of purplish nonblanchable coloration and surrounding small pinpoint purpuric coloration.  Faint similar right lower anterior leg with purpura   Assessment and Plan :    Encounter Diagnoses  Name Primary?  . Purpura Yes    . Leg injury   . Contusion of leg, left   . Wheezing   . HX: long term anticoagulant use    Purpura - labs today.  Could be multifactorial, but suspect senile purpura.   Leg injury, contusion left leg - will send for left tib/fib xray  Wheezing - 1 round of albuterol nebulized given with some improvement.  Will send for CXR.  Order for albuterol 2 puffs TID until Monday, then prn use.   PT/INR today  Follow-up pending studies

## 2012-09-22 ENCOUNTER — Other Ambulatory Visit: Payer: Self-pay | Admitting: Medical

## 2012-10-06 DIAGNOSIS — IMO0002 Reserved for concepts with insufficient information to code with codable children: Secondary | ICD-10-CM | POA: Diagnosis not present

## 2012-10-07 ENCOUNTER — Other Ambulatory Visit: Payer: Self-pay | Admitting: Family Medicine

## 2012-10-08 ENCOUNTER — Telehealth: Payer: Self-pay | Admitting: Family Medicine

## 2012-10-08 DIAGNOSIS — F329 Major depressive disorder, single episode, unspecified: Secondary | ICD-10-CM | POA: Diagnosis not present

## 2012-10-08 DIAGNOSIS — F29 Unspecified psychosis not due to a substance or known physiological condition: Secondary | ICD-10-CM | POA: Diagnosis not present

## 2012-10-09 MED ORDER — OMEPRAZOLE 20 MG PO CPDR: 20.0000 mg | DELAYED_RELEASE_CAPSULE | Freq: Every day | ORAL | Status: DC

## 2012-10-09 NOTE — Telephone Encounter (Signed)
She has been noted to have more trouble with burping lately. I will place her back on Prilosec

## 2012-10-09 NOTE — Telephone Encounter (Signed)
MESSAGE LEFT

## 2012-10-09 NOTE — Telephone Encounter (Signed)
Let her know that I called the medication in. 

## 2012-10-13 ENCOUNTER — Other Ambulatory Visit: Payer: Self-pay | Admitting: Medical

## 2012-10-14 DIAGNOSIS — R259 Unspecified abnormal involuntary movements: Secondary | ICD-10-CM | POA: Diagnosis not present

## 2012-10-16 ENCOUNTER — Ambulatory Visit (INDEPENDENT_AMBULATORY_CARE_PROVIDER_SITE_OTHER): Payer: Medicare Other | Admitting: Medical

## 2012-10-16 ENCOUNTER — Encounter: Payer: Self-pay | Admitting: Medical

## 2012-10-16 ENCOUNTER — Other Ambulatory Visit: Payer: Self-pay | Admitting: Family Medicine

## 2012-10-16 VITALS — BP 112/60 | HR 68 | Temp 97.7°F | Wt 208.0 lb

## 2012-10-16 DIAGNOSIS — Z79899 Other long term (current) drug therapy: Secondary | ICD-10-CM | POA: Diagnosis not present

## 2012-10-16 DIAGNOSIS — K219 Gastro-esophageal reflux disease without esophagitis: Secondary | ICD-10-CM | POA: Diagnosis not present

## 2012-10-16 DIAGNOSIS — R110 Nausea: Secondary | ICD-10-CM

## 2012-10-16 DIAGNOSIS — Z9229 Personal history of other drug therapy: Secondary | ICD-10-CM

## 2012-10-16 DIAGNOSIS — Z7901 Long term (current) use of anticoagulants: Secondary | ICD-10-CM | POA: Diagnosis not present

## 2012-10-16 DIAGNOSIS — K3184 Gastroparesis: Secondary | ICD-10-CM

## 2012-10-16 LAB — PROTIME-INR
INR: 3 — ABNORMAL HIGH (ref <1.50)
Prothrombin Time: 28.9 seconds — ABNORMAL HIGH (ref 11.6–15.2)

## 2012-10-16 NOTE — Progress Notes (Signed)
Subjective: Here for abdominal discomfort.  Been c/o upset stomach the last few days, usually within minutes after eating.  Caregiver wonders if this is her wanting attention vs real c/o.   She has some nausea.  No other symptoms.  BMs are regular, 2-3 BM daily.  No GU c/o.  Also needs PT/INR today.   Past Medical History  Diagnosis Date  . Anemia   . PVD (peripheral vascular disease)   . Hypertension   . Asthma   . Diabetes mellitus   . GERD (gastroesophageal reflux disease)   . Depression   . Mild mental retardation   . COPD (chronic obstructive pulmonary disease)   . Hx of deep venous thrombosis   . Mood disorder   . Constipation   . Chronic kidney disease   . Thrombocytopenia   . Cardiomyopathy   . Allergy    ROS as in HPI  following portions of the patient's history were reviewed and updated as appropriate: allergies, current medications, past family history, past medical history, past social history, past surgical history and problem list.  Past Medical History  Diagnosis Date  . Anemia   . PVD (peripheral vascular disease)   . Hypertension   . Asthma   . Diabetes mellitus   . GERD (gastroesophageal reflux disease)   . Depression   . Mild mental retardation   . COPD (chronic obstructive pulmonary disease)   . Hx of deep venous thrombosis   . Mood disorder   . Constipation   . Chronic kidney disease   . Thrombocytopenia   . Cardiomyopathy   . Allergy     Allergies  Allergen Reactions  . Levofloxacin    Review of Systems ROS reviewed and was negative other than noted in HPI or above.    Objective:   Physical Exam  General appearance: alert, no distress, WD/WN Neck: supple, no lymphadenopathy, no thyromegaly, no masses Lungs: CTA bilaterally, no wheezes, rhonchi, or rales Abdomen: +bs, soft, non tender, non distended, no masses, no hepatomegaly, no splenomegaly  Assessment and Plan :     Encounter Diagnoses  Name Primary?  . Nausea Yes  .  Gastroparesis   . GERD (gastroesophageal reflux disease)   . Polypharmacy   . HX: long term anticoagulant use    Discussed symptoms and possible causes.  She just started back on omeprazole a week ago.  Discussed small portions, chewing food thoroughly, small portions, avoid very bulky foods.  If not improving, consider adding back Reglan given hx/o gastroparesis.

## 2012-10-16 NOTE — Patient Instructions (Addendum)
We will call with coumadin lab results  Take the omeprazole/Prilosec 30-45 minutes prior to breakfast.    Eat small portions and chew food slowly.  Avoid heavy bulky meals, avoid excess fiber from raw vegetables, too much breads and lots of food, as this can make digestion harder.   If not improving in 1-2 weeks, we can consider adding back on Reglan.  (she has a history of delayed gastric emptying).

## 2012-10-22 ENCOUNTER — Other Ambulatory Visit: Payer: Self-pay

## 2012-10-22 MED ORDER — CLONAZEPAM 0.5 MG PO TABS: 0.5000 mg | ORAL_TABLET | Freq: Two times a day (BID) | ORAL | Status: DC | PRN

## 2012-10-22 NOTE — Telephone Encounter (Signed)
MED CALLED IN AND [name] WAS INFORMED

## 2012-10-22 NOTE — Telephone Encounter (Signed)
Okay to renew this

## 2012-10-22 NOTE — Telephone Encounter (Signed)
Pt has been on clozapam .5 1 at bedtime [name] from [facility] said pt needs enough for 2 weeks till she can get in to her new psychiatrist please advise

## 2012-10-26 ENCOUNTER — Other Ambulatory Visit: Payer: Self-pay | Admitting: Family Medicine

## 2012-11-02 DIAGNOSIS — F329 Major depressive disorder, single episode, unspecified: Secondary | ICD-10-CM | POA: Diagnosis not present

## 2012-11-02 DIAGNOSIS — F29 Unspecified psychosis not due to a substance or known physiological condition: Secondary | ICD-10-CM | POA: Diagnosis not present

## 2012-11-03 ENCOUNTER — Other Ambulatory Visit: Payer: Self-pay | Admitting: Family Medicine

## 2012-11-10 DIAGNOSIS — IMO0002 Reserved for concepts with insufficient information to code with codable children: Secondary | ICD-10-CM | POA: Diagnosis not present

## 2012-11-10 DIAGNOSIS — M9420 Chondromalacia, unspecified site: Secondary | ICD-10-CM | POA: Diagnosis not present

## 2012-11-12 ENCOUNTER — Other Ambulatory Visit: Payer: Self-pay | Admitting: Orthopedic Surgery

## 2012-11-12 ENCOUNTER — Encounter (HOSPITAL_BASED_OUTPATIENT_CLINIC_OR_DEPARTMENT_OTHER): Payer: Self-pay | Admitting: *Deleted

## 2012-11-12 NOTE — Progress Notes (Signed)
Talked with pts sister in law-legal guardian-she will bring papers and come sign surgery permit-director of group home-[name] Elease Hashimoto will be here and take her back Pt is mentally handicapped, but cooperative and calm.they will bring her by day prior to surgery to get pt ptt [id number]

## 2012-11-17 ENCOUNTER — Encounter (HOSPITAL_BASED_OUTPATIENT_CLINIC_OR_DEPARTMENT_OTHER)
Admission: RE | Admit: 2012-11-17 | Discharge: 2012-11-17 | Disposition: A | Discharge status: Home / Self Care | Payer: Medicare Other | Source: Ambulatory Visit | Attending: Orthopedic Surgery | Admitting: Orthopedic Surgery

## 2012-11-17 DIAGNOSIS — I129 Hypertensive chronic kidney disease with stage 1 through stage 4 chronic kidney disease, or unspecified chronic kidney disease: Secondary | ICD-10-CM | POA: Diagnosis not present

## 2012-11-17 DIAGNOSIS — Z01810 Encounter for preprocedural cardiovascular examination: Secondary | ICD-10-CM | POA: Diagnosis not present

## 2012-11-17 DIAGNOSIS — J449 Chronic obstructive pulmonary disease, unspecified: Secondary | ICD-10-CM | POA: Diagnosis not present

## 2012-11-17 DIAGNOSIS — N189 Chronic kidney disease, unspecified: Secondary | ICD-10-CM | POA: Diagnosis not present

## 2012-11-17 DIAGNOSIS — M9420 Chondromalacia, unspecified site: Secondary | ICD-10-CM | POA: Diagnosis not present

## 2012-11-17 DIAGNOSIS — I739 Peripheral vascular disease, unspecified: Secondary | ICD-10-CM | POA: Diagnosis not present

## 2012-11-17 DIAGNOSIS — M7130 Other bursal cyst, unspecified site: Secondary | ICD-10-CM | POA: Diagnosis not present

## 2012-11-17 DIAGNOSIS — M23329 Other meniscus derangements, posterior horn of medial meniscus, unspecified knee: Secondary | ICD-10-CM | POA: Diagnosis not present

## 2012-11-17 DIAGNOSIS — Z86718 Personal history of other venous thrombosis and embolism: Secondary | ICD-10-CM | POA: Diagnosis not present

## 2012-11-17 DIAGNOSIS — K219 Gastro-esophageal reflux disease without esophagitis: Secondary | ICD-10-CM | POA: Diagnosis not present

## 2012-11-17 DIAGNOSIS — M2340 Loose body in knee, unspecified knee: Secondary | ICD-10-CM | POA: Diagnosis not present

## 2012-11-17 DIAGNOSIS — M23302 Other meniscus derangements, unspecified lateral meniscus, unspecified knee: Secondary | ICD-10-CM | POA: Diagnosis not present

## 2012-11-17 DIAGNOSIS — E119 Type 2 diabetes mellitus without complications: Secondary | ICD-10-CM | POA: Diagnosis not present

## 2012-11-17 DIAGNOSIS — M23359 Other meniscus derangements, posterior horn of lateral meniscus, unspecified knee: Secondary | ICD-10-CM | POA: Diagnosis not present

## 2012-11-17 LAB — BASIC METABOLIC PANEL
BUN: 33 mg/dL — ABNORMAL HIGH (ref 6–23)
CO2: 27 mEq/L (ref 19–32)
GFR calc non Af Amer: 31 mL/min — ABNORMAL LOW (ref >90)
Glucose, Bld: 120 mg/dL — ABNORMAL HIGH (ref 70–99)
Potassium: 5 mEq/L (ref 3.5–5.1)
Sodium: 136 mEq/L (ref 135–145)

## 2012-11-17 LAB — APTT: aPTT: 38 seconds — ABNORMAL HIGH (ref 24–37)

## 2012-11-17 LAB — PROTIME-INR: INR: 3.4 — ABNORMAL HIGH (ref 0.00–1.49)
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
## 2012-11-17 NOTE — Progress Notes (Signed)
Pt did not stop coumadin, I spoke with Darl Pikes from Dr. Turner Daniels office he is aware and no charnge to surg

## 2012-11-18 ENCOUNTER — Encounter (HOSPITAL_BASED_OUTPATIENT_CLINIC_OR_DEPARTMENT_OTHER): Payer: Self-pay | Admitting: *Deleted

## 2012-11-18 ENCOUNTER — Ambulatory Visit (HOSPITAL_BASED_OUTPATIENT_CLINIC_OR_DEPARTMENT_OTHER): Payer: Medicare Other | Admitting: Anesthesiology

## 2012-11-18 ENCOUNTER — Encounter (HOSPITAL_BASED_OUTPATIENT_CLINIC_OR_DEPARTMENT_OTHER): Payer: Self-pay | Admitting: Anesthesiology

## 2012-11-18 ENCOUNTER — Encounter (HOSPITAL_BASED_OUTPATIENT_CLINIC_OR_DEPARTMENT_OTHER)
Admission: RE | Disposition: A | Discharge status: Home / Self Care | Payer: Self-pay | Source: Ambulatory Visit | Attending: Orthopedic Surgery

## 2012-11-18 ENCOUNTER — Ambulatory Visit (HOSPITAL_BASED_OUTPATIENT_CLINIC_OR_DEPARTMENT_OTHER)
Admission: RE | Admit: 2012-11-18 | Discharge: 2012-11-18 | Disposition: A | Discharge status: Home / Self Care | Payer: Medicare Other | Source: Ambulatory Visit | Attending: Orthopedic Surgery | Admitting: Orthopedic Surgery

## 2012-11-18 DIAGNOSIS — M23329 Other meniscus derangements, posterior horn of medial meniscus, unspecified knee: Secondary | ICD-10-CM | POA: Diagnosis not present

## 2012-11-18 DIAGNOSIS — E119 Type 2 diabetes mellitus without complications: Secondary | ICD-10-CM | POA: Insufficient documentation

## 2012-11-18 DIAGNOSIS — N189 Chronic kidney disease, unspecified: Secondary | ICD-10-CM | POA: Insufficient documentation

## 2012-11-18 DIAGNOSIS — M2340 Loose body in knee, unspecified knee: Secondary | ICD-10-CM | POA: Insufficient documentation

## 2012-11-18 DIAGNOSIS — M9420 Chondromalacia, unspecified site: Secondary | ICD-10-CM | POA: Diagnosis not present

## 2012-11-18 DIAGNOSIS — S83289A Other tear of lateral meniscus, current injury, unspecified knee, initial encounter: Secondary | ICD-10-CM | POA: Diagnosis not present

## 2012-11-18 DIAGNOSIS — M23359 Other meniscus derangements, posterior horn of lateral meniscus, unspecified knee: Secondary | ICD-10-CM | POA: Insufficient documentation

## 2012-11-18 DIAGNOSIS — I129 Hypertensive chronic kidney disease with stage 1 through stage 4 chronic kidney disease, or unspecified chronic kidney disease: Secondary | ICD-10-CM | POA: Insufficient documentation

## 2012-11-18 DIAGNOSIS — Z86718 Personal history of other venous thrombosis and embolism: Secondary | ICD-10-CM | POA: Insufficient documentation

## 2012-11-18 DIAGNOSIS — S83207A Unspecified tear of unspecified meniscus, current injury, left knee, initial encounter: Secondary | ICD-10-CM

## 2012-11-18 DIAGNOSIS — J4489 Other specified chronic obstructive pulmonary disease: Secondary | ICD-10-CM | POA: Insufficient documentation

## 2012-11-18 DIAGNOSIS — K219 Gastro-esophageal reflux disease without esophagitis: Secondary | ICD-10-CM | POA: Insufficient documentation

## 2012-11-18 DIAGNOSIS — M23302 Other meniscus derangements, unspecified lateral meniscus, unspecified knee: Secondary | ICD-10-CM | POA: Insufficient documentation

## 2012-11-18 DIAGNOSIS — M2240 Chondromalacia patellae, unspecified knee: Secondary | ICD-10-CM | POA: Diagnosis not present

## 2012-11-18 DIAGNOSIS — M7130 Other bursal cyst, unspecified site: Secondary | ICD-10-CM | POA: Insufficient documentation

## 2012-11-18 DIAGNOSIS — I739 Peripheral vascular disease, unspecified: Secondary | ICD-10-CM | POA: Insufficient documentation

## 2012-11-18 DIAGNOSIS — J449 Chronic obstructive pulmonary disease, unspecified: Secondary | ICD-10-CM | POA: Insufficient documentation

## 2012-11-18 DIAGNOSIS — IMO0002 Reserved for concepts with insufficient information to code with codable children: Secondary | ICD-10-CM | POA: Diagnosis not present

## 2012-11-18 DIAGNOSIS — Z01810 Encounter for preprocedural cardiovascular examination: Secondary | ICD-10-CM | POA: Insufficient documentation

## 2012-11-18 HISTORY — PX: KNEE ARTHROSCOPY: SHX127

## 2012-11-18 LAB — GLUCOSE, CAPILLARY: Glucose-Capillary: 122 mg/dL — ABNORMAL HIGH (ref 70–99)

## 2012-11-18 SURGERY — ARTHROSCOPY, KNEE
Anesthesia: General | Site: Knee | Laterality: Left | Wound class: Clean

## 2012-11-18 MED ORDER — OXYCODONE HCL 5 MG PO TABS: 5.0000 mg | ORAL_TABLET | Freq: Once | ORAL | Status: DC | PRN

## 2012-11-18 MED ORDER — HYDROMORPHONE HCL PF 1 MG/ML IJ SOLN: 0.2500 mg | INTRAMUSCULAR | Status: DC | PRN

## 2012-11-18 MED ORDER — PROPOFOL 10 MG/ML IV BOLUS
INTRAVENOUS | Status: DC | PRN
  Administered 2012-11-18: 150 mg via INTRAVENOUS

## 2012-11-18 MED ORDER — DEXAMETHASONE SODIUM PHOSPHATE 4 MG/ML IJ SOLN
INTRAMUSCULAR | Status: DC | PRN
  Administered 2012-11-18: 10 mg via INTRAVENOUS

## 2012-11-18 MED ORDER — OXYCODONE HCL 5 MG/5ML PO SOLN: 5.0000 mg | Freq: Once | ORAL | Status: DC | PRN

## 2012-11-18 MED ORDER — EPHEDRINE SULFATE 50 MG/ML IJ SOLN
INTRAMUSCULAR | Status: DC | PRN
  Administered 2012-11-18: 10 mg via INTRAVENOUS

## 2012-11-18 MED ORDER — LIDOCAINE HCL (CARDIAC) 20 MG/ML IV SOLN
INTRAVENOUS | Status: DC | PRN
  Administered 2012-11-18: 60 mg via INTRAVENOUS

## 2012-11-18 MED ORDER — EPINEPHRINE HCL 1 MG/ML IJ SOLN
INTRAMUSCULAR | Status: DC | PRN
  Administered 2012-11-18: 1 mL

## 2012-11-18 MED ORDER — DEXTROSE-NACL 5-0.45 % IV SOLN: INTRAVENOUS | Status: DC

## 2012-11-18 MED ORDER — SODIUM CHLORIDE 0.9 % IR SOLN
Status: DC | PRN
  Administered 2012-11-18: 6

## 2012-11-18 MED ORDER — LACTATED RINGERS IV SOLN
INTRAVENOUS | Status: DC
  Administered 2012-11-18: 11:00:00 via INTRAVENOUS

## 2012-11-18 MED ORDER — FENTANYL CITRATE 0.05 MG/ML IJ SOLN
INTRAMUSCULAR | Status: DC | PRN
  Administered 2012-11-18: 100 ug via INTRAVENOUS

## 2012-11-18 MED ORDER — ACETAMINOPHEN 10 MG/ML IV SOLN
1000.0000 mg | Freq: Once | INTRAVENOUS | Status: AC
  Administered 2012-11-18: 1000 mg via INTRAVENOUS

## 2012-11-18 MED ORDER — HYDROCODONE-ACETAMINOPHEN 5-325 MG PO TABS: 1.0000 | ORAL_TABLET | ORAL | Status: AC | PRN

## 2012-11-18 MED ORDER — ONDANSETRON HCL 4 MG/2ML IJ SOLN
INTRAMUSCULAR | Status: DC | PRN
  Administered 2012-11-18: 4 mg via INTRAVENOUS

## 2012-11-18 MED ORDER — CEFAZOLIN SODIUM-DEXTROSE 2-3 GM-% IV SOLR
2.0000 g | INTRAVENOUS | Status: AC
  Administered 2012-11-18: 2 g via INTRAVENOUS

## 2012-11-18 MED ORDER — CHLORHEXIDINE GLUCONATE 4 % EX LIQD: 60.0000 mL | Freq: Once | CUTANEOUS | Status: DC

## 2012-11-18 SURGICAL SUPPLY — 39 items
BANDAGE ELASTIC 6 VELCRO ST LF (GAUZE/BANDAGES/DRESSINGS) ×2 IMPLANT
BLADE 4.2CUDA (BLADE) IMPLANT
BLADE CUTTER GATOR 3.5 (BLADE) ×2 IMPLANT
BLADE GREAT WHITE 4.2 (BLADE) ×2 IMPLANT
CANISTER OMNI JUG 16 LITER (MISCELLANEOUS) ×2 IMPLANT
CANISTER SUCTION 2500CC (MISCELLANEOUS) IMPLANT
CHLORAPREP W/TINT 26ML (MISCELLANEOUS) ×2 IMPLANT
CLOTH BEACON ORANGE TIMEOUT ST (SAFETY) ×2 IMPLANT
DRAPE ARTHROSCOPY W/POUCH 114 (DRAPES) ×2 IMPLANT
ELECT MENISCUS 165MM 90D (ELECTRODE) IMPLANT
ELECT REM PT RETURN 9FT ADLT (ELECTROSURGICAL)
ELECTRODE REM PT RTRN 9FT ADLT (ELECTROSURGICAL) IMPLANT
GAUZE XEROFORM 1X8 LF (GAUZE/BANDAGES/DRESSINGS) ×2 IMPLANT
GLOVE BIO SURGEON STRL SZ 6.5 (GLOVE) ×2 IMPLANT
GLOVE BIO SURGEON STRL SZ7 (GLOVE) ×2 IMPLANT
GLOVE BIO SURGEON STRL SZ7.5 (GLOVE) ×2 IMPLANT
GLOVE BIOGEL PI IND STRL 7.0 (GLOVE) ×2 IMPLANT
GLOVE BIOGEL PI IND STRL 8 (GLOVE) ×1 IMPLANT
GLOVE BIOGEL PI INDICATOR 7.0 (GLOVE) ×2
GLOVE BIOGEL PI INDICATOR 8 (GLOVE) ×1
GOWN PREVENTION PLUS XLARGE (GOWN DISPOSABLE) ×6 IMPLANT
IV NS IRRIG 3000ML ARTHROMATIC (IV SOLUTION) IMPLANT
KNEE WRAP E Z 3 GEL PACK (MISCELLANEOUS) ×2 IMPLANT
NDL SAFETY ECLIPSE 18X1.5 (NEEDLE) IMPLANT
NEEDLE FILTER BLUNT 18X 1/2SAF (NEEDLE) ×1
NEEDLE FILTER BLUNT 18X1 1/2 (NEEDLE) ×1 IMPLANT
NEEDLE HYPO 18GX1.5 SHARP (NEEDLE)
PACK ARTHROSCOPY DSU (CUSTOM PROCEDURE TRAY) ×2 IMPLANT
PACK BASIN DAY SURGERY FS (CUSTOM PROCEDURE TRAY) ×2 IMPLANT
PENCIL BUTTON HOLSTER BLD 10FT (ELECTRODE) IMPLANT
SET ARTHROSCOPY TUBING (MISCELLANEOUS) ×1
SET ARTHROSCOPY TUBING LN (MISCELLANEOUS) ×1 IMPLANT
SLEEVE SCD COMPRESS KNEE MED (MISCELLANEOUS) IMPLANT
SPONGE GAUZE 4X4 12PLY (GAUZE/BANDAGES/DRESSINGS) ×2 IMPLANT
SYR 3ML 18GX1 1/2 (SYRINGE) IMPLANT
SYR 5ML LL (SYRINGE) ×2 IMPLANT
TOWEL OR 17X24 6PK STRL BLUE (TOWEL DISPOSABLE) ×2 IMPLANT
WAND STAR VAC 90 (SURGICAL WAND) IMPLANT
WATER STERILE IRR 1000ML POUR (IV SOLUTION) ×2 IMPLANT

## 2012-11-18 NOTE — Interval H&P Note (Signed)
History and Physical Interval Note:  11/18/2012 11:08 AM  [name]  has presented today for surgery, with the diagnosis of left knee lateral meniscal tear and chondromalacia   The various methods of treatment have been discussed with the patient and family. After consideration of risks, benefits and other options for treatment, the patient has consented to  Procedure(s) (LRB) with comments: ARTHROSCOPY KNEE (Left) as a surgical intervention .  The patient's history has been reviewed, patient examined, no change in status, stable for surgery.  I have reviewed the patient's chart and labs.  Questions were answered to the patient's satisfaction.     Nestor Lewandowsky

## 2012-11-18 NOTE — Op Note (Signed)
Pre-Op Dx:L knee chondromalacia with meniscal tears  Postop Dx: Left knee chondromalacia grade 3 with flap tears lateral tibial plateau, complex tear posterior and lateral horns lateral meniscus, simple tear degenerative medial meniscus, intra-articular loose bodies with synovial cyst   Procedure: Left knee arthroscopic partial medial and lateral meniscectomy, debridement chondromalacia, removal of loose bodies and synovial cyst  Surgeon: Feliberto Gottron. Turner Daniels M.D.  Assist: Shirl Harris PA-C  Anes: General LMA  EBL: Minimal  Fluids: 800 cc   Indications: Patient has had catching popping and pain lateral more than medial. She did well with arthroscopic decompression of the right knee in 2013 and desires same for the left. Pt has failed conservative treatment with anti-inflammatory medicines, physical therapy, and modified activites but did get good temporarily from an intra-articular cortisone injection. Pain has recurred and patient desires elective arthroscopic evaluation and treatment of knee. Risks and benefits of surgery have been discussed and questions answered.  Procedure: Patient identified by arm band and taken to the operating room at the day surgery Center. The appropriate anesthetic monitors were attached, and General LMA anesthesia was induced without difficulty. Lateral post was applied to the table and the lower extremity was prepped and draped in usual sterile fashion from the ankle to the midthigh. Time out procedure was performed. We began the operation by making standard inferior lateral and inferior medial peripatellar portals with a #11 blade allowing introduction of the arthroscope through the inferior lateral portal and the out flow to the inferior medial portal. Pump pressure was set at 100 mmHg and diagnostic arthroscopy  revealed normal articular cartilage to the patellofemoral joint, degenerative tearing posterior horn of the medial meniscus it was debrided back to a stable  margin with a 3.5 mm Gator sucker shaver. The anterior cruciate ligament and PCL are intact, a synovial cyst at the origin of the lateral meniscus was identified and removed with a 4.2 great-white sucker shaver. We also encountered a cartilaginous loose body that was removed at this time as well. The posterior and lateral horns of the lateral meniscus had complex parrot-beak and degenerative tearing debrided back to a stable margin with the 4.2 great-white sucker shaver and a 3.5 Gator sucker shaver.. The knee was irrigated out normal saline solution. A dressing of xerofoam 4 x 4 dressing sponges, web roll and an Ace wrap was applied. The patient was awakened extubated and taken to the recovery without difficulty.    Signed: Nestor Lewandowsky, MD

## 2012-11-18 NOTE — Transfer of Care (Signed)
Immediate Anesthesia Transfer of Care Note  Patient: [name]  Procedure(s) Performed: Procedure(s) (LRB): ARTHROSCOPY KNEE (Left)  Patient Location: PACU  Anesthesia Type: General  Level of Consciousness: awake, oriented, sedated and patient cooperative  Airway & Oxygen Therapy: Patient Spontanous Breathing and Patient connected to face mask oxygen  Post-op Assessment: Report given to PACU RN and Post -op Vital signs reviewed and stable  Post vital signs: Reviewed and stable  Complications: No apparent anesthesia complications

## 2012-11-18 NOTE — Anesthesia Preprocedure Evaluation (Signed)
Anesthesia Evaluation  Patient identified by MRN, date of birth, ID band Patient awake    Reviewed: Allergy & Precautions, H&P , NPO status , Patient's Chart, lab work & pertinent test results, reviewed documented beta blocker date and time   Airway Mallampati: I TM Distance: >3 FB Neck ROM: Full    Dental No notable dental hx. (+) Edentulous Upper, Edentulous Lower and Dental Advisory Given   Pulmonary asthma ,  breath sounds clear to auscultation  Pulmonary exam normal       Cardiovascular hypertension, On Medications and On Home Beta Blockers + Peripheral Vascular Disease negative cardio ROS  Rhythm:Regular Rate:Normal     Neuro/Psych PSYCHIATRIC DISORDERS negative neurological ROS     GI/Hepatic Neg liver ROS, GERD-  Medicated and Controlled,  Endo/Other  diabetes, Type 2, Oral Hypoglycemic Agents  Renal/GU Renal disease  negative genitourinary   Musculoskeletal   Abdominal   Peds  Hematology negative hematology ROS (+)   Anesthesia Other Findings   Reproductive/Obstetrics negative OB ROS                           Anesthesia Physical Anesthesia Plan  ASA: III  Anesthesia Plan: General   Post-op Pain Management:    Induction: Intravenous  Airway Management Planned: LMA  Additional Equipment:   Intra-op Plan:   Post-operative Plan: Extubation in OR  Informed Consent: I have reviewed the patients History and Physical, chart, labs and discussed the procedure including the risks, benefits and alternatives for the proposed anesthesia with the patient or authorized representative who has indicated his/her understanding and acceptance.   Dental advisory given  Plan Discussed with: CRNA  Anesthesia Plan Comments:         Anesthesia Quick Evaluation

## 2012-11-18 NOTE — Anesthesia Procedure Notes (Signed)
Procedure Name: LMA Insertion Date/Time: 11/18/2012 11:21 AM Performed by: Renella Cunas D Pre-anesthesia Checklist: Patient identified, Emergency Drugs available, Suction available and Patient being monitored Patient Re-evaluated:Patient Re-evaluated prior to inductionOxygen Delivery Method: Circle System Utilized Preoxygenation: Pre-oxygenation with 100% oxygen Intubation Type: IV induction Ventilation: Mask ventilation without difficulty LMA: LMA inserted LMA Size: 4.0 Number of attempts: 1 Airway Equipment and Method: bite block Placement Confirmation: positive ETCO2 Tube secured with: Tape Dental Injury: Teeth and Oropharynx as per pre-operative assessment

## 2012-11-18 NOTE — Anesthesia Postprocedure Evaluation (Signed)
  Anesthesia Post-op Note  Patient: [name]  Procedure(s) Performed: Procedure(s) (LRB) with comments: ARTHROSCOPY KNEE (Left)  Patient Location: PACU  Anesthesia Type:General  Level of Consciousness: awake and alert   Airway and Oxygen Therapy: Patient Spontanous Breathing  Post-op Pain: none  Post-op Assessment: Post-op Vital signs reviewed, Patient's Cardiovascular Status Stable, Respiratory Function Stable, Patent Airway and No signs of Nausea or vomiting  Post-op Vital Signs: Reviewed and stable  Complications: No apparent anesthesia complications

## 2012-11-19 ENCOUNTER — Encounter (HOSPITAL_BASED_OUTPATIENT_CLINIC_OR_DEPARTMENT_OTHER): Payer: Self-pay | Admitting: Orthopedic Surgery

## 2012-11-26 DIAGNOSIS — Z961 Presence of intraocular lens: Secondary | ICD-10-CM | POA: Diagnosis not present

## 2012-11-26 DIAGNOSIS — H04129 Dry eye syndrome of unspecified lacrimal gland: Secondary | ICD-10-CM | POA: Diagnosis not present

## 2012-12-07 ENCOUNTER — Other Ambulatory Visit: Payer: Self-pay | Admitting: Family Medicine

## 2012-12-07 DIAGNOSIS — L84 Corns and callosities: Secondary | ICD-10-CM | POA: Diagnosis not present

## 2012-12-07 DIAGNOSIS — B351 Tinea unguium: Secondary | ICD-10-CM | POA: Diagnosis not present

## 2012-12-07 DIAGNOSIS — M79609 Pain in unspecified limb: Secondary | ICD-10-CM | POA: Diagnosis not present

## 2012-12-07 DIAGNOSIS — E1149 Type 2 diabetes mellitus with other diabetic neurological complication: Secondary | ICD-10-CM | POA: Diagnosis not present

## 2012-12-15 ENCOUNTER — Other Ambulatory Visit: Payer: Medicare Other

## 2012-12-15 ENCOUNTER — Other Ambulatory Visit: Payer: Self-pay | Admitting: Medical

## 2012-12-15 DIAGNOSIS — Z79899 Other long term (current) drug therapy: Secondary | ICD-10-CM | POA: Diagnosis not present

## 2012-12-15 DIAGNOSIS — Z7901 Long term (current) use of anticoagulants: Secondary | ICD-10-CM | POA: Diagnosis not present

## 2012-12-15 NOTE — Telephone Encounter (Signed)
Last PT/INR was 11/17/12 from hospital. I assume they called her and adjusted the dose.  pls verify the dose she is currently taking.  This should be different from prior dose as her INR was too high most recently ordered by hospital.  I refilled the 1mg  dose today per refill request from pharmacy.

## 2012-12-15 NOTE — Telephone Encounter (Signed)
I called and I spoke with the care giver and she said that [name] is on 11 mg of coumadin currently. CLS

## 2012-12-15 NOTE — Telephone Encounter (Signed)
Is this okay to refill? 

## 2012-12-16 ENCOUNTER — Telehealth: Payer: Self-pay | Admitting: Oncology

## 2012-12-17 NOTE — Progress Notes (Signed)
Quick Note:  CALLED TALKED WITH ANITA AND EXPLAINED WORD FOR WORD The PT/INR is slowly going up. Make sure she is getting some greens in her diet. AND VERBALIZED UNDERSTANDING ______

## 2012-12-22 ENCOUNTER — Other Ambulatory Visit: Payer: Self-pay | Admitting: Medical

## 2012-12-31 DIAGNOSIS — M9420 Chondromalacia, unspecified site: Secondary | ICD-10-CM | POA: Diagnosis not present

## 2012-12-31 DIAGNOSIS — M25569 Pain in unspecified knee: Secondary | ICD-10-CM | POA: Diagnosis not present

## 2013-01-05 DIAGNOSIS — F329 Major depressive disorder, single episode, unspecified: Secondary | ICD-10-CM | POA: Diagnosis not present

## 2013-01-05 DIAGNOSIS — F29 Unspecified psychosis not due to a substance or known physiological condition: Secondary | ICD-10-CM | POA: Diagnosis not present

## 2013-01-11 ENCOUNTER — Other Ambulatory Visit: Payer: Self-pay | Admitting: Family Medicine

## 2013-01-12 ENCOUNTER — Other Ambulatory Visit (HOSPITAL_BASED_OUTPATIENT_CLINIC_OR_DEPARTMENT_OTHER): Payer: Medicare Other | Admitting: Lab

## 2013-01-12 ENCOUNTER — Ambulatory Visit (HOSPITAL_BASED_OUTPATIENT_CLINIC_OR_DEPARTMENT_OTHER): Payer: Medicare Other | Admitting: Physician Assistant

## 2013-01-12 VITALS — BP 117/51 | HR 60 | Temp 97.2°F | Resp 20 | Ht 72.0 in | Wt 207.4 lb

## 2013-01-12 DIAGNOSIS — D649 Anemia, unspecified: Secondary | ICD-10-CM

## 2013-01-12 DIAGNOSIS — D696 Thrombocytopenia, unspecified: Secondary | ICD-10-CM | POA: Diagnosis not present

## 2013-01-12 LAB — CBC WITH DIFFERENTIAL/PLATELET
BASO%: 0.2 % (ref 0.0–2.0)
LYMPH%: 10.2 % — ABNORMAL LOW (ref 14.0–49.7)
MCH: 30.4 pg (ref 25.1–34.0)
MCHC: 34.5 g/dL (ref 31.5–36.0)
MCV: 88.3 fL (ref 79.5–101.0)
MONO%: 7.7 % (ref 0.0–14.0)
Platelets: 105 10*3/uL — ABNORMAL LOW (ref 145–400)
RBC: 3.93 10*6/uL (ref 3.70–5.45)

## 2013-01-12 LAB — FERRITIN: Ferritin: 342 ng/mL — ABNORMAL HIGH (ref 10–291)

## 2013-01-12 LAB — COMPREHENSIVE METABOLIC PANEL (CC13)
ALT: 13 U/L (ref 0–55)
BUN: 29.7 mg/dL — ABNORMAL HIGH (ref 7.0–26.0)
CO2: 28 mEq/L (ref 22–29)
Calcium: 9.5 mg/dL (ref 8.4–10.4)
Chloride: 103 mEq/L (ref 98–107)
Creatinine: 1.6 mg/dL — ABNORMAL HIGH (ref 0.6–1.1)

## 2013-01-12 LAB — LACTATE DEHYDROGENASE (CC13): LDH: 194 U/L (ref 125–245)

## 2013-01-12 LAB — IRON AND TIBC: %SAT: 32 % (ref 20–55)

## 2013-01-12 NOTE — Patient Instructions (Addendum)
No further follow up needed, return prn. Your counts have been stable.

## 2013-01-12 NOTE — Progress Notes (Signed)
[facility]  [phone]  CC:   Sharlot Gowda, M.D.  OFFICE PROGRESS NOTE  PROBLEM LIST: 1. Anemia, possibly due to renal insufficiency. 2. Thrombocytopenia, most likely due to immune dysregulation, i.e., chronic low-grade ITP. 3. Renal insufficiency. 4. Mental retardation. 5. Right lower extremity DVT in September 2010. 6. Anticoagulation therapy with Coumadin. 7. Hypertension. 8. Diabetes mellitus. 9. COPD. 10.GERD. 11.History of cervical cancer ,remote 12 s/p Left knee arthroscopic partial medial and lateral meniscectomy, debridement chondromalacia, removal of loose bodies and synovial cyst, Dr. Rowan,,11/18/12  MEDICATIONS:  Current Outpatient Prescriptions  Medication Sig Dispense Refill  . ACCU-CHEK AVIVA PLUS test strip CHECK BLOOD GLUCOSE (SUGAR) 1 OR 2 TIMESDAILY  100 strip  prn  . buPROPion (WELLBUTRIN SR) 150 MG 12 hr tablet Take 150 mg by mouth daily after breakfast.        . Calcium Carbonate-Vit D-Min (CALCIUM 1200) 1200-1000 MG-UNIT CHEW Chew 1,000 mg by mouth once.  30 each  11  . DIOVAN 80 MG tablet TAKE ONE TABLET EVERY MORNING  30 tablet  4  . docusate sodium (COLACE) 100 MG capsule Take 100 mg by mouth 2 (two) times daily.        . ferrous sulfate 325 (65 FE) MG tablet Take 325 mg by mouth daily with breakfast.        . FLUoxetine (PROZAC) 10 MG capsule Take 10 mg by mouth daily. 10mg  plus 40 mg am      . FLUoxetine (PROZAC) 40 MG capsule Take 40 mg by mouth daily.        . fluticasone (FLONASE) 50 MCG/ACT nasal spray 1 OR 2 SPRAYS IN EACH NOSTRIL DAILY  16 g  1  . HYDROcodone-acetaminophen (NORCO/VICODIN) 5-325 MG per tablet       . ibandronate (BONIVA) 150 MG tablet ONE TABLET EVERY 30 DAYS. TAKE IN THE   MORNING WITH A FULL GLASS OR WATER ON ANEMPTY STOMACH.DO NOT TAKE ANYTHING ELSE BY MOUTH OR LIE DOWN  4 tablet  11  . isosorbide mononitrate (IMDUR) 30 MG 24 hr tablet TAKE ONE TABLET BY MOUTH ONCE DAILY  30 tablet  5  . LORazepam  (ATIVAN) 0.5 MG tablet Take 0.5 mg by mouth every morning. And as needed      . metoprolol tartrate (LOPRESSOR) 25 MG tablet TAKE ONE TABLET TWICE DAILY  60 tablet  11  . montelukast (SINGULAIR) 10 MG tablet TAKE ONE TABLET EVERY NIGHT  90 tablet  1  . nitroGLYCERIN (NITROSTAT) 0.4 MG SL tablet Place 0.4 mg under the tongue every 5 (five) minutes as needed.        Marland Kitchen omeprazole (PRILOSEC) 20 MG capsule Take 1 capsule (20 mg total) by mouth daily.  30 capsule  prn  . PRODIGY TWIST TOP LANCETS 28G MISC TWICE DAILY  100 each  prn  . QC LORATADINE ALLERGY RELIEF 10 MG tablet TAKE ONE TABLET EACH DAY  30 tablet  11  . simvastatin (ZOCOR) 20 MG tablet TAKE ONE TABLET BY MOUTH ONCE A DAY  30 tablet  5  . traMADol (ULTRAM) 50 MG tablet Take 1 tablet (50 mg total) by mouth every 6 (six) hours as needed.  30 tablet  3  . warfarin (COUMADIN) 1 MG tablet TAKE ONE TABLET EACH DAY  30 tablet  3  . warfarin (COUMADIN) 5 MG tablet TAKE 2 TABLETS EVERY NIGHT  60 tablet  1   No current facility-administered medications for this visit.    ALLERGIES:  Allergies  Allergen Reactions  . Levofloxacin    HISTORY:  [name] is a 78 year old, white female who resides at [facility], a group home.  The patient is here today with a caregiver,Toree Hyman Hopes.  The patient was last seen by [place] on 01/13/2012.  She is followed here for anemia and low-grade thrombocytopenia.  She was first seen by [place] on 04/12/2010.  We are following her without any specific therapy other than iron.  She has some degree of renal insufficiency,which may be the underlying cause for her anemia.  She may have immune dysregulation as the etiology of her thrombocytopenia. She has undergone bilateral  arthroscopic meniscal surgery in 11/18/2012, whith significant complication. She has also had a lateral cataract surgery with good results. The patient is really without any other complaints today.   PHYSICAL EXAMINATION:   Filed Vitals:   01/12/13 1016   BP: 117/51  Pulse: 60  Temp: 97.2 F (36.2 C)  Resp: 20   Filed Weights   01/12/13 1016  Weight: 207 lb 6.4 oz (94.076 kg)    PHYSICAL EXAMINATION:  General Appearance:  The patient is very pleasant and clearly has some degree of mental retardation. HEENT:  There is no scleral icterus.   Mouth and pharynx are benign.  No peripheral adenopathy palpable.  Heart:  Normal. Lungs:  Normal.  Abdomen:  Obese and nontender with no organomegaly or masses palpable.  Extremities:  No peripheral edema or clubbing. Neurologic Exam:  Nonfocal.  LABORATORY/RADIOLOGY DATA:   Recent Labs Lab 01/12/13 0940  WBC 7.5  HGB 12.0  HCT 34.7*  PLT 105*  MCV 88.3  MCH 30.4  MCHC 34.5  RDW 13.1  LYMPHSABS 0.8*  MONOABS 0.6  EOSABS 0.2  BASOSABS 0.0    CMP   No results found for this basename: NA, K, CL, CO2, GLUCOSE, BUN, CREATININE, GFRCGP, CALCIUM, MG, AST, ALT, ALKPHOS, BILITOT,  in the last 168 hours      Component Value Date/Time   BILITOT 0.4 09/18/2012 1238   BILIDIR 0.1 09/18/2012 1238   IBILI 0.3 09/18/2012 1238     On 01/13/12 white count 5, ANC 3.4, hemoglobin 10.9,hematocrit 32.7, platelets 115,000.  Chemistries on 01/13/12 were  notable for a BUN of 30, creatinine 1.42.  On 05/20/2011, the BUN was33, creatinine 1.62.  Iron studies and sed rate are pending.  Iron studies from 01/13/12 were showing an iron level is a 69, TIBC 242 saturation of 29 and ferritin 170. A followup labs on 07/15/2012 showed iron levels of 97 TIBC 287 saturation 34 and ferritin of 284. Sed rate on 09/18/2012 was 47, as compared to 05/20/2011 was 27.   Urinalysis    Component Value Date/Time   BILIRUBINUR neg 04/22/2012 1507   UROBILINOGEN negative 04/22/2012 1507   NITRITE neg 04/22/2012 1507   LEUKOCYTESUR small (1+) 04/22/2012 1507     Radiology Studies:  IMAGING STUDIES: 1. A bone density scan on 08/29/2010 gave a T-score of -1.3 of the    left femoral neck and a T-score of -0.8 of the left  forearm.  The    patient has osteopenia involving the left femoral neck. 2. Screening bilateral mammogram on 06/24/2011 was negative. 3.  Screening bilateral mammogram on9/19/2013 was negative. 4.A bone density scan on 09/13/2012 gave a T-score of -1.0 of the left femoral neck, Z 1.1 5. Left tibia fibula XR, negative results  ASSESSMENT AND PLAN:    IMPRESSION AND PLAN:  Ms. Boulay has mild asymptomatic anemia  and thrombocytopenia.  Iron studies and a sedimentation rate are currently  pending.  The patient is on polypharmacy, which may be contributing to  her thrombocytopenia.  We await the results of her iron studies. Dr. Derenda Fennel has seen and evaluated the patient, concluding that no followup is needed at this time, since her counts continued to be stable and no bleeding is noted. Thus, she is to have her primary care physician to continue to monitor his counts and if needed, we will be glad to see her at the office of the Cancer Center.it has been a pleasure to care for Ms Bazaldua.    Marlowe Kays E, PA-C 01/12/2013, 2:14 PM

## 2013-01-13 ENCOUNTER — Telehealth: Payer: Self-pay | Admitting: Family Medicine

## 2013-01-13 ENCOUNTER — Other Ambulatory Visit: Payer: Medicare Other

## 2013-01-13 DIAGNOSIS — Z7901 Long term (current) use of anticoagulants: Secondary | ICD-10-CM

## 2013-01-13 LAB — PROTIME-INR: Prothrombin Time: 23.9 seconds — ABNORMAL HIGH (ref 11.6–15.2)

## 2013-01-13 MED ORDER — ALBUTEROL SULFATE HFA 108 (90 BASE) MCG/ACT IN AERS: 2.0000 | INHALATION_SPRAY | Freq: Four times a day (QID) | RESPIRATORY_TRACT | Status: DC | PRN

## 2013-01-13 NOTE — Telephone Encounter (Signed)
Sent in inhaler.

## 2013-01-13 NOTE — Telephone Encounter (Signed)
Message copied by Janeice Robinson on Wed Jan 13, 2013  8:15 AM ------      Message from: Jac Canavan      Created: Tue Jan 12, 2013  7:28 PM       Time for PT/INR ------

## 2013-01-13 NOTE — Telephone Encounter (Signed)
Message copied by Janeice Robinson on Wed Jan 13, 2013  8:14 AM ------      Message from: Jac Canavan      Created: Tue Jan 12, 2013  7:28 PM       Time for PT/INR ------

## 2013-01-13 NOTE — Telephone Encounter (Signed)
[name] at the [facility] is aware and they are coming in this afternoon. CLS

## 2013-01-15 ENCOUNTER — Telehealth: Payer: Self-pay | Admitting: Family Medicine

## 2013-01-15 NOTE — Telephone Encounter (Signed)
Care giver at the [facility] states that [name]'s Coumadin dose is 11 mg a day. CLS

## 2013-01-22 DIAGNOSIS — F29 Unspecified psychosis not due to a substance or known physiological condition: Secondary | ICD-10-CM | POA: Diagnosis not present

## 2013-01-22 DIAGNOSIS — F329 Major depressive disorder, single episode, unspecified: Secondary | ICD-10-CM | POA: Diagnosis not present

## 2013-01-25 ENCOUNTER — Other Ambulatory Visit: Payer: Self-pay | Admitting: Medical

## 2013-01-25 ENCOUNTER — Telehealth: Payer: Self-pay | Admitting: Medical

## 2013-01-25 MED ORDER — FLUTICASONE PROPIONATE 50 MCG/ACT NA SUSP: 1.0000 | Freq: Every day | NASAL | Status: DC

## 2013-01-27 ENCOUNTER — Ambulatory Visit (INDEPENDENT_AMBULATORY_CARE_PROVIDER_SITE_OTHER): Payer: Medicare Other | Admitting: Family Medicine

## 2013-01-27 ENCOUNTER — Encounter: Payer: Self-pay | Admitting: Family Medicine

## 2013-01-27 VITALS — BP 130/70 | HR 72 | Temp 97.9°F | Ht 70.0 in | Wt 210.0 lb

## 2013-01-27 DIAGNOSIS — R300 Dysuria: Secondary | ICD-10-CM | POA: Diagnosis not present

## 2013-01-27 DIAGNOSIS — N390 Urinary tract infection, site not specified: Secondary | ICD-10-CM | POA: Diagnosis not present

## 2013-01-27 LAB — POCT URINALYSIS DIPSTICK
Bilirubin, UA: NEGATIVE
Glucose, UA: NEGATIVE
Spec Grav, UA: 1.01
pH, UA: 7

## 2013-01-27 MED ORDER — CEFUROXIME AXETIL 500 MG PO TABS: 500.0000 mg | ORAL_TABLET | Freq: Two times a day (BID) | ORAL | Status: AC

## 2013-01-27 NOTE — Patient Instructions (Signed)
Drink plenty of fluids.  Make sure to wipe from front to back, and keep the genital area clean. Take antibiotics twice daily for a week.  Let us know if your abdominal pain is worsening, if you have any fevers, vomiting or other side effects of medication.  Plan to recheck urine dip when you come for your next bloodwork
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
## 2013-01-27 NOTE — Telephone Encounter (Signed)
DONE

## 2013-01-29 LAB — URINE CULTURE

## 2013-02-08 ENCOUNTER — Telehealth: Payer: Self-pay | Admitting: Family Medicine

## 2013-02-08 NOTE — Telephone Encounter (Signed)
I'll have to defer the "tremor" to neurology who was helping with treatment for this.  It is ok to add back on the Reglan as she was doing prior.  Do they still have the medication or do they need refills?

## 2013-02-08 NOTE — Telephone Encounter (Signed)
[name] from the [facility] called and said that Dr. Susann Givens took the patient off of Reglan because he thought it was causing her trimmers, since then they took her off the depakote as well. Since, both of these changes she is sick to her stomach, nausea, and not eating as much. She said that since being off these medications she is still having trimmers. She wants to know can they start her back on the Reglan because they have never had these problems before when she was on the medication. CLS

## 2013-02-09 ENCOUNTER — Other Ambulatory Visit: Payer: Self-pay | Admitting: Medical

## 2013-02-09 MED ORDER — METOCLOPRAMIDE HCL 5 MG PO TABS: 5.0000 mg | ORAL_TABLET | Freq: Two times a day (BID) | ORAL | Status: DC

## 2013-02-09 NOTE — Telephone Encounter (Signed)
[name] said that they did not have any more of the medication. She said can you please write out the orders so she can pick them up and I can call the medication into the pharmacy for them. She said they need the written orders to start the medication. She said the Neurology followed her for 2 months and said that she had some related to parkinson's but didn't want to say she had Parkinson's and there was nothing he could do for her. CLS

## 2013-02-09 NOTE — Telephone Encounter (Signed)
Disregard note about pulling chart.  I will start her back on Reglan BID, but regarding tremor, neurology felt like it could possibly be aggravated by Reglan and Depakote.   We are starting back low dose Reglan, but will need to discuss reduced dose of Depakote with Psychiatry

## 2013-02-09 NOTE — Telephone Encounter (Signed)
pls pull chart   FYI - neurology notes

## 2013-02-09 NOTE — Telephone Encounter (Signed)
I called out the medication to the patients pharmacy per Crosby Oyster PA-C. CLS

## 2013-02-11 DIAGNOSIS — S8000XA Contusion of unspecified knee, initial encounter: Secondary | ICD-10-CM | POA: Diagnosis not present

## 2013-02-12 DIAGNOSIS — F329 Major depressive disorder, single episode, unspecified: Secondary | ICD-10-CM | POA: Diagnosis not present

## 2013-02-12 DIAGNOSIS — F29 Unspecified psychosis not due to a substance or known physiological condition: Secondary | ICD-10-CM | POA: Diagnosis not present

## 2013-02-19 ENCOUNTER — Other Ambulatory Visit (INDEPENDENT_AMBULATORY_CARE_PROVIDER_SITE_OTHER): Payer: Medicare Other

## 2013-02-19 ENCOUNTER — Other Ambulatory Visit: Payer: Self-pay | Admitting: Family Medicine

## 2013-02-19 ENCOUNTER — Telehealth: Payer: Self-pay | Admitting: Internal Medicine

## 2013-02-19 DIAGNOSIS — Z79899 Other long term (current) drug therapy: Secondary | ICD-10-CM

## 2013-02-19 DIAGNOSIS — Z7901 Long term (current) use of anticoagulants: Secondary | ICD-10-CM | POA: Diagnosis not present

## 2013-02-19 DIAGNOSIS — N390 Urinary tract infection, site not specified: Secondary | ICD-10-CM

## 2013-02-19 LAB — CBC WITH DIFFERENTIAL/PLATELET
Basophils Absolute: 0 10*3/uL (ref 0.0–0.1)
Eosinophils Absolute: 0.2 10*3/uL (ref 0.0–0.7)
Eosinophils Relative: 3 % (ref 0–5)
Lymphocytes Relative: 13 % (ref 12–46)
Lymphs Abs: 0.8 10*3/uL (ref 0.7–4.0)
MCV: 87.5 fL (ref 78.0–100.0)
Neutrophils Relative %: 74 % (ref 43–77)
Platelets: 118 10*3/uL — ABNORMAL LOW (ref 150–400)
RBC: 3.83 MIL/uL — ABNORMAL LOW (ref 3.87–5.11)
RDW: 12.9 % (ref 11.5–15.5)
WBC: 5.8 10*3/uL (ref 4.0–10.5)

## 2013-02-19 LAB — HEPATIC FUNCTION PANEL
Albumin: 3.8 g/dL (ref 3.5–5.2)
Alkaline Phosphatase: 74 U/L (ref 39–117)
Indirect Bilirubin: 0.2 mg/dL (ref 0.0–0.9)
Total Bilirubin: 0.3 mg/dL (ref 0.3–1.2)
Total Protein: 6.1 g/dL (ref 6.0–8.3)

## 2013-02-19 LAB — POCT URINALYSIS DIPSTICK
Bilirubin, UA: NEGATIVE
Glucose, UA: NEGATIVE
Nitrite, UA: NEGATIVE
Urobilinogen, UA: NEGATIVE

## 2013-02-19 LAB — PROTIME-INR: Prothrombin Time: 34.8 seconds — ABNORMAL HIGH (ref 11.6–15.2)

## 2013-02-19 LAB — AMMONIA: Ammonia: 27 umol/L (ref 16–53)

## 2013-02-19 NOTE — Telephone Encounter (Signed)
Per shane tysinger, pt PT/INR was high and needed me to call and verify dose and tell them to hold off dose until Monday until shane could see what the next step was.. Pt is taking coumadin 11mg   Called deb to let her know the plan and she needed a written order. She stated that her office just moved and the fax was down and asked if i could send it her phone. i took a picture of the order that said hold coumadin until Monday so she would be able to have pt stop until Monday.

## 2013-02-19 NOTE — Telephone Encounter (Signed)
Call about the PT/INR.  Also fax labs to the ordering physician.  See script

## 2013-02-19 NOTE — Addendum Note (Signed)
Addended by: Leretha Dykes L on: 02/19/2013 11:15 AM   Modules accepted: Orders

## 2013-02-19 NOTE — Addendum Note (Signed)
Addended by: Leretha Dykes L on: 02/19/2013 11:10 AM   Modules accepted: Orders

## 2013-02-19 NOTE — Addendum Note (Signed)
Addended by: Janeice Robinson on: 02/19/2013 10:24 AM   Modules accepted: Orders

## 2013-02-20 LAB — VALPROIC ACID LEVEL: Valproic Acid Lvl: 13.2 ug/mL — ABNORMAL LOW (ref 50.0–100.0)

## 2013-02-22 NOTE — Telephone Encounter (Signed)
I fax over the patients lab results [facility]. The caregivers are aware of the change in the medication. CLS

## 2013-03-01 DIAGNOSIS — B351 Tinea unguium: Secondary | ICD-10-CM | POA: Diagnosis not present

## 2013-03-01 DIAGNOSIS — Q828 Other specified congenital malformations of skin: Secondary | ICD-10-CM | POA: Diagnosis not present

## 2013-03-01 DIAGNOSIS — M79609 Pain in unspecified limb: Secondary | ICD-10-CM | POA: Diagnosis not present

## 2013-03-01 DIAGNOSIS — E1149 Type 2 diabetes mellitus with other diabetic neurological complication: Secondary | ICD-10-CM | POA: Diagnosis not present

## 2013-03-08 DIAGNOSIS — F29 Unspecified psychosis not due to a substance or known physiological condition: Secondary | ICD-10-CM | POA: Diagnosis not present

## 2013-03-08 DIAGNOSIS — F329 Major depressive disorder, single episode, unspecified: Secondary | ICD-10-CM | POA: Diagnosis not present

## 2013-03-09 ENCOUNTER — Other Ambulatory Visit: Payer: Self-pay | Admitting: Family Medicine

## 2013-03-09 ENCOUNTER — Other Ambulatory Visit: Payer: Medicare Other

## 2013-03-09 DIAGNOSIS — Z7901 Long term (current) use of anticoagulants: Secondary | ICD-10-CM

## 2013-03-09 DIAGNOSIS — Z79899 Other long term (current) drug therapy: Secondary | ICD-10-CM | POA: Diagnosis not present

## 2013-03-09 LAB — PROTIME-INR: Prothrombin Time: 17.3 seconds — ABNORMAL HIGH (ref 11.6–15.2)

## 2013-03-16 ENCOUNTER — Other Ambulatory Visit: Payer: Self-pay | Admitting: Family Medicine

## 2013-03-17 ENCOUNTER — Telehealth: Payer: Self-pay | Admitting: Medical

## 2013-03-17 NOTE — Telephone Encounter (Signed)
I spoke with [name] about [name] new Coumadin orders and I wrote the orders out again and I left them up front for someone to pick up. CLS

## 2013-03-17 NOTE — Telephone Encounter (Signed)
Do you or Martie Lee have the message.  I sent msg back to one of you guys over weekend to give new dosing to be sent over, but don't see the message.   pls help

## 2013-03-24 ENCOUNTER — Other Ambulatory Visit: Payer: Self-pay | Admitting: Medical

## 2013-04-02 ENCOUNTER — Other Ambulatory Visit: Payer: Self-pay | Admitting: Family Medicine

## 2013-04-16 ENCOUNTER — Other Ambulatory Visit: Payer: Medicare Other

## 2013-04-16 DIAGNOSIS — Z7901 Long term (current) use of anticoagulants: Secondary | ICD-10-CM

## 2013-04-16 DIAGNOSIS — Z79899 Other long term (current) drug therapy: Secondary | ICD-10-CM

## 2013-04-16 LAB — PROTIME-INR: Prothrombin Time: 25.1 seconds — ABNORMAL HIGH (ref 11.6–15.2)

## 2013-04-22 ENCOUNTER — Telehealth: Payer: Self-pay

## 2013-04-22 NOTE — Telephone Encounter (Signed)
LINDSEY FROM [facility] CALL ED BACK AND SAID YES [name] IS TAKING MOBIC 15 MG

## 2013-04-23 ENCOUNTER — Telehealth: Payer: Self-pay | Admitting: Family Medicine

## 2013-04-23 ENCOUNTER — Other Ambulatory Visit: Payer: Self-pay | Admitting: Medical

## 2013-04-23 MED ORDER — HYDROCODONE-ACETAMINOPHEN 5-325 MG PO TABS: 1.0000 | ORAL_TABLET | Freq: Three times a day (TID) | ORAL | Status: DC | PRN

## 2013-04-23 NOTE — Telephone Encounter (Signed)
Wrong patient's chart.

## 2013-04-23 NOTE — Telephone Encounter (Signed)
Disregard prior msg.   She should NOT be on Mobic.   Not sure when this was started, but it is an anti-inflammatory and these interact with coumadin and some of her other medications.

## 2013-04-23 NOTE — Telephone Encounter (Signed)
[name] from the [facility] said that Domingo Cocking has some left over Hydrocodone from her knee surgery. She said she would give her that over the weekend and she would come by on Monday to pick up a Rx for additional pain medication. She is currently taking Hydrocodone 5/325 mg. CLS

## 2013-04-23 NOTE — Telephone Encounter (Signed)
LMOM TO CB. CLS 

## 2013-04-23 NOTE — Telephone Encounter (Signed)
Patients caregiver is aware of Crosby Oyster PA-C recommendations. CLS

## 2013-04-28 DIAGNOSIS — F29 Unspecified psychosis not due to a substance or known physiological condition: Secondary | ICD-10-CM | POA: Diagnosis not present

## 2013-04-29 DIAGNOSIS — M25569 Pain in unspecified knee: Secondary | ICD-10-CM | POA: Diagnosis not present

## 2013-05-24 ENCOUNTER — Telehealth: Payer: Self-pay | Admitting: Medical

## 2013-05-24 MED ORDER — VALSARTAN 80 MG PO TABS: 80.0000 mg | ORAL_TABLET | Freq: Every day | ORAL | Status: DC

## 2013-05-24 NOTE — Telephone Encounter (Signed)
Done

## 2013-05-27 ENCOUNTER — Other Ambulatory Visit: Payer: Medicare Other

## 2013-05-27 ENCOUNTER — Encounter: Payer: Self-pay | Admitting: Internal Medicine

## 2013-05-27 DIAGNOSIS — E119 Type 2 diabetes mellitus without complications: Secondary | ICD-10-CM | POA: Diagnosis not present

## 2013-05-27 DIAGNOSIS — Z961 Presence of intraocular lens: Secondary | ICD-10-CM | POA: Diagnosis not present

## 2013-05-27 DIAGNOSIS — H04129 Dry eye syndrome of unspecified lacrimal gland: Secondary | ICD-10-CM | POA: Diagnosis not present

## 2013-05-31 ENCOUNTER — Other Ambulatory Visit: Payer: Self-pay

## 2013-05-31 ENCOUNTER — Other Ambulatory Visit: Payer: Medicare Other

## 2013-05-31 DIAGNOSIS — Z79899 Other long term (current) drug therapy: Secondary | ICD-10-CM | POA: Diagnosis not present

## 2013-05-31 DIAGNOSIS — Z7901 Long term (current) use of anticoagulants: Secondary | ICD-10-CM

## 2013-05-31 DIAGNOSIS — R5381 Other malaise: Secondary | ICD-10-CM

## 2013-05-31 DIAGNOSIS — D649 Anemia, unspecified: Secondary | ICD-10-CM

## 2013-05-31 LAB — PROTIME-INR: Prothrombin Time: 25 seconds — ABNORMAL HIGH (ref 11.6–15.2)

## 2013-06-07 ENCOUNTER — Other Ambulatory Visit: Payer: Self-pay | Admitting: Family Medicine

## 2013-06-07 ENCOUNTER — Other Ambulatory Visit: Payer: Self-pay

## 2013-06-07 DIAGNOSIS — Z1231 Encounter for screening mammogram for malignant neoplasm of breast: Secondary | ICD-10-CM

## 2013-06-11 ENCOUNTER — Ambulatory Visit (INDEPENDENT_AMBULATORY_CARE_PROVIDER_SITE_OTHER): Payer: Medicare Other | Admitting: Medical

## 2013-06-11 ENCOUNTER — Encounter: Payer: Self-pay | Admitting: Medical

## 2013-06-11 VITALS — BP 122/68 | HR 67 | Temp 98.8°F | Resp 16 | Wt 213.0 lb

## 2013-06-11 DIAGNOSIS — L723 Sebaceous cyst: Secondary | ICD-10-CM | POA: Diagnosis not present

## 2013-06-11 DIAGNOSIS — R3915 Urgency of urination: Secondary | ICD-10-CM | POA: Diagnosis not present

## 2013-06-11 NOTE — Progress Notes (Signed)
Subjective:  [name] is a 78 y.o. female with hx/o UTI, presents with 2 issues.  Caregiver notes some occasional incontinence recently.   Thinks it is more behavior oriented but wants to rule out UTI.  She hasn't been c/o pain or burning.   She tends to hold her urine for long periods during the day at day programs since she doesn't like to use any toilet other than her own.   Denies frequent urination, fever, abdominal or back pain. No urine odor.   She denies incontinence with laughing or coughing.    She has possible cyst of upper right back x possibly a year.  At times it aggravates her.     Past Medical History  Diagnosis Date  . Anemia   . PVD (peripheral vascular disease)   . Hypertension   . Asthma   . Diabetes mellitus   . GERD (gastroesophageal reflux disease)   . COPD (chronic obstructive pulmonary disease)   . Hx of deep venous thrombosis   . Constipation   . Chronic kidney disease   . Thrombocytopenia   . Cardiomyopathy   . Allergy   . Depression   . Mild mental retardation   . Mood disorder     ROS as in subjective  Reviewed allergies, medications, past medical, surgical, and social history.    Objective: Filed Vitals:   06/11/13 1603  BP: 122/68  Pulse: 67  Temp: 98.8 F (37.1 C)  Resp: 16    General appearance: alert, no distress, WD/WN, female Abdomen: +bs, soft, non tender, non distended, no masses, no hepatomegaly, no splenomegaly, no bruits Back: no CVA tenderness Skin: right upper back with 2cm somewhat round dense lesion, pore opening at central top of lesion, c/w sebaceous cyst.  No erythema, fluctuance, warmth.     Assessment: Encounter Diagnoses  Name Primary?  . Urinary urgency Yes  . Sebaceous cyst      Plan: urinalysis unremarkable.  I suspect urinary urge incontinence due to behavior/holding urine too long.  They will work with her on this.  Discussed kegel exercises, practicing this as well.   Recheck prn.  Sebaceous cyst  - discussed options, risks/benefits, and at this time we all agrees to leave the cyst alone. Discussed reasons to recheck on this.

## 2013-06-12 ENCOUNTER — Other Ambulatory Visit: Payer: Self-pay | Admitting: Medical

## 2013-06-14 LAB — POCT URINALYSIS DIPSTICK
Ketones, UA: NEGATIVE
Protein, UA: NEGATIVE
Spec Grav, UA: 1.01
Urobilinogen, UA: NEGATIVE
pH, UA: 5

## 2013-06-15 ENCOUNTER — Other Ambulatory Visit: Payer: Self-pay | Admitting: Medical

## 2013-06-24 DIAGNOSIS — E1149 Type 2 diabetes mellitus with other diabetic neurological complication: Secondary | ICD-10-CM | POA: Diagnosis not present

## 2013-06-24 DIAGNOSIS — B351 Tinea unguium: Secondary | ICD-10-CM | POA: Diagnosis not present

## 2013-06-24 DIAGNOSIS — M79609 Pain in unspecified limb: Secondary | ICD-10-CM | POA: Diagnosis not present

## 2013-06-24 DIAGNOSIS — Q828 Other specified congenital malformations of skin: Secondary | ICD-10-CM | POA: Diagnosis not present

## 2013-07-01 ENCOUNTER — Other Ambulatory Visit: Payer: Self-pay | Admitting: Family Medicine

## 2013-07-05 ENCOUNTER — Other Ambulatory Visit: Payer: Self-pay | Admitting: Medical

## 2013-07-06 ENCOUNTER — Other Ambulatory Visit: Payer: Medicare Other

## 2013-07-06 DIAGNOSIS — Z7901 Long term (current) use of anticoagulants: Secondary | ICD-10-CM

## 2013-07-06 DIAGNOSIS — Z79899 Other long term (current) drug therapy: Secondary | ICD-10-CM | POA: Diagnosis not present

## 2013-07-06 LAB — PROTIME-INR: Prothrombin Time: 30 seconds — ABNORMAL HIGH (ref 11.6–15.2)

## 2013-07-09 ENCOUNTER — Other Ambulatory Visit (INDEPENDENT_AMBULATORY_CARE_PROVIDER_SITE_OTHER): Payer: Medicare Other

## 2013-07-09 ENCOUNTER — Other Ambulatory Visit: Payer: Self-pay | Admitting: Medical

## 2013-07-09 DIAGNOSIS — R350 Frequency of micturition: Secondary | ICD-10-CM | POA: Diagnosis not present

## 2013-07-09 DIAGNOSIS — R82998 Other abnormal findings in urine: Secondary | ICD-10-CM | POA: Diagnosis not present

## 2013-07-09 DIAGNOSIS — R8290 Unspecified abnormal findings in urine: Secondary | ICD-10-CM

## 2013-07-09 LAB — POCT URINALYSIS DIPSTICK
Bilirubin, UA: NEGATIVE
Glucose, UA: NEGATIVE
Nitrite, UA: POSITIVE
Spec Grav, UA: 1.02
Urobilinogen, UA: NEGATIVE

## 2013-07-09 MED ORDER — CEFUROXIME AXETIL 500 MG PO TABS: 500.0000 mg | ORAL_TABLET | Freq: Two times a day (BID) | ORAL | Status: DC

## 2013-07-11 LAB — URINE CULTURE: Colony Count: >=100000

## 2013-07-19 ENCOUNTER — Ambulatory Visit: Payer: Medicare Other

## 2013-07-27 DIAGNOSIS — E119 Type 2 diabetes mellitus without complications: Secondary | ICD-10-CM | POA: Diagnosis not present

## 2013-07-27 DIAGNOSIS — H04129 Dry eye syndrome of unspecified lacrimal gland: Secondary | ICD-10-CM | POA: Diagnosis not present

## 2013-08-02 ENCOUNTER — Telehealth: Payer: Self-pay | Admitting: Family Medicine

## 2013-08-02 NOTE — Telephone Encounter (Signed)
[name] said that she will only participate in bowling. She said it will start on the 15 th. CLS

## 2013-08-02 NOTE — Telephone Encounter (Signed)
I left a message that the forms are ready. CLS

## 2013-08-02 NOTE — Telephone Encounter (Signed)
Ok, return forms 

## 2013-08-02 NOTE — Telephone Encounter (Signed)
Message copied by Janeice Robinson on Mon Aug 02, 2013 10:26 AM ------      Message from: Aleen Campi, [place] S      Created: Fri Jul 30, 2013  8:30 PM       pls check with Reece Levy to see what activities that they will be doing at [employer].  I didn't put a specific restriction, but does she need me to specify?   ------

## 2013-08-03 ENCOUNTER — Ambulatory Visit
Admission: RE | Admit: 2013-08-03 | Discharge: 2013-08-03 | Disposition: A | Discharge status: Home / Self Care | Payer: Medicare Other | Source: Ambulatory Visit

## 2013-08-03 DIAGNOSIS — Z1231 Encounter for screening mammogram for malignant neoplasm of breast: Secondary | ICD-10-CM

## 2013-08-06 ENCOUNTER — Other Ambulatory Visit: Payer: Self-pay | Admitting: Family Medicine

## 2013-08-06 DIAGNOSIS — R928 Other abnormal and inconclusive findings on diagnostic imaging of breast: Secondary | ICD-10-CM

## 2013-08-09 ENCOUNTER — Other Ambulatory Visit: Payer: Self-pay | Admitting: Medical

## 2013-08-09 DIAGNOSIS — M25569 Pain in unspecified knee: Secondary | ICD-10-CM | POA: Diagnosis not present

## 2013-08-13 ENCOUNTER — Telehealth: Payer: Self-pay | Admitting: Medical

## 2013-08-13 MED ORDER — ONDANSETRON HCL 4 MG PO TABS: 4.0000 mg | ORAL_TABLET | Freq: Two times a day (BID) | ORAL | Status: DC

## 2013-08-13 NOTE — Telephone Encounter (Signed)
Her insurer keeps bugging me about the risk of Reglan, and is wanting her to use alternate medication.   Lets try Zofran BID as a safer alternative.

## 2013-08-16 ENCOUNTER — Telehealth: Payer: Self-pay | Admitting: Medical

## 2013-08-16 NOTE — Telephone Encounter (Signed)
[name] called and stated she has concerns about this new medication. She would like for you to call her so you both can discuss this medication. Please call deb at [phone]

## 2013-08-16 NOTE — Telephone Encounter (Signed)
pls find the insurance note we just got about the reglan warning.  i want to call the insurance company.

## 2013-08-16 NOTE — Telephone Encounter (Signed)
[name] is aware of Kristian Covey PA-C message and she has the written message as well. CLS

## 2013-08-16 NOTE — Telephone Encounter (Signed)
Spoke to [employer] about the Reglan issue.  Going back, she was taken off in 2013 due to tremors, thought to be side effect.  But neurology deemed the Reglan was not the cause after she had been off the medication and still had the tremors.  She actually takes Reglan for gastroparesis/slow motility and when off the medication, seems to get more upset stomach/vague abdominal pain.  Thus, we made the decisions today, to stop Reglan for 1-2 wk.   If no symptoms during that time, we will hold off on Reglan.  However, if she develops abdominal discomfort or other motility symptoms, then we will remain on Reglan, and in the future will notify her insurer to stop sending [place] warning notes about Reglan.

## 2013-08-17 ENCOUNTER — Telehealth: Payer: Self-pay | Admitting: Medical

## 2013-08-17 NOTE — Telephone Encounter (Signed)
The paper is on your desk. CLS

## 2013-08-17 NOTE — Telephone Encounter (Signed)
pls call [employer] and ask them to not send me anymore reminders/notices about Reglan/Metoclopramide.  Every time we get these we end up having to go back over her chart, make necessary calls to group home, get every body aggravated about this.  She has been on this,will continue on this, she has been tried on other medications and off this medications, and she needs this medication.  Does well on this for gastroparesis.  So we are not going to stop this, and quit sending [place] warnings/reminders about metoclopramide!

## 2013-08-19 ENCOUNTER — Ambulatory Visit
Admission: RE | Admit: 2013-08-19 | Discharge: 2013-08-19 | Disposition: A | Discharge status: Home / Self Care | Payer: Medicare Other | Source: Ambulatory Visit | Attending: Family Medicine | Admitting: Family Medicine

## 2013-08-19 ENCOUNTER — Other Ambulatory Visit: Payer: Self-pay | Admitting: Medical

## 2013-08-19 DIAGNOSIS — R928 Other abnormal and inconclusive findings on diagnostic imaging of breast: Secondary | ICD-10-CM

## 2013-08-20 ENCOUNTER — Encounter: Payer: Self-pay | Admitting: Family Medicine

## 2013-08-20 NOTE — Telephone Encounter (Signed)
I fax [employer] a letter. CLS

## 2013-08-23 ENCOUNTER — Encounter: Payer: Self-pay | Admitting: Medical

## 2013-08-23 ENCOUNTER — Ambulatory Visit (INDEPENDENT_AMBULATORY_CARE_PROVIDER_SITE_OTHER): Payer: Medicare Other | Admitting: Medical

## 2013-08-23 ENCOUNTER — Telehealth: Payer: Self-pay | Admitting: Medical

## 2013-08-23 VITALS — BP 124/80 | HR 60 | Temp 97.8°F | Resp 16 | Ht 66.0 in | Wt 203.0 lb

## 2013-08-23 DIAGNOSIS — D696 Thrombocytopenia, unspecified: Secondary | ICD-10-CM

## 2013-08-23 DIAGNOSIS — Z7901 Long term (current) use of anticoagulants: Secondary | ICD-10-CM

## 2013-08-23 DIAGNOSIS — N189 Chronic kidney disease, unspecified: Secondary | ICD-10-CM

## 2013-08-23 DIAGNOSIS — F70 Mild intellectual disabilities: Secondary | ICD-10-CM

## 2013-08-23 DIAGNOSIS — I10 Essential (primary) hypertension: Secondary | ICD-10-CM

## 2013-08-23 DIAGNOSIS — E119 Type 2 diabetes mellitus without complications: Secondary | ICD-10-CM | POA: Diagnosis not present

## 2013-08-23 DIAGNOSIS — Z23 Encounter for immunization: Secondary | ICD-10-CM | POA: Diagnosis not present

## 2013-08-23 DIAGNOSIS — Z79899 Other long term (current) drug therapy: Secondary | ICD-10-CM | POA: Diagnosis not present

## 2013-08-23 DIAGNOSIS — M549 Dorsalgia, unspecified: Secondary | ICD-10-CM

## 2013-08-23 DIAGNOSIS — D649 Anemia, unspecified: Secondary | ICD-10-CM

## 2013-08-23 LAB — LIPID PANEL
HDL: 63 mg/dL (ref >39)
LDL Cholesterol: 68 mg/dL (ref 0–99)
Triglycerides: 59 mg/dL (ref <150)

## 2013-08-23 LAB — CBC WITH DIFFERENTIAL/PLATELET
Basophils Absolute: 0 10*3/uL (ref 0.0–0.1)
Basophils Relative: 0 % (ref 0–1)
Hemoglobin: 12 g/dL (ref 12.0–15.0)
Lymphocytes Relative: 17 % (ref 12–46)
MCHC: 34.5 g/dL (ref 30.0–36.0)
Monocytes Relative: 6 % (ref 3–12)
Neutro Abs: 4.9 10*3/uL (ref 1.7–7.7)
Neutrophils Relative %: 75 % (ref 43–77)
RDW: 13.6 % (ref 11.5–15.5)
WBC: 6.5 10*3/uL (ref 4.0–10.5)

## 2013-08-23 LAB — COMPREHENSIVE METABOLIC PANEL
AST: 15 U/L (ref 0–37)
Albumin: 4.3 g/dL (ref 3.5–5.2)
BUN: 25 mg/dL — ABNORMAL HIGH (ref 6–23)
CO2: 29 mEq/L (ref 19–32)
Calcium: 9.1 mg/dL (ref 8.4–10.5)
Chloride: 99 mEq/L (ref 96–112)
Glucose, Bld: 99 mg/dL (ref 70–99)
Potassium: 4.5 mEq/L (ref 3.5–5.3)
Sodium: 135 mEq/L (ref 135–145)
Total Protein: 6.5 g/dL (ref 6.0–8.3)

## 2013-08-23 LAB — HEMOGLOBIN A1C: Hgb A1c MFr Bld: 6.3 % — ABNORMAL HIGH (ref <5.7)

## 2013-08-23 NOTE — Progress Notes (Signed)
Subjective:   HPI  [name] is a 78 y.o. female who presents for a yearly checkup and recheck on chronic issues.  Goes by "[name]."  She is here with caregiver from [facility] where patient resides. Overall been doing well.  Her care team includes the following: Opthalmology - Dr. Janet Berlin Dentist - Dr. Tristan Schroeder Neurology- Dr. Danae Orleans and his PA; saw 2013 for tremor, discharged/released from care Cardiology - Dr. Tenny Craw in the past, but no current routine f/u.  Hematology - Dr. Arline Asp for anemia and thrombocytopenia, discharged/released from care 2013 Podiatry - Dr. Cristie Hem, [facility] Psych[facility] - was [facility]) but getting ready to change to different psychiatrist, non [facility]  She does exercise with chair exercise and walks regularly.  She volunteers at [employer], and volunteers at [employer].    No c/o current medication side effects.  compliant with medications which are overseen and administered by the group home staff.   Diabetes - eats relatively health.   She checks glucose in the mornings and is always 120 or less.  No other c/o.  Preventative care: Last ophthalmology visit:  Earlier this year Last dental visit: last week Last colonoscopy: within last couple years Last mammogram: last month Last gynecological exam:? Last labs: pt/inr last month  Prior vaccinations: TD or Tdap: 2012 Influenza: today  Pneumococcal: 2010 Shingles/Zostavax: inquired last year, but unable to get due to costs  Advanced directive: Health care power of attorney: yes Living will: yes  Concerns: Hearing concern of late.  Thinks she has impacted was as she had a year ago.   [place] the fair onto her buttocks a few weeks ago, still c/o low back pain.  No weakness, numbness, only c/o pain in the mornings.  No bruising, no additional falls.   Reviewed their medical, surgical, family, social, medication, and allergy history and  updated chart as appropriate.   Review of Systems Constitutional: -fever, -chills, -sweats, -unexpected weight change, -decreased appetite, -fatigue Allergy: -sneezing, -itching, -congestion Dermatology: -changing moles, --rash, -lumps ENT: +runny nose, -ear pain, -sore throat, -hoarseness, -sinus pain, -teeth pain, - ringing in ears, -hearing loss, -nosebleeds Cardiology: -chest pain, -palpitations, -swelling, -difficulty breathing when lying flat, -waking up short of breath Respiratory: -cough, -shortness of breath, -difficulty breathing with exercise or exertion, -wheezing, -coughing up blood Gastroenterology: -abdominal pain, -nausea, -vomiting, -diarrhea, -constipation, -blood in stool, -changes in bowel movement, -difficulty swallowing or eating Hematology: -bleeding, -bruising  Musculoskeletal: -joint aches, -muscle aches, -joint swelling, -back pain, -neck pain, -cramping, -changes in gait +knee pain Ophthalmology: denies vision changes, eye redness, itching, discharge Urology: -burning with urination, -difficulty urinating, -blood in urine, -urinary frequency, -urgency, -incontinence Neurology: -headache, -weakness, -tingling, -numbness, -memory loss, -falls, -dizziness Psychology: -depressed mood, -agitation, -sleep problems     Objective:   Physical Exam  BP 124/80  Pulse 60  Temp(Src) 97.8 F (36.6 C) (Oral)  Resp 16  Ht 5\' 6"  (1.676 m)  Wt 203 lb (92.08 kg)  BMI 32.78 kg/m2  General appearance: alert, no distress, WD/WN, white female Skin: few mild small round areas of ecchymosis on right proximal forearm HEENT: normocephalic, conjunctiva/corneas normal, sclerae anicteric, PERRLA, EOMi, left TM with impacted cerumen, right ear canal and TM normal appearing, nares patent, no discharge or erythema, pharynx normal Oral cavity: MMM, tongue normal, edentulous Neck: supple, no lymphadenopathy, no thyromegaly, no masses, normal ROM, no bruits Heart: RRR, normal S1, S2, no  murmurs Lungs: CTA bilaterally, no  wheezes, rhonchi, or rales Abdomen: +bs, RUQ surgical oblique scar, lower central abdomen with vertical surgical scar, soft, non tender, non distended, no masses, no hepatomegaly, no splenomegaly, no bruits Back: she reports some mild pain with flexion, otherwise non tender, normal ROM, no scoliosis Musculoskeletal: nontender, limited exam, no obvious deformity, some reduced hip internal and external ROM generalized Pulses: 2+ symmetric, upper and lower extremities, normal cap refill Neurological: alert, oriented, nonfocal exam Psychiatric: normal affect, behavior normal, pleasant  Breast/gyn/rectal deferred   Assessment :    Encounter Diagnoses  Name Primary?  . Type II or unspecified type diabetes mellitus without mention of complication, not stated as uncontrolled Yes  . Need for prophylactic vaccination and inoculation against influenza   . Essential hypertension, benign   . Thrombocytopenia   . Chronic kidney disease   . Long term (current) use of anticoagulants   . Mild mental retardation   . Anemia   . Back pain   . Encounter for long-term (current) use of other medications      Plan:   Diabetes type II - Hemoglobin A1C today, controlled with diet and exercise.  Hyperlipidemia - labs today. Osteopenia - on Boniva.  Last Dexa 2013. Impacted cerumen of both ears - successfully removed cerumen from left ear with lavage.   Anticoagulant use - labs today, taking Coumadin 10mg  daily. Thrombocytopenia/anemia - stable, released from hematology care 2014. Hypertension - controlled on current medication Reviewed [facility] notes from 06/25/10.  Pap, pelvic, breast screening at that time normal.   Discussed frequency of examination of breast/pelvic with group home representative.  She is up to date on breast mammogram.  back pain, fall - will go for xray. chronic kidney disease - labs today Anemia - labs today Mild MR - followed by  psychiatry

## 2013-08-24 ENCOUNTER — Encounter: Payer: Self-pay | Admitting: Medical

## 2013-08-24 LAB — VALPROIC ACID LEVEL: Valproic Acid Lvl: 25.7 ug/mL — ABNORMAL LOW (ref 50.0–100.0)

## 2013-08-24 LAB — PROTIME-INR
INR: 1.97 — ABNORMAL HIGH (ref <1.50)
Prothrombin Time: 21.8 seconds — ABNORMAL HIGH (ref 11.6–15.2)

## 2013-08-24 NOTE — Telephone Encounter (Signed)
lm

## 2013-08-25 ENCOUNTER — Telehealth: Payer: Self-pay | Admitting: Family Medicine

## 2013-08-25 ENCOUNTER — Ambulatory Visit
Admission: RE | Admit: 2013-08-25 | Discharge: 2013-08-25 | Disposition: A | Discharge status: Home / Self Care | Payer: Medicare Other | Source: Ambulatory Visit | Attending: Medical | Admitting: Medical

## 2013-08-25 DIAGNOSIS — M4310 Spondylolisthesis, site unspecified: Secondary | ICD-10-CM | POA: Diagnosis not present

## 2013-08-25 DIAGNOSIS — M5137 Other intervertebral disc degeneration, lumbosacral region: Secondary | ICD-10-CM | POA: Diagnosis not present

## 2013-08-25 DIAGNOSIS — M549 Dorsalgia, unspecified: Secondary | ICD-10-CM

## 2013-08-25 DIAGNOSIS — M169 Osteoarthritis of hip, unspecified: Secondary | ICD-10-CM | POA: Diagnosis not present

## 2013-08-25 NOTE — Telephone Encounter (Signed)
Truly had flu shot Monday morning, arm is warm to touch, red   She is concerned and wanted to check with someone

## 2013-08-25 NOTE — Telephone Encounter (Signed)
TALKED WITH ONE OF THE LADYS TOLD THEM TWO TYLENOL EVERY 6 HRS AS NEEDED

## 2013-08-25 NOTE — Telephone Encounter (Signed)
Let her know that this is not unusual and treat this with Tylenol

## 2013-09-06 ENCOUNTER — Other Ambulatory Visit: Payer: Self-pay | Admitting: Family Medicine

## 2013-09-06 ENCOUNTER — Telehealth: Payer: Self-pay | Admitting: Medical

## 2013-09-06 MED ORDER — METOCLOPRAMIDE HCL 5 MG PO TABS: 5.0000 mg | ORAL_TABLET | Freq: Two times a day (BID) | ORAL | Status: DC

## 2013-09-06 NOTE — Telephone Encounter (Signed)
Pt's social worker called and said pt is complaining of having stomach pain everyday since she has been off of medication Metoclopramide.  She states the same effect happened to her the last time she was taken off of this medication, and would like any suggestions or if she could be put back on it.

## 2013-09-06 NOTE — Telephone Encounter (Signed)
Patients caregiver is aware and I also sent the refills to her pharmacy per there request. CLS

## 2013-09-06 NOTE — Telephone Encounter (Signed)
Have her restart Reglan as she was doing prior.  We have also sent [employer] a notification to stop sending [place] reminders regarding the Reglan.  She is on this for gastroparesis, has done fine on this, and we will not keep rehashing this issue.

## 2013-09-13 ENCOUNTER — Telehealth: Payer: Self-pay | Admitting: Medical

## 2013-09-13 ENCOUNTER — Other Ambulatory Visit: Payer: Self-pay | Admitting: Family Medicine

## 2013-09-13 NOTE — Telephone Encounter (Signed)
I thought we already recommended she stay on this indefinately?  - per our last conversation, or per last call back from them.      So at this point, have her resume and just plan to stay on the Reglan she has been on

## 2013-09-14 NOTE — Telephone Encounter (Signed)
Called [name]. She has started pt back on Reglan and will call back if pt states that stomach pains are getting worse or not going away.

## 2013-09-30 ENCOUNTER — Encounter: Payer: Self-pay | Admitting: Podiatry

## 2013-09-30 ENCOUNTER — Telehealth: Payer: Self-pay | Admitting: Medical

## 2013-09-30 ENCOUNTER — Ambulatory Visit (INDEPENDENT_AMBULATORY_CARE_PROVIDER_SITE_OTHER): Payer: Medicare Other | Admitting: Podiatry

## 2013-09-30 DIAGNOSIS — Q828 Other specified congenital malformations of skin: Secondary | ICD-10-CM

## 2013-09-30 DIAGNOSIS — B351 Tinea unguium: Secondary | ICD-10-CM

## 2013-09-30 DIAGNOSIS — E1159 Type 2 diabetes mellitus with other circulatory complications: Secondary | ICD-10-CM | POA: Diagnosis not present

## 2013-09-30 DIAGNOSIS — M79609 Pain in unspecified limb: Secondary | ICD-10-CM | POA: Diagnosis not present

## 2013-09-30 NOTE — Patient Instructions (Signed)
Diabetes and Foot Care Diabetes may cause you to have problems because of poor blood supply (circulation) to your feet and legs. This may cause the skin on your feet to become thinner, break easier, and heal more slowly. Your skin may become dry, and the skin may peel and crack. You may also have nerve damage in your legs and feet causing decreased feeling in them. You may not notice minor injuries to your feet that could lead to infections or more serious problems. Taking care of your feet is one of the most important things you can do for yourself.  HOME CARE INSTRUCTIONS  Wear shoes at all times, even in the house. Do not go barefoot. Bare feet are easily injured.  Check your feet daily for blisters, cuts, and redness. If you cannot see the bottom of your feet, use a mirror or ask someone for help.  Wash your feet with warm water (do not use hot water) and mild soap. Then pat your feet and the areas between your toes until they are completely dry. Do not soak your feet as this can dry your skin.  Apply a moisturizing lotion or petroleum jelly (that does not contain alcohol and is unscented) to the skin on your feet and to dry, brittle toenails. Do not apply lotion between your toes.  Trim your toenails straight across. Do not dig under them or around the cuticle. File the edges of your nails with an emery board or nail file.  Do not cut corns or calluses or try to remove them with medicine.  Wear clean socks or stockings every day. Make sure they are not too tight. Do not wear knee-high stockings since they may decrease blood flow to your legs.  Wear shoes that fit properly and have enough cushioning. To break in new shoes, wear them for just a few hours a day. This prevents you from injuring your feet. Always look in your shoes before you put them on to be sure there are no objects inside.  Do not cross your legs. This may decrease the blood flow to your feet.  If you find a minor scrape,  cut, or break in the skin on your feet, keep it and the skin around it clean and dry. These areas may be cleansed with mild soap and water. Do not cleanse the area with peroxide, alcohol, or iodine.  When you remove an adhesive bandage, be sure not to damage the skin around it.  If you have a wound, look at it several times a day to make sure it is healing.  Do not use heating pads or hot water bottles. They may burn your skin. If you have lost feeling in your feet or legs, you may not know it is happening until it is too late.  Make sure your health care provider performs a complete foot exam at least annually or more often if you have foot problems. Report any cuts, sores, or bruises to your health care provider immediately. SEEK MEDICAL CARE IF:   You have an injury that is not healing.  You have cuts or breaks in the skin.  You have an ingrown nail.  You notice redness on your legs or feet.  You feel burning or tingling in your legs or feet.  You have pain or cramps in your legs and feet.  Your legs or feet are numb.  Your feet always feel cold. SEEK IMMEDIATE MEDICAL CARE IF:   There is increasing redness,   swelling, or pain in or around a wound.  There is a red line that goes up your leg.  Pus is coming from a wound.  You develop a fever or as directed by your health care provider.  You notice a bad smell coming from an ulcer or wound. Document Released: 10/11/2000 Document Revised: 06/16/2013 Document Reviewed: 03/23/2013 Exit[employer] Patient Information 2014 [employer].  

## 2013-09-30 NOTE — Progress Notes (Signed)
Subjective:     Patient ID: [name], female   DOB: 02-Jun-1935, 78 y.o.   MRN: [id number]  HPI patient is found to have severe nail disease with thickness and pain 1-5 both feet and keratotic lesion right plantar first metatarsal and left big toe that are painful when pressed and make shoe gear difficult. Patient cannot take care of these herself and is a diabetic with risk factors   Review of Systems     Objective:   Physical Exam Diminishment of neurovascular status noted with diminishment of hair growth vein distention within the ankle area both feet and coolness to the feet. Nail disease with thickness and dystrophic changes 1-5 both feet that are painful and I noted there to be keratotic lesions that are painful on both feet    Assessment:     And wrist diabetic with porokeratotic type painful lesions both feet and nail disease it's painful 1-5 both feet    Plan:     Diabetic education rendered and debridement of nailbeds 1-5 both feet and lesions on both feet occurred with no iatrogenic bleeding noted

## 2013-10-01 ENCOUNTER — Other Ambulatory Visit: Payer: Medicare Other

## 2013-10-01 ENCOUNTER — Other Ambulatory Visit: Payer: Self-pay | Admitting: Medical

## 2013-10-01 DIAGNOSIS — Z7901 Long term (current) use of anticoagulants: Secondary | ICD-10-CM

## 2013-10-01 DIAGNOSIS — Z79899 Other long term (current) drug therapy: Secondary | ICD-10-CM

## 2013-10-01 LAB — PROTIME-INR
INR: 3.24 — ABNORMAL HIGH (ref <1.50)
Prothrombin Time: 31.6 seconds — ABNORMAL HIGH (ref 11.6–15.2)

## 2013-10-01 MED ORDER — HYDROCODONE-ACETAMINOPHEN 5-325 MG PO TABS: 1.0000 | ORAL_TABLET | Freq: Three times a day (TID) | ORAL | Status: DC | PRN

## 2013-10-04 ENCOUNTER — Telehealth: Payer: Self-pay | Admitting: Medical

## 2013-10-04 NOTE — Telephone Encounter (Signed)
tsd  °

## 2013-10-04 NOTE — Telephone Encounter (Signed)
See this

## 2013-10-05 NOTE — Telephone Encounter (Signed)
Form was done and pick up. CLS

## 2013-10-19 ENCOUNTER — Other Ambulatory Visit: Payer: Self-pay | Admitting: Family Medicine

## 2013-10-25 ENCOUNTER — Encounter: Payer: Self-pay | Admitting: Medical

## 2013-10-25 ENCOUNTER — Ambulatory Visit (INDEPENDENT_AMBULATORY_CARE_PROVIDER_SITE_OTHER): Payer: Medicare Other | Admitting: Medical

## 2013-10-25 VITALS — BP 130/70 | HR 72 | Temp 98.2°F | Resp 20 | Wt 213.0 lb

## 2013-10-25 DIAGNOSIS — J209 Acute bronchitis, unspecified: Secondary | ICD-10-CM | POA: Diagnosis not present

## 2013-10-25 DIAGNOSIS — Z7901 Long term (current) use of anticoagulants: Secondary | ICD-10-CM | POA: Diagnosis not present

## 2013-10-25 LAB — PROTIME-INR: INR: 2.29 — ABNORMAL HIGH (ref <1.50)

## 2013-10-25 MED ORDER — AZITHROMYCIN 250 MG PO TABS: ORAL_TABLET | ORAL | Status: DC

## 2013-10-25 NOTE — Patient Instructions (Signed)
Bronchitis   Begin Azithromycin 2 tablets today, then 1 tablet days 2-4 through Friday  Have her use the Albuterol inhaler, 2 puffs 3 times daily through Wednesday, then 2 puffs every 6 hours as needed.  Rest, drink plenty of water  Can still use Tussin as needed  If not improving by Wednesday then call back

## 2013-10-25 NOTE — Progress Notes (Signed)
Subjective:  [name] is a 78 y.o. female who presents for cough. She started out with a cold at the beginning of the month, but she now has lingering, worsening cough for the last week.  She notes chest congestion, cough, some wheezing and shortness of breath, but no nausea, vomiting, fever, sinus pressure, ear pain or sore throat. There has been several sick contacts.  The caregiver notes that she is using her inhaler once daily, feels like she needs it more frequently than that.  She worries that the other caregivers are hesitant to use the albuterol more although she has when necessary orders.   No other aggravating or relieving factors.  She is using over-the-counter Tussin and with not much relief. However she does get good relief with the albuterol inhaler. Her last PT/INR was 3 weeks ago, and at that time I changed her to 10 mg daily except for Saturdays.  The following portions of the patient's history were reviewed and updated as appropriate: allergies, current medications, past family history, past medical history, past social history, past surgical history and problem list.  ROS as in subjective  Past Medical History  Diagnosis Date  . Anemia   . PVD (peripheral vascular disease)   . Hypertension   . Asthma   . Diabetes mellitus   . GERD (gastroesophageal reflux disease)   . COPD (chronic obstructive pulmonary disease)   . Hx of deep venous thrombosis   . Constipation   . Chronic kidney disease   . Thrombocytopenia   . Cardiomyopathy   . Allergy   . Depression   . Mild mental retardation   . Mood disorder      Objective: BP 130/70  Pulse 72  Temp(Src) 98.2 F (36.8 C) (Oral)  Resp 20  Wt 213 lb (96.616 kg)  SpO2 96%   General appearance: Alert, WD/WN, no distress, somewhat ill appearing                             Skin: warm, no rash, no diaphoresis                           Head: no sinus tenderness                            Eyes: conjunctiva normal,  corneas clear, PERRLA                            Ears: pearly TMs, external ear canals normal                          Nose: septum midline, turbinates normal             Mouth/throat: MMM, tongue normal, mild pharyngeal erythema                           Neck: supple, no adenopathy, no thyromegaly, nontender                          Heart: RRR, normal S1, S2, no murmurs                         Lungs: Decreased breath sounds,  few scattered wheezes, few scattered crackles, no rhonchi                Extremities: no edema, nontender     Assessment: Encounter Diagnoses  Name Primary?  . Acute bronchitis Yes  . Long term (current) use of anticoagulants     Plan:  Medication orders today include: begin Zpak, increase albuterol inhaler to twice a day for the next several days and can certainly use every 6 hours as needed. Hydrate well, rest.  Call/return in 2-3 days if symptoms are worse or not improving.  We will call later today with PT/INR results.

## 2013-10-26 ENCOUNTER — Other Ambulatory Visit: Payer: Self-pay | Admitting: Medical

## 2013-10-26 ENCOUNTER — Other Ambulatory Visit: Payer: Self-pay | Admitting: Family Medicine

## 2013-10-27 ENCOUNTER — Telehealth: Payer: Self-pay | Admitting: Medical

## 2013-10-27 ENCOUNTER — Other Ambulatory Visit: Payer: Self-pay | Admitting: Medical

## 2013-10-27 ENCOUNTER — Telehealth: Payer: Self-pay | Admitting: Family Medicine

## 2013-10-27 ENCOUNTER — Ambulatory Visit
Admission: RE | Admit: 2013-10-27 | Discharge: 2013-10-27 | Disposition: A | Discharge status: Home / Self Care | Payer: Medicare Other | Source: Ambulatory Visit | Attending: Medical | Admitting: Medical

## 2013-10-27 ENCOUNTER — Encounter: Payer: Self-pay | Admitting: Medical

## 2013-10-27 ENCOUNTER — Ambulatory Visit (INDEPENDENT_AMBULATORY_CARE_PROVIDER_SITE_OTHER): Payer: Medicare Other | Admitting: Medical

## 2013-10-27 VITALS — BP 110/60 | HR 66 | Temp 98.0°F | Resp 18

## 2013-10-27 DIAGNOSIS — R0602 Shortness of breath: Secondary | ICD-10-CM

## 2013-10-27 DIAGNOSIS — R05 Cough: Secondary | ICD-10-CM

## 2013-10-27 DIAGNOSIS — J984 Other disorders of lung: Secondary | ICD-10-CM | POA: Diagnosis not present

## 2013-10-27 DIAGNOSIS — J209 Acute bronchitis, unspecified: Secondary | ICD-10-CM | POA: Diagnosis not present

## 2013-10-27 DIAGNOSIS — R062 Wheezing: Secondary | ICD-10-CM

## 2013-10-27 MED ORDER — ALBUTEROL SULFATE (2.5 MG/3ML) 0.083% IN NEBU: 2.5000 mg | INHALATION_SOLUTION | Freq: Four times a day (QID) | RESPIRATORY_TRACT | Status: DC | PRN

## 2013-10-27 MED ORDER — METHYLPREDNISOLONE ACETATE 40 MG/ML IJ SUSP
60.0000 mL | Freq: Once | INTRAMUSCULAR | Status: AC
  Administered 2013-10-27: 60 mL via INTRA_ARTICULAR

## 2013-10-27 MED ORDER — LEVALBUTEROL HCL 1.25 MG/3ML IN NEBU: 1.2500 mg | INHALATION_SOLUTION | RESPIRATORY_TRACT | Status: DC | PRN

## 2013-10-27 NOTE — Progress Notes (Signed)
Subjective:  [name] is a 78 y.o. female who presents for recheck on bronchitis.   I saw her 2 days ago.  The group home called this morning and said she was no better or worse, about the same, wanted to make sure things weren't worsening before the holiday as we would be closed.  She did go for chest x-ray prior to coming in today.  She started out with a cold at the beginning of the month, but she now has lingering, worsening cough for the last week.  She notes chest congestion, cough, some wheezing and shortness of breath, but no nausea, vomiting, fever, sinus pressure, ear pain or sore throat. There has been several sick contacts.  She is using over-the-counter Tussin and with not much relief.  No other aggravating or relieving factors.    The following portions of the patient's history were reviewed and updated as appropriate: allergies, current medications, past family history, past medical history, past social history, past surgical history and problem list.  ROS as in subjective    Objective: BP 110/60  Pulse 66  Temp(Src) 98 F (36.7 C) (Oral)  Resp 18  SpO2 97%   General appearance: Alert, WD/WN, no distress, somewhat ill appearing                             Skin: warm, no rash, no diaphoresis                           Head: no sinus tenderness                            Eyes: conjunctiva normal, corneas clear, PERRLA                            Ears: pearly TMs, external ear canals normal                          Nose: septum midline, turbinates normal             Mouth/throat: MMM, tongue normal, mild pharyngeal erythema                           Neck: supple, no adenopathy, no thyromegaly, nontender                          Heart: RRR, normal S1, S2, no murmurs                         Lungs: Decreased breath sounds, few scattered wheezes, few scattered crackles, no rhonchi                Extremities: no edema, nontender    10/27/13 CXR:  CLINICAL DATA: Cough.  Shortness of breath.  EXAM:  CHEST 2 VIEW  COMPARISON: Two-view chest x-ray 03/26/2012  FINDINGS:  The heart is upper limits of normal for size. Chronic scarring is  again noted at the left base. Prominence of the central hila is  stable. There is chronic blunting of the left CP angle. No other  airspace disease is evident. Atherosclerotic calcifications are  again noted in the aortic arch. And and and The visualized soft  tissues and bony thorax are unremarkable.  IMPRESSION:  1. Stable pleural and parenchymal scarring at the left base.  2. No acute cardiopulmonary disease.   Assessment: Encounter Diagnoses  Name Primary?  . Acute bronchitis Yes  . Wheezing     Plan:  Her ability to use the [facility] inhaler is limited.  Thus instructions given today include finishing the Z-Pak antibiotic, we gave a shot of 60 mg of Depo-Medrol IM today, we'll have her begin nebulized albuterol instead of the hand-held HFA albuterol, rest, hydrate well, limit sweets and big portions as her sugar will elevate with the shot of steroid.  Call or return if not improved within the next 3-4 days.

## 2013-10-27 NOTE — Telephone Encounter (Signed)
Per Vincenza Hews, pt needs to go to [facility] for CXR & then come to office to be seen.  Vista Deck informed

## 2013-10-27 NOTE — Patient Instructions (Signed)
Begin liquid nebulized albuterol with the "pipe" or "mask" 3 times daily through Friday, then back to as needed every 4-6 hours once she is much improved.   This will be easier than the albuterol puffer that she has now.   We gave a shot of 60mg  Dep Medrol steroid today to calm down inflammation and wheezing.  Check glucose daily fasting in the morning as usual, but expect higher than usual numbers for the next week due to the steroid shot.  As long as her glucose is not running over 300 the next few days, then this will be fine.  If you are seeing numbers well into 300 or higher fasting, then let us know.  For the next week, be extra careful with her diet, restrict sweets, sugary drinks, and big portions.  Drink plenty of water daily.    Call if not improving.

## 2013-10-27 NOTE — Telephone Encounter (Signed)
[place] with [facility] called and states Albuterol needs prior auth, but they were able to afford $8.00 and got it for her.  Also the Xopenex not covered.

## 2013-10-28 DIAGNOSIS — I421 Obstructive hypertrophic cardiomyopathy: Secondary | ICD-10-CM | POA: Diagnosis present

## 2013-10-28 DIAGNOSIS — R251 Tremor, unspecified: Secondary | ICD-10-CM

## 2013-10-28 HISTORY — DX: Obstructive hypertrophic cardiomyopathy: I42.1

## 2013-10-28 HISTORY — DX: Tremor, unspecified: R25.1

## 2013-10-29 NOTE — Telephone Encounter (Signed)
I wanted her to do albuterol nebs BID the next few days in general, but can use every 4-6 hours prn

## 2013-10-29 NOTE — Telephone Encounter (Signed)
[name] from [facility] called stating that pt got an inhaler at 5:45 this morning and deb needs to know if she can do a nebulizer now or does she need to wait until 2pm to do the nebulizer

## 2013-10-29 NOTE — Telephone Encounter (Signed)
Patients caregiver is aware of Kristian Covey PA-C message. CLS

## 2013-11-01 ENCOUNTER — Other Ambulatory Visit: Payer: Self-pay | Admitting: Family Medicine

## 2013-11-01 ENCOUNTER — Other Ambulatory Visit: Payer: Self-pay | Admitting: Medical

## 2013-11-02 ENCOUNTER — Telehealth: Payer: Self-pay | Admitting: Medical

## 2013-11-09 DIAGNOSIS — E119 Type 2 diabetes mellitus without complications: Secondary | ICD-10-CM | POA: Diagnosis not present

## 2013-11-09 DIAGNOSIS — M25569 Pain in unspecified knee: Secondary | ICD-10-CM | POA: Diagnosis not present

## 2013-11-17 ENCOUNTER — Other Ambulatory Visit: Payer: Self-pay | Admitting: Family Medicine

## 2013-11-22 ENCOUNTER — Telehealth: Payer: Self-pay | Admitting: Family Medicine

## 2013-11-22 NOTE — Telephone Encounter (Signed)
Patients care giver is aware and orders are written. CLS

## 2013-11-22 NOTE — Telephone Encounter (Signed)
Message copied by Janeice Robinson on Mon Nov 22, 2013  3:55 PM ------      Message from: Jac Canavan      Created: Fri Nov 19, 2013  7:51 AM       I just signed her refill on lancets.              After reviewing her chart, since her HgbA1Cs have been stablet, they can just check her sugar twice weekly if they are comfortable with that.               I don't think they need to check every single day as she has been stable.            If agreeable, write the order change to check glucose every Monday and Friday, and as needed if she doesn't feel good, shaky or weak.   ------

## 2013-11-24 ENCOUNTER — Telehealth: Payer: Self-pay | Admitting: Medical

## 2013-11-24 ENCOUNTER — Other Ambulatory Visit: Payer: Medicare Other

## 2013-11-24 DIAGNOSIS — F329 Major depressive disorder, single episode, unspecified: Secondary | ICD-10-CM | POA: Diagnosis not present

## 2013-11-24 DIAGNOSIS — Z79899 Other long term (current) drug therapy: Secondary | ICD-10-CM | POA: Diagnosis not present

## 2013-11-24 DIAGNOSIS — F988 Other specified behavioral and emotional disorders with onset usually occurring in childhood and adolescence: Secondary | ICD-10-CM | POA: Diagnosis not present

## 2013-11-24 DIAGNOSIS — Z7901 Long term (current) use of anticoagulants: Secondary | ICD-10-CM | POA: Diagnosis not present

## 2013-11-24 DIAGNOSIS — F3289 Other specified depressive episodes: Secondary | ICD-10-CM | POA: Diagnosis not present

## 2013-11-24 LAB — PROTIME-INR
INR: 2.09 — AB (ref <1.50)
PROTHROMBIN TIME: 22.6 s — AB (ref 11.6–15.2)

## 2013-11-24 NOTE — Telephone Encounter (Signed)
They want to get shingles vaccine for [name], told them best to get at pharmacy so they can file her Part D Needs Rx to take to pharmacy

## 2013-11-25 DIAGNOSIS — M1710 Unilateral primary osteoarthritis, unspecified knee: Secondary | ICD-10-CM | POA: Diagnosis not present

## 2013-11-25 DIAGNOSIS — M9420 Chondromalacia, unspecified site: Secondary | ICD-10-CM | POA: Diagnosis not present

## 2013-11-25 NOTE — Telephone Encounter (Signed)
Script ready on back table of charts

## 2013-11-25 NOTE — Telephone Encounter (Signed)
Called [name] to inform that script ready for pick up

## 2013-11-30 ENCOUNTER — Telehealth: Payer: Self-pay | Admitting: Family Medicine

## 2013-11-30 NOTE — Telephone Encounter (Signed)
[name] called from the [facility] and said they made a mistake on miss Quates Coumadin dose on Saturday. Instead of giving her 5 mg they gave her 15 mg of Coumadin. She called [place] wanting to know what they should do. I spoke with Kristian Covey PA-C and Vincenza Hews said to let them know they should stop the coumadin for 3 days and on the 4 th day she can re-start her Coumadin medication. I spoke back with [name] making her aware of the change and I also wrote the orders out for her and she states she will come by our office to the orders up. CLS

## 2013-12-01 ENCOUNTER — Other Ambulatory Visit: Payer: Self-pay | Admitting: Family Medicine

## 2013-12-01 MED ORDER — WARFARIN SODIUM 5 MG PO TABS: ORAL_TABLET | ORAL | Status: DC

## 2013-12-01 NOTE — Telephone Encounter (Signed)
I assume you verified that she was not having headache, bleeding, other new symptoms?

## 2013-12-01 NOTE — Telephone Encounter (Signed)
I did verify with [name] that she is not having any headaches or bleeding. [name] said that she was doing just fine. CLS

## 2013-12-02 ENCOUNTER — Other Ambulatory Visit: Payer: Self-pay | Admitting: Family Medicine

## 2013-12-02 DIAGNOSIS — M1710 Unilateral primary osteoarthritis, unspecified knee: Secondary | ICD-10-CM | POA: Diagnosis not present

## 2013-12-09 ENCOUNTER — Ambulatory Visit: Payer: Medicare Other | Admitting: Podiatry

## 2013-12-16 ENCOUNTER — Ambulatory Visit (INDEPENDENT_AMBULATORY_CARE_PROVIDER_SITE_OTHER): Payer: Medicare Other | Admitting: Podiatry

## 2013-12-16 ENCOUNTER — Telehealth: Payer: Self-pay | Admitting: Medical

## 2013-12-16 DIAGNOSIS — B351 Tinea unguium: Secondary | ICD-10-CM

## 2013-12-16 DIAGNOSIS — E1159 Type 2 diabetes mellitus with other circulatory complications: Secondary | ICD-10-CM

## 2013-12-16 DIAGNOSIS — M79609 Pain in unspecified limb: Secondary | ICD-10-CM

## 2013-12-16 DIAGNOSIS — Q828 Other specified congenital malformations of skin: Secondary | ICD-10-CM | POA: Diagnosis not present

## 2013-12-16 DIAGNOSIS — M1710 Unilateral primary osteoarthritis, unspecified knee: Secondary | ICD-10-CM | POA: Diagnosis not present

## 2013-12-16 NOTE — Patient Instructions (Signed)
Diabetes and Foot Care Diabetes may cause you to have problems because of poor blood supply (circulation) to your feet and legs. This may cause the skin on your feet to become thinner, break easier, and heal more slowly. Your skin may become dry, and the skin may peel and crack. You may also have nerve damage in your legs and feet causing decreased feeling in them. You may not notice minor injuries to your feet that could lead to infections or more serious problems. Taking care of your feet is one of the most important things you can do for yourself.  HOME CARE INSTRUCTIONS  Wear shoes at all times, even in the house. Do not go barefoot. Bare feet are easily injured.  Check your feet daily for blisters, cuts, and redness. If you cannot see the bottom of your feet, use a mirror or ask someone for help.  Wash your feet with warm water (do not use hot water) and mild soap. Then pat your feet and the areas between your toes until they are completely dry. Do not soak your feet as this can dry your skin.  Apply a moisturizing lotion or petroleum jelly (that does not contain alcohol and is unscented) to the skin on your feet and to dry, brittle toenails. Do not apply lotion between your toes.  Trim your toenails straight across. Do not dig under them or around the cuticle. File the edges of your nails with an emery board or nail file.  Do not cut corns or calluses or try to remove them with medicine.  Wear clean socks or stockings every day. Make sure they are not too tight. Do not wear knee-high stockings since they may decrease blood flow to your legs.  Wear shoes that fit properly and have enough cushioning. To break in new shoes, wear them for just a few hours a day. This prevents you from injuring your feet. Always look in your shoes before you put them on to be sure there are no objects inside.  Do not cross your legs. This may decrease the blood flow to your feet.  If you find a minor scrape,  cut, or break in the skin on your feet, keep it and the skin around it clean and dry. These areas may be cleansed with mild soap and water. Do not cleanse the area with peroxide, alcohol, or iodine.  When you remove an adhesive bandage, be sure not to damage the skin around it.  If you have a wound, look at it several times a day to make sure it is healing.  Do not use heating pads or hot water bottles. They may burn your skin. If you have lost feeling in your feet or legs, you may not know it is happening until it is too late.  Make sure your health care provider performs a complete foot exam at least annually or more often if you have foot problems. Report any cuts, sores, or bruises to your health care provider immediately. SEEK MEDICAL CARE IF:   You have an injury that is not healing.  You have cuts or breaks in the skin.  You have an ingrown nail.  You notice redness on your legs or feet.  You feel burning or tingling in your legs or feet.  You have pain or cramps in your legs and feet.  Your legs or feet are numb.  Your feet always feel cold. SEEK IMMEDIATE MEDICAL CARE IF:   There is increasing redness,   swelling, or pain in or around a wound.  There is a red line that goes up your leg.  Pus is coming from a wound.  You develop a fever or as directed by your health care provider.  You notice a bad smell coming from an ulcer or wound. Document Released: 10/11/2000 Document Revised: 06/16/2013 Document Reviewed: 03/23/2013 Exit[employer] Patient Information 2014 [employer].  

## 2013-12-16 NOTE — Progress Notes (Signed)
Subjective:     Patient ID: [name], female   DOB: June 24, 1935, 79 y.o.   MRN: [id number]  HPI patient presents with painful thick nailbeds 1-5 both feet and keratotic lesion first metatarsal both feet there and possible for either her or caregiver to trim or take care of. She is intact wrist diabetic with long-term history of diabetes   Review of Systems     Objective:   Physical Exam Neurovascular status diminished and diminishment of hair growth on the feet mild coldness to the digits and varicosities in the ankle region both feet. Patient has thick nailbeds 1-5 both feet and keratotic lesion sub-1 metatarsal of both feet    Assessment:     At risk diabetic with mycotic nail infection and lesions of the first metatarsal both feet    Plan:     Debrided nailbeds 1-5 bilateral and debridement of lesions 1 of both feet consistent with porokeratosis with no iatrogenic bleeding noted

## 2013-12-16 NOTE — Telephone Encounter (Signed)
Please let her know if pt should be seen or if any concerns about this

## 2013-12-16 NOTE — Telephone Encounter (Signed)
Discussed P.A. Albuterol, ins wanting info on who is paying for skilled nursing facility etc.  Per [name], they paid out of pocket for med & it wasn't much, states she has plenty left and doesn't use on regular basis,  So no need for P.A.

## 2013-12-17 NOTE — Telephone Encounter (Signed)
I spoke with [name] and I explain to her [facility] message and she states that sometimes Ms. Erk likes to do things to make trips to come to the doctor she would like to know can this wait until Monday. I ask david Tysinger PAC and He states thatt this doesn;'t sound urgent and it would be just find. I told [name] his response. CLS

## 2013-12-17 NOTE — Telephone Encounter (Signed)
In panties, could be vaginal, urinary or rectal.   Is she having any UTI symptoms?  If no symptoms other than the blood, someone should examine her.  If vaginal is suspected, or if obvious, then we may want to get ultrasound first, then f/u with gynecology likely.

## 2013-12-21 ENCOUNTER — Other Ambulatory Visit: Payer: Self-pay | Admitting: Family Medicine

## 2013-12-22 ENCOUNTER — Other Ambulatory Visit: Payer: Self-pay | Admitting: Medical

## 2014-01-03 ENCOUNTER — Telehealth: Payer: Self-pay | Admitting: Medical

## 2014-01-03 NOTE — Telephone Encounter (Signed)
Marijean Bravo with [facility] called and stated that the entire house is sick with a stomach bug. She states that [name] is having diarrhea and throwing up. She states she would like to give her Imodium but can't without orders. She is requesting that you give her orders to be able to give her Imodium. You can reach her at [phone].

## 2014-01-03 NOTE — Telephone Encounter (Signed)
Advise [name] of [name]'s instructions. She will give pt Pepto Bismol and she does not need order to give pt this med

## 2014-01-03 NOTE — Telephone Encounter (Signed)
Left a message for pt to call me back

## 2014-01-03 NOTE — Telephone Encounter (Signed)
She cannot use Imodium if fever or blood in stool. Pepto-Bismol be a safer choice.   So based on the question about fever in stool please let me know and I will advise. If they want to use Pepto-Bismol instead then go ahead and give the order for Pepto-Bismol as needed as directed on the label

## 2014-01-10 DIAGNOSIS — M1710 Unilateral primary osteoarthritis, unspecified knee: Secondary | ICD-10-CM | POA: Diagnosis not present

## 2014-01-11 ENCOUNTER — Other Ambulatory Visit: Payer: Medicare Other

## 2014-01-12 ENCOUNTER — Other Ambulatory Visit: Payer: Medicare Other

## 2014-01-12 DIAGNOSIS — Z7901 Long term (current) use of anticoagulants: Secondary | ICD-10-CM

## 2014-01-12 DIAGNOSIS — Z79899 Other long term (current) drug therapy: Secondary | ICD-10-CM | POA: Diagnosis not present

## 2014-01-12 LAB — PROTIME-INR
INR: 4.08 — ABNORMAL HIGH (ref <1.50)
Prothrombin Time: 38.3 seconds — ABNORMAL HIGH (ref 11.6–15.2)

## 2014-01-13 ENCOUNTER — Telehealth: Payer: Self-pay | Admitting: Medical

## 2014-01-13 NOTE — Telephone Encounter (Signed)
Order for Coumadin order was picked up this morning. Order say for them to hold Coumadin for 2 days then start Coumadin 10MG  each day except Saturday. [name] says she thinks the order meant to say  Take Coumadin 5 MG on Saturday because that is the way pt has been taking this med. If this is correct, they need another order with all of this written out. Call when order ready for pick up.

## 2014-01-13 NOTE — Telephone Encounter (Signed)
No, actually I want her to not do the 5 mg Saturday dose right now.  Just 10mg  all days except no coumadin on Saturday.   Recheck either 3-4 wk.

## 2014-01-13 NOTE — Progress Notes (Signed)
Done earlier today, duplicate request. WGL

## 2014-01-13 NOTE — Telephone Encounter (Signed)
Called to let [name] know that current order that she has is correct and gave Shane's instructions to recheck in 3-4 weeks

## 2014-01-18 ENCOUNTER — Other Ambulatory Visit: Payer: Self-pay | Admitting: Family Medicine

## 2014-01-18 DIAGNOSIS — M1710 Unilateral primary osteoarthritis, unspecified knee: Secondary | ICD-10-CM | POA: Diagnosis not present

## 2014-01-26 ENCOUNTER — Other Ambulatory Visit: Payer: Self-pay | Admitting: Medical

## 2014-01-26 ENCOUNTER — Other Ambulatory Visit: Payer: Self-pay | Admitting: Family Medicine

## 2014-02-01 ENCOUNTER — Other Ambulatory Visit: Payer: Self-pay | Admitting: Family Medicine

## 2014-02-08 ENCOUNTER — Telehealth: Payer: Self-pay | Admitting: Family Medicine

## 2014-02-08 NOTE — Telephone Encounter (Signed)
If inhaler is not helping, wheezing, SOB, or coughing up mucous, then probably should have her come in

## 2014-02-08 NOTE — Telephone Encounter (Signed)
For a while?  Meaning days or weeks?  If weeks, then bring her in.

## 2014-02-08 NOTE — Telephone Encounter (Signed)
She said 6 days. CLS

## 2014-02-08 NOTE — Telephone Encounter (Signed)
[name], called and said that Anays has had a cough for a while and it is not getting better its getting worse. They have tried OTC cough medication, inhaler and humidifier. Nothing seems to help. What else do you suggest that they do? She doesn't have a fever. CLS

## 2014-02-08 NOTE — Telephone Encounter (Signed)
[name] is aware and she made an appointment to bring her in for a OV. CLS

## 2014-02-09 ENCOUNTER — Encounter: Payer: Self-pay | Admitting: Medical

## 2014-02-09 ENCOUNTER — Ambulatory Visit (INDEPENDENT_AMBULATORY_CARE_PROVIDER_SITE_OTHER): Payer: Medicare Other | Admitting: Medical

## 2014-02-09 VITALS — BP 112/60 | HR 72 | Temp 98.0°F | Resp 18 | Wt 206.0 lb

## 2014-02-09 DIAGNOSIS — R05 Cough: Secondary | ICD-10-CM

## 2014-02-09 DIAGNOSIS — R062 Wheezing: Secondary | ICD-10-CM

## 2014-02-09 DIAGNOSIS — Z7901 Long term (current) use of anticoagulants: Secondary | ICD-10-CM

## 2014-02-09 DIAGNOSIS — J441 Chronic obstructive pulmonary disease with (acute) exacerbation: Secondary | ICD-10-CM | POA: Diagnosis not present

## 2014-02-09 DIAGNOSIS — R059 Cough, unspecified: Secondary | ICD-10-CM | POA: Diagnosis not present

## 2014-02-09 DIAGNOSIS — Z79899 Other long term (current) drug therapy: Secondary | ICD-10-CM

## 2014-02-09 LAB — PROTIME-INR
INR: 2.16 — ABNORMAL HIGH (ref <1.50)
Prothrombin Time: 23.2 seconds — ABNORMAL HIGH (ref 11.6–15.2)

## 2014-02-09 MED ORDER — AZITHROMYCIN 250 MG PO TABS: ORAL_TABLET | ORAL | Status: DC

## 2014-02-09 MED ORDER — HYDROCODONE-ACETAMINOPHEN 5-325 MG PO TABS: 1.0000 | ORAL_TABLET | Freq: Three times a day (TID) | ORAL | Status: DC | PRN

## 2014-02-09 MED ORDER — METHYLPREDNISOLONE SODIUM SUCC 125 MG IJ SOLR
125.0000 mg | Freq: Once | INTRAMUSCULAR | Status: AC
  Administered 2014-02-09: 125 mg via INTRAMUSCULAR

## 2014-02-09 MED ORDER — METHYLPREDNISOLONE SODIUM SUCC 125 MG IJ SOLR
125.0000 mg | Freq: Once | INTRAMUSCULAR | Status: DC
Start: 2014-02-09 — End: 2014-02-09

## 2014-02-09 NOTE — Addendum Note (Signed)
Addended by: Leretha Dykes L on: 02/09/2014 12:03 PM   Modules accepted: Orders

## 2014-02-09 NOTE — Patient Instructions (Signed)
  Thank you for giving me the opportunity to serve you today.    Your diagnosis today includes: Encounter Diagnoses  Name Primary?  . Cough Yes  . Wheezing   . COPD exacerbation   . Encounter for long-term (current) use of other medications   . Long term (current) use of anticoagulants      Specific recommendations today include:  Take azithromycin antibiotic for 5 days  Increase albuterol nebulizer as needed the next several days, up to every 6 hours  We gave her 125 mg Solu-Medrol shot today to help with inflammation and wheezing  Can continue the same cough and congestion medicine she is using  Return pending labs.

## 2014-02-09 NOTE — Progress Notes (Signed)
Subjective:  [name] is a 79 y.o. female who presents for worsening cough.  Symptoms include 6 day hx/o cough, chest congestion, sore throat, wheezing, SOB, not feeling well.   Denies fever, NVD, ear pain, sinus pressure.  Treatment to date: Robitussin, albuterol nebs BID.  No sick contacts.   She does not smoke.   She does have a history of COPD.   No other aggravating or relieving factors.   Also wants PT/INR lab.  Takes Coumadin 10mg  daily except no medication Sunday for coumadin.  No other c/o.  The following portions of the patient's history were reviewed and updated as appropriate: allergies, current medications, past family history, past medical history, past social history, past surgical history and problem list.  ROS as in subjective  Past Medical History  Diagnosis Date  . Anemia   . PVD (peripheral vascular disease)   . Hypertension   . Asthma   . Diabetes mellitus   . GERD (gastroesophageal reflux disease)   . COPD (chronic obstructive pulmonary disease)   . Hx of deep venous thrombosis   . Constipation   . Chronic kidney disease   . Thrombocytopenia   . Cardiomyopathy   . Allergy   . Depression   . Mild mental retardation   . Mood disorder      Objective: BP 112/60  Pulse 72  Temp(Src) 98 F (36.7 C) (Oral)  Resp 18  Wt 206 lb (93.441 kg)  SpO2 96%   General appearance: Alert, WD/WN, no distress, ill appearing                             Skin: warm, no rash, no diaphoresis                           Head: no sinus tenderness                            Eyes: conjunctiva normal, corneas clear, PERRLA                            Ears: pearly TMs, external ear canals normal                          Nose: septum midline, turbinates swollen, with erythema and clear discharge             Mouth/throat: MMM, tongue normal, mild pharyngeal erythema                           Neck: supple, no adenopathy, no thyromegaly, nontender                          Heart:  RRR, normal S1, S2, no murmurs                         Lungs: +bronchial breath sounds, +scattered rhonchi, few wheezes, no rales                Extremities: no edema, nontender    Chest xray: left CVA region with possible early infiltrated, quality less than desired due to patient shaking/moving, hard to get good film on her in general, malrotated a bit, but  other obvious pathology.     Assessment: Encounter Diagnoses  Name Primary?  . Cough Yes  . Wheezing   . COPD exacerbation   . Encounter for long-term (current) use of other medications   . Long term (current) use of anticoagulants       Plan:  Medication orders today include: Zpak, 125mg  SoluMedrol IM in office, increase albuterol nebs to q6 hours, rest, hydrate well, can c/t Tussin OTC.   We will call with PT/INR results.Discussed diagnosis and treatment of bronchitis.  Suggested symptomatic OTC remedies for cough and congestion.  Tylenol or Ibuprofen OTC for fever and malaise.  Call/return in 2-3 days if symptoms are worse or not improving.

## 2014-02-16 ENCOUNTER — Other Ambulatory Visit: Payer: Self-pay | Admitting: Medical

## 2014-02-16 ENCOUNTER — Telehealth: Payer: Self-pay | Admitting: Medical

## 2014-02-16 DIAGNOSIS — F3289 Other specified depressive episodes: Secondary | ICD-10-CM | POA: Diagnosis not present

## 2014-02-16 DIAGNOSIS — F329 Major depressive disorder, single episode, unspecified: Secondary | ICD-10-CM | POA: Diagnosis not present

## 2014-02-16 DIAGNOSIS — F71 Moderate intellectual disabilities: Secondary | ICD-10-CM | POA: Diagnosis not present

## 2014-02-16 MED ORDER — ALBUTEROL SULFATE (2.5 MG/3ML) 0.083% IN NEBU
2.5000 mg | INHALATION_SOLUTION | Freq: Four times a day (QID) | RESPIRATORY_TRACT | Status: DC | PRN
Start: 2014-02-16 — End: 2014-08-04

## 2014-02-16 NOTE — Telephone Encounter (Signed)
[name] with [facility] called and states that Miss [name] is doing much better. She states that she was to call back and let you know how she was doing. She also states that at her last office visit it was mentioned that her Warfarin does might need to be changed. She will need written orders if that take place. You can reach [name] on her cell phone [phone].

## 2014-02-16 NOTE — Telephone Encounter (Signed)
I left a message on [name] about [name]'s new orders. CLS

## 2014-02-16 NOTE — Telephone Encounter (Signed)
New order written should be 10mg  Coumadin all days except 5mg  on Sundays.

## 2014-02-16 NOTE — Telephone Encounter (Signed)
[name] came in to pick up rx for pt.  [name] states [facility] is out of her nebulizer medicine.  That they used the last one today.  Please call in Albuterol Sulfate for nebulzer to [facility].  [name] can be reached [phone]

## 2014-02-17 DIAGNOSIS — M1710 Unilateral primary osteoarthritis, unspecified knee: Secondary | ICD-10-CM | POA: Diagnosis not present

## 2014-02-23 ENCOUNTER — Telehealth: Payer: Self-pay

## 2014-02-23 ENCOUNTER — Other Ambulatory Visit: Payer: Self-pay | Admitting: Medical

## 2014-02-23 MED ORDER — BUDESONIDE-FORMOTEROL FUMARATE 160-4.5 MCG/ACT IN AERO: 2.0000 | INHALATION_SPRAY | Freq: Two times a day (BID) | RESPIRATORY_TRACT | Status: DC

## 2014-02-23 NOTE — Telephone Encounter (Signed)
Synetta Fail from pt group home states that pt was her on 02/09/14 and you stated pt was having a COPD flare up and advised her to use her inhaler and albuterol nebulizer and cough medicine to help with pt cough. The cough has not subsided and it is waking pt up out of her sleep at night and she is still having coughing spells through the day. Synetta Fail states they have tried using a cool vapor mist in the pt room to try and help the cough and give her relief but that is not helping either. Synetta Fail would like to know if there is anything else that you suggest? Please advise

## 2014-02-23 NOTE — Telephone Encounter (Signed)
I don't believe she is taking any current COPD preventative medications, so have her begin Symbicort 2 puffs twice daily, rinse mouth out water after each use.   make sure she has not had any swelling in her legs, increased weight gain, chest pain, or decreased oxygen level since her last visit.  If she has had any other symptoms, then recheck in the event there something else going on.  I assume she is afebrile as well?  Let me know.   If no major change other than ongoing difficulty breathing, begin Symbicort and call back with update on symptoms by Friday

## 2014-02-25 NOTE — Telephone Encounter (Signed)
[name] is aware of [name] [facility] message and she will stop by the office to pick up the sample and the directions. CLS  No swelling, No increase weight gain and No chest pain per [name]. CLS [name] is aware. CLS

## 2014-03-03 ENCOUNTER — Other Ambulatory Visit: Payer: Medicare Other

## 2014-03-03 ENCOUNTER — Encounter: Payer: Self-pay | Admitting: Podiatry

## 2014-03-03 ENCOUNTER — Ambulatory Visit (INDEPENDENT_AMBULATORY_CARE_PROVIDER_SITE_OTHER): Payer: Medicare Other | Admitting: Podiatry

## 2014-03-03 DIAGNOSIS — B351 Tinea unguium: Secondary | ICD-10-CM | POA: Diagnosis not present

## 2014-03-03 DIAGNOSIS — Z7901 Long term (current) use of anticoagulants: Secondary | ICD-10-CM

## 2014-03-03 DIAGNOSIS — Z79899 Other long term (current) drug therapy: Secondary | ICD-10-CM | POA: Diagnosis not present

## 2014-03-03 DIAGNOSIS — E119 Type 2 diabetes mellitus without complications: Secondary | ICD-10-CM

## 2014-03-03 DIAGNOSIS — M79609 Pain in unspecified limb: Secondary | ICD-10-CM

## 2014-03-03 DIAGNOSIS — E1059 Type 1 diabetes mellitus with other circulatory complications: Secondary | ICD-10-CM

## 2014-03-03 DIAGNOSIS — Q828 Other specified congenital malformations of skin: Secondary | ICD-10-CM | POA: Diagnosis not present

## 2014-03-03 LAB — PROTIME-INR
INR: 1.71 — ABNORMAL HIGH (ref <1.50)
PROTHROMBIN TIME: 19.5 s — AB (ref 11.6–15.2)

## 2014-03-03 NOTE — Patient Instructions (Signed)
Diabetes and Foot Care Diabetes may cause you to have problems because of poor blood supply (circulation) to your feet and legs. This may cause the skin on your feet to become thinner, break easier, and heal more slowly. Your skin may become dry, and the skin may peel and crack. You may also have nerve damage in your legs and feet causing decreased feeling in them. You may not notice minor injuries to your feet that could lead to infections or more serious problems. Taking care of your feet is one of the most important things you can do for yourself.  HOME CARE INSTRUCTIONS  Wear shoes at all times, even in the house. Do not go barefoot. Bare feet are easily injured.  Check your feet daily for blisters, cuts, and redness. If you cannot see the bottom of your feet, use a mirror or ask someone for help.  Wash your feet with warm water (do not use hot water) and mild soap. Then pat your feet and the areas between your toes until they are completely dry. Do not soak your feet as this can dry your skin.  Apply a moisturizing lotion or petroleum jelly (that does not contain alcohol and is unscented) to the skin on your feet and to dry, brittle toenails. Do not apply lotion between your toes.  Trim your toenails straight across. Do not dig under them or around the cuticle. File the edges of your nails with an emery board or nail file.  Do not cut corns or calluses or try to remove them with medicine.  Wear clean socks or stockings every day. Make sure they are not too tight. Do not wear knee-high stockings since they may decrease blood flow to your legs.  Wear shoes that fit properly and have enough cushioning. To break in new shoes, wear them for just a few hours a day. This prevents you from injuring your feet. Always look in your shoes before you put them on to be sure there are no objects inside.  Do not cross your legs. This may decrease the blood flow to your feet.  If you find a minor scrape,  cut, or break in the skin on your feet, keep it and the skin around it clean and dry. These areas may be cleansed with mild soap and water. Do not cleanse the area with peroxide, alcohol, or iodine.  When you remove an adhesive bandage, be sure not to damage the skin around it.  If you have a wound, look at it several times a day to make sure it is healing.  Do not use heating pads or hot water bottles. They may burn your skin. If you have lost feeling in your feet or legs, you may not know it is happening until it is too late.  Make sure your health care provider performs a complete foot exam at least annually or more often if you have foot problems. Report any cuts, sores, or bruises to your health care provider immediately. SEEK MEDICAL CARE IF:   You have an injury that is not healing.  You have cuts or breaks in the skin.  You have an ingrown nail.  You notice redness on your legs or feet.  You feel burning or tingling in your legs or feet.  You have pain or cramps in your legs and feet.  Your legs or feet are numb.  Your feet always feel cold. SEEK IMMEDIATE MEDICAL CARE IF:   There is increasing redness,   swelling, or pain in or around a wound.  There is a red line that goes up your leg.  Pus is coming from a wound.  You develop a fever or as directed by your health care provider.  You notice a bad smell coming from an ulcer or wound. Document Released: 10/11/2000 Document Revised: 06/16/2013 Document Reviewed: 03/23/2013 Exit[employer] Patient Information 2014 [employer].  

## 2014-03-04 ENCOUNTER — Other Ambulatory Visit: Payer: Self-pay | Admitting: Family Medicine

## 2014-03-04 ENCOUNTER — Other Ambulatory Visit: Payer: Self-pay | Admitting: Medical

## 2014-03-04 NOTE — Telephone Encounter (Signed)
Is this ok to refill?  

## 2014-03-04 NOTE — Progress Notes (Signed)
Subjective:     Patient ID: [name], female   DOB: July 30, 1935, 79 y.o.   MRN: [id number]  HPI patient presents with nail disease 1-5 of both feet that she cannot take care of and lesions on the digits that she has trouble taking care of secondary to diabetes and inability to trim. Presents with caregiver   Review of Systems     Objective:   Physical Exam Neurovascular status unchanged with thick brittle nailbeds 1-5 both feet and keratotic lesions fifth digits both feet that become tender if they grow to thick    Assessment:     Mycotic nail infection with pain 1-5 both feet and lesions of both feet with at risk diabetic who has long-term history of neurological loss and vascular issues secondary to health and diabetes    Plan:     Debridement of nailbeds 1-5 both feet with no iatrogenic bleeding noted and debridement of lesions on both feet with no iatrogenic bleeding noted

## 2014-03-07 ENCOUNTER — Telehealth: Payer: Self-pay | Admitting: Medical

## 2014-03-07 ENCOUNTER — Other Ambulatory Visit: Payer: Self-pay | Admitting: Medical

## 2014-03-07 MED ORDER — RIVAROXABAN 20 MG PO TABS: 20.0000 mg | ORAL_TABLET | Freq: Every day | ORAL | Status: DC

## 2014-03-07 NOTE — Telephone Encounter (Signed)
[name] state she will need a written order from [facility] stating to stop Coumadin and to start Xarelto. Call [name] @ [phone] when order ready to be picked up

## 2014-03-07 NOTE — Telephone Encounter (Signed)
Yes, lets go ahead and switch.  So STOP/Discontinue Coumadin, and changes to Xarelto today.

## 2014-03-07 NOTE — Telephone Encounter (Signed)
[name] is aware that script is ready for pick up with the change in her medication on it per Crosby Oyster PAC. CLS

## 2014-03-07 NOTE — Telephone Encounter (Signed)
pls handle this 

## 2014-03-28 ENCOUNTER — Other Ambulatory Visit: Payer: Self-pay | Admitting: Medical

## 2014-03-31 ENCOUNTER — Other Ambulatory Visit: Payer: Self-pay | Admitting: Medical

## 2014-03-31 ENCOUNTER — Other Ambulatory Visit: Payer: Self-pay | Admitting: Family Medicine

## 2014-04-12 ENCOUNTER — Encounter: Payer: Self-pay | Admitting: Medical

## 2014-04-12 ENCOUNTER — Ambulatory Visit
Admission: RE | Admit: 2014-04-12 | Discharge: 2014-04-12 | Disposition: A | Discharge status: Home / Self Care | Payer: Medicare Other | Source: Ambulatory Visit | Attending: Medical | Admitting: Medical

## 2014-04-12 ENCOUNTER — Other Ambulatory Visit: Payer: Self-pay | Admitting: Medical

## 2014-04-12 ENCOUNTER — Ambulatory Visit (INDEPENDENT_AMBULATORY_CARE_PROVIDER_SITE_OTHER): Payer: Medicare Other | Admitting: Medical

## 2014-04-12 VITALS — BP 120/58 | HR 84 | Temp 99.8°F | Resp 18

## 2014-04-12 DIAGNOSIS — R002 Palpitations: Secondary | ICD-10-CM

## 2014-04-12 DIAGNOSIS — R059 Cough, unspecified: Secondary | ICD-10-CM

## 2014-04-12 DIAGNOSIS — R0602 Shortness of breath: Secondary | ICD-10-CM | POA: Diagnosis not present

## 2014-04-12 DIAGNOSIS — R5381 Other malaise: Secondary | ICD-10-CM

## 2014-04-12 DIAGNOSIS — E119 Type 2 diabetes mellitus without complications: Secondary | ICD-10-CM | POA: Diagnosis not present

## 2014-04-12 DIAGNOSIS — R531 Weakness: Secondary | ICD-10-CM

## 2014-04-12 DIAGNOSIS — R404 Transient alteration of awareness: Secondary | ICD-10-CM | POA: Diagnosis not present

## 2014-04-12 DIAGNOSIS — R5383 Other fatigue: Secondary | ICD-10-CM | POA: Diagnosis not present

## 2014-04-12 DIAGNOSIS — J984 Other disorders of lung: Secondary | ICD-10-CM | POA: Diagnosis not present

## 2014-04-12 DIAGNOSIS — J449 Chronic obstructive pulmonary disease, unspecified: Secondary | ICD-10-CM | POA: Diagnosis not present

## 2014-04-12 DIAGNOSIS — R05 Cough: Secondary | ICD-10-CM

## 2014-04-12 DIAGNOSIS — R400 Somnolence: Secondary | ICD-10-CM

## 2014-04-12 DIAGNOSIS — G20 Parkinson's disease: Secondary | ICD-10-CM

## 2014-04-12 DIAGNOSIS — M1710 Unilateral primary osteoarthritis, unspecified knee: Secondary | ICD-10-CM | POA: Diagnosis not present

## 2014-04-12 DIAGNOSIS — I10 Essential (primary) hypertension: Secondary | ICD-10-CM

## 2014-04-12 LAB — CBC WITH DIFFERENTIAL/PLATELET
Basophils Absolute: 0 10*3/uL (ref 0.0–0.1)
Basophils Relative: 0 % (ref 0–1)
EOS PCT: 0 % (ref 0–5)
Eosinophils Absolute: 0 10*3/uL (ref 0.0–0.7)
HCT: 27.9 % — ABNORMAL LOW (ref 36.0–46.0)
Hemoglobin: 9.5 g/dL — ABNORMAL LOW (ref 12.0–15.0)
LYMPHS ABS: 0.7 10*3/uL (ref 0.7–4.0)
LYMPHS PCT: 7 % — AB (ref 12–46)
MCH: 29.4 pg (ref 26.0–34.0)
MCHC: 34.1 g/dL (ref 30.0–36.0)
MCV: 86.4 fL (ref 78.0–100.0)
Monocytes Absolute: 1.6 10*3/uL — ABNORMAL HIGH (ref 0.1–1.0)
Monocytes Relative: 15 % — ABNORMAL HIGH (ref 3–12)
Neutro Abs: 8.3 10*3/uL — ABNORMAL HIGH (ref 1.7–7.7)
Neutrophils Relative %: 78 % — ABNORMAL HIGH (ref 43–77)
Platelets: 169 10*3/uL (ref 150–400)
RBC: 3.23 MIL/uL — AB (ref 3.87–5.11)
RDW: 13.2 % (ref 11.5–15.5)
WBC: 10.7 10*3/uL — ABNORMAL HIGH (ref 4.0–10.5)

## 2014-04-12 LAB — BASIC METABOLIC PANEL
BUN: 30 mg/dL — ABNORMAL HIGH (ref 6–23)
CO2: 28 mEq/L (ref 19–32)
Calcium: 9.3 mg/dL (ref 8.4–10.5)
Chloride: 98 mEq/L (ref 96–112)
Creat: 1.44 mg/dL — ABNORMAL HIGH (ref 0.50–1.10)
Glucose, Bld: 134 mg/dL — ABNORMAL HIGH (ref 70–99)
POTASSIUM: 4.5 meq/L (ref 3.5–5.3)
Sodium: 137 mEq/L (ref 135–145)

## 2014-04-12 LAB — HEPATIC FUNCTION PANEL
ALT: 8 U/L (ref 0–35)
AST: 15 U/L (ref 0–37)
Albumin: 3.5 g/dL (ref 3.5–5.2)
Alkaline Phosphatase: 60 U/L (ref 39–117)
BILIRUBIN DIRECT: 0.1 mg/dL (ref 0.0–0.3)
BILIRUBIN INDIRECT: 0.4 mg/dL (ref 0.2–1.2)
Total Bilirubin: 0.5 mg/dL (ref 0.2–1.2)
Total Protein: 6 g/dL (ref 6.0–8.3)

## 2014-04-12 LAB — APTT: aPTT: 31 seconds (ref 24–37)

## 2014-04-12 LAB — PROTIME-INR
INR: 1.24 (ref <1.50)
Prothrombin Time: 15.4 seconds — ABNORMAL HIGH (ref 11.6–15.2)

## 2014-04-12 LAB — BRAIN NATRIURETIC PEPTIDE: Brain Natriuretic Peptide: 183 pg/mL — ABNORMAL HIGH (ref 0.0–100.0)

## 2014-04-12 LAB — TSH: TSH: 0.426 u[IU]/mL (ref 0.350–4.500)

## 2014-04-12 MED ORDER — CEFUROXIME AXETIL 500 MG PO TABS: 500.0000 mg | ORAL_TABLET | Freq: Two times a day (BID) | ORAL | Status: DC

## 2014-04-12 NOTE — Progress Notes (Signed)
Subjective: Here today with [name] from the group home.  Lately not feeling well.  Never got over the cough from last visit, but worse in teh last 3 days generally weak, not having energy to walk, can't keep eyes open, seems sleepy all the time, worse resting tremor, runny nose and nasal congestion.  Fatigued, no energy.  Was recently started on Xarelto by us.  Some worse urinary incontinence of late.  Seems off balance at times.  No recent neurology visit, last saw last year/2014, diagnosed with parkinsonian symptoms.  No hypoglycemia, no hyperglycemia, no new med changes, no missing med doses.  She has been doing abluterol nebs some, and caretaker worried about her breatihng during the night last night.  No other symptoms.    Past Medical History  Diagnosis Date  . Anemia   . PVD (peripheral vascular disease)   . Hypertension   . Asthma   . Diabetes mellitus   . GERD (gastroesophageal reflux disease)   . COPD (chronic obstructive pulmonary disease)   . Hx of deep venous thrombosis   . Constipation   . Chronic kidney disease   . Thrombocytopenia   . Cardiomyopathy   . Allergy   . Depression   . Mild mental retardation   . Mood disorder     Review of Systems Constitutional: -fever, -chills, -sweats, -unexpected weight change,+fatigue ENT: +runny nose, -ear pain, -sore throat Cardiology:  -chest pain, +palpitations, -edema Respiratory: +dyspnea, +cough, +shortness of breath, +wheezing Gastroenterology: -abdominal pain, -nausea, -vomiting, -diarrhea, -constipation Hematology: -bleeding, +some increase bruising  Musculoskeletal: +knee pains (sees ortho today), -myalgias, -joint swelling, -back pain Ophthalmology: -vision changes Urology: -dysuria, -difficulty urinating, -hematuria, -urinary frequency, -urgency Neurology: no headache, +general weakness, -tingling, -numbness   Objective:  Filed Vitals:   04/12/14 1136  BP: 120/58  Pulse: 84  Temp: 99.8 F (37.7 C)  Resp:  18   Wt Readings from Last 3 Encounters:  02/09/14 206 lb (93.441 kg)  10/25/13 213 lb (96.616 kg)  08/23/13 203 lb (92.08 kg)    BP Readings from Last 3 Encounters:  04/12/14 120/58  02/09/14 112/60  10/27/13 110/60     General appearance: alert, no distress, WD/WN, seated in wheelchair, less cheerful as her normal self, obvious resting tremor HEENT: normocephalic, sclerae anicteric, PERRLA, EOMi, nares with clear discharge, no erythema, pharynx normal Oral cavity: MMM, no lesions Neck: supple, no lymphadenopathy, no thyromegaly, no masses, no JVD Heart: RRR, normal S1, S2, no murmurs Lungs: CTA bilaterally, no wheezes, rhonchi,+ rales bilat lung bases Abdomen: +bs, soft, non tender, non distended, no masses, no hepatomegaly, no splenomegaly Back: non tender Extremities: no edema, no cyanosis, no clubbing Pulses: 2+ symmetric, upper and lower extremities, normal cap refill Neurological: obvious resting tremor, alert, answers questions appropriately.  No obvious focal deficits.      Adult ECG Report  Indication: weakness, palpitations  Rate: 83 bpm  Rhythm: normal sinus rhythm  QRS Axis: -25 degrees  PR Interval: 190ms  QRS Duration: 100ms  QTc: 406ms  Conduction Disturbances: none  Other Abnormalities: repolarization abnormality  Patient's cardiac risk factors are: advanced age (older than 6855 for men, 3765 for women), diabetes mellitus, dyslipidemia, female gender and sedentary lifestyle.  EKG comparison: no acute change compared to 10/2012 EKG  Narrative Interpretation: no acute change     Assessment: Encounter Diagnoses  Name Primary?  . Other malaise and fatigue Yes  . Generalized weakness   . Shortness of breath   . Cough   .  Parkinsonian tremor   . Somnolence   . Palpitation   . Type II or unspecified type diabetes mellitus without mention of complication, not stated as uncontrolled   . COPD (chronic obstructive pulmonary disease)   . Essential hypertension,  benign     Plan: Etiology unclear.  There were some rales in lung bases, symptoms are varied.   Will get STAT labs, EKG ,CXR today.   Wide differential at this point, and some symptoms likely not necessarily related.  We will call back today with results.

## 2014-04-12 NOTE — Addendum Note (Signed)
Addended by: Jac Canavan on: 04/12/2014 04:36 PM   Modules accepted: Orders

## 2014-04-12 NOTE — Addendum Note (Signed)
Addended by: Janeice Robinson on: 04/12/2014 04:31 PM   Modules accepted: Orders

## 2014-04-14 ENCOUNTER — Telehealth: Payer: Self-pay | Admitting: Medical

## 2014-04-14 ENCOUNTER — Other Ambulatory Visit: Payer: Self-pay | Admitting: Medical

## 2014-04-14 MED ORDER — ALBUTEROL SULFATE HFA 108 (90 BASE) MCG/ACT IN AERS: 2.0000 | INHALATION_SPRAY | Freq: Four times a day (QID) | RESPIRATORY_TRACT | Status: DC | PRN

## 2014-04-14 NOTE — Telephone Encounter (Signed)
Needs refill on albuterol inhaler   [name]

## 2014-04-20 ENCOUNTER — Telehealth: Payer: Self-pay | Admitting: Medical

## 2014-04-20 NOTE — Telephone Encounter (Signed)
error 

## 2014-04-21 ENCOUNTER — Other Ambulatory Visit: Payer: Self-pay | Admitting: Family Medicine

## 2014-04-21 NOTE — Telephone Encounter (Signed)
Is this ok to refill?  

## 2014-04-25 ENCOUNTER — Other Ambulatory Visit: Payer: Self-pay | Admitting: Family Medicine

## 2014-04-25 ENCOUNTER — Other Ambulatory Visit: Payer: Self-pay | Admitting: Medical

## 2014-05-16 ENCOUNTER — Other Ambulatory Visit: Payer: Self-pay | Admitting: Family Medicine

## 2014-05-20 ENCOUNTER — Ambulatory Visit (INDEPENDENT_AMBULATORY_CARE_PROVIDER_SITE_OTHER): Payer: Medicare Other | Admitting: Nurse Practitioner

## 2014-05-20 ENCOUNTER — Encounter: Payer: Self-pay | Admitting: Nurse Practitioner

## 2014-05-20 ENCOUNTER — Telehealth: Payer: Self-pay | Admitting: Medical

## 2014-05-20 VITALS — BP 122/64 | HR 66 | Temp 98.5°F | Ht 72.0 in | Wt 220.0 lb

## 2014-05-20 DIAGNOSIS — IMO0002 Reserved for concepts with insufficient information to code with codable children: Secondary | ICD-10-CM | POA: Diagnosis not present

## 2014-05-20 DIAGNOSIS — R413 Other amnesia: Secondary | ICD-10-CM

## 2014-05-20 DIAGNOSIS — F70 Mild intellectual disabilities: Secondary | ICD-10-CM

## 2014-05-20 DIAGNOSIS — R259 Unspecified abnormal involuntary movements: Secondary | ICD-10-CM | POA: Diagnosis not present

## 2014-05-20 NOTE — Telephone Encounter (Signed)
[name]  The caregiver is aware. CLS
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
## 2014-05-20 NOTE — Progress Notes (Signed)
I reviewed note and agree with plan.   VIKRAM R. PENUMALLI, MD  Certified in Neurology, Neurophysiology and Neuroimaging  [facility] [address], [address] [place], [place] [address] [phone]   

## 2014-05-20 NOTE — Telephone Encounter (Signed)
At this point, can use either the handheld or nebulized albuterol, but have her f/u next week. May need to see pulmonology if we can't narrow down the triggers/causes.

## 2014-05-20 NOTE — Patient Instructions (Signed)
The abnormal involuntary movements that Para March has could be either side effects form long term use of Reglan and Psychotropics or due to Parkinson's Disease.  Unless they are causing functional problems getting up from a seated position, walking, or eating and drinking, I do not recommend starting another medication for this.  The side effects (hallucinations, psychosis) from the medications to treat these symptoms may be worse than the tremors.  Follow up as needed, if there are functional worsening of symptoms.

## 2014-05-23 ENCOUNTER — Other Ambulatory Visit: Payer: Self-pay | Admitting: Medical

## 2014-05-25 ENCOUNTER — Other Ambulatory Visit: Payer: Self-pay | Admitting: Medical

## 2014-05-26 ENCOUNTER — Encounter: Payer: Self-pay | Admitting: Medical

## 2014-05-26 ENCOUNTER — Ambulatory Visit (INDEPENDENT_AMBULATORY_CARE_PROVIDER_SITE_OTHER): Payer: Medicare Other | Admitting: Medical

## 2014-05-26 VITALS — BP 110/60 | HR 80 | Temp 97.7°F | Resp 16 | Wt 212.0 lb

## 2014-05-26 DIAGNOSIS — E119 Type 2 diabetes mellitus without complications: Secondary | ICD-10-CM | POA: Diagnosis not present

## 2014-05-26 DIAGNOSIS — Z79899 Other long term (current) drug therapy: Secondary | ICD-10-CM

## 2014-05-26 DIAGNOSIS — R0989 Other specified symptoms and signs involving the circulatory and respiratory systems: Secondary | ICD-10-CM

## 2014-05-26 DIAGNOSIS — R0609 Other forms of dyspnea: Secondary | ICD-10-CM

## 2014-05-26 DIAGNOSIS — R259 Unspecified abnormal involuntary movements: Secondary | ICD-10-CM

## 2014-05-26 DIAGNOSIS — D649 Anemia, unspecified: Secondary | ICD-10-CM | POA: Diagnosis not present

## 2014-05-26 DIAGNOSIS — S4980XA Other specified injuries of shoulder and upper arm, unspecified arm, initial encounter: Secondary | ICD-10-CM | POA: Diagnosis not present

## 2014-05-26 DIAGNOSIS — S46909A Unspecified injury of unspecified muscle, fascia and tendon at shoulder and upper arm level, unspecified arm, initial encounter: Secondary | ICD-10-CM

## 2014-05-26 DIAGNOSIS — S4991XA Unspecified injury of right shoulder and upper arm, initial encounter: Secondary | ICD-10-CM

## 2014-05-26 DIAGNOSIS — R251 Tremor, unspecified: Secondary | ICD-10-CM

## 2014-05-26 DIAGNOSIS — R0602 Shortness of breath: Secondary | ICD-10-CM | POA: Diagnosis not present

## 2014-05-26 DIAGNOSIS — R0600 Dyspnea, unspecified: Secondary | ICD-10-CM

## 2014-05-26 LAB — CBC WITH DIFFERENTIAL/PLATELET
BASOS PCT: 0 % (ref 0–1)
Basophils Absolute: 0 10*3/uL (ref 0.0–0.1)
Eosinophils Absolute: 0.2 10*3/uL (ref 0.0–0.7)
Eosinophils Relative: 3 % (ref 0–5)
HCT: 29.7 % — ABNORMAL LOW (ref 36.0–46.0)
Hemoglobin: 10 g/dL — ABNORMAL LOW (ref 12.0–15.0)
Lymphocytes Relative: 17 % (ref 12–46)
Lymphs Abs: 1 10*3/uL (ref 0.7–4.0)
MCH: 29.2 pg (ref 26.0–34.0)
MCHC: 33.7 g/dL (ref 30.0–36.0)
MCV: 86.6 fL (ref 78.0–100.0)
MONOS PCT: 11 % (ref 3–12)
Monocytes Absolute: 0.6 10*3/uL (ref 0.1–1.0)
NEUTROS ABS: 4 10*3/uL (ref 1.7–7.7)
NEUTROS PCT: 69 % (ref 43–77)
Platelets: 133 10*3/uL — ABNORMAL LOW (ref 150–400)
RBC: 3.43 MIL/uL — ABNORMAL LOW (ref 3.87–5.11)
RDW: 13.9 % (ref 11.5–15.5)
WBC: 5.8 10*3/uL (ref 4.0–10.5)

## 2014-05-26 LAB — POCT GLYCOSYLATED HEMOGLOBIN (HGB A1C): Hemoglobin A1C: 6.2

## 2014-05-26 LAB — COMPREHENSIVE METABOLIC PANEL
ALK PHOS: 66 U/L (ref 39–117)
ALT: 9 U/L (ref 0–35)
AST: 12 U/L (ref 0–37)
Albumin: 3.7 g/dL (ref 3.5–5.2)
BILIRUBIN TOTAL: 0.3 mg/dL (ref 0.2–1.2)
BUN: 25 mg/dL — AB (ref 6–23)
CO2: 32 mEq/L (ref 19–32)
Calcium: 9 mg/dL (ref 8.4–10.5)
Chloride: 98 mEq/L (ref 96–112)
Creat: 1.31 mg/dL — ABNORMAL HIGH (ref 0.50–1.10)
GLUCOSE: 122 mg/dL — AB (ref 70–99)
Potassium: 5.1 mEq/L (ref 3.5–5.3)
Sodium: 135 mEq/L (ref 135–145)
Total Protein: 6.1 g/dL (ref 6.0–8.3)

## 2014-05-26 LAB — IRON AND TIBC
%SAT: 22 % (ref 20–55)
Iron: 59 ug/dL (ref 42–145)
TIBC: 270 ug/dL (ref 250–470)
UIBC: 211 ug/dL (ref 125–400)

## 2014-05-26 NOTE — Progress Notes (Signed)
Subjective: Here with [name], caregiver from Group Home.   Has multiple concerns.  Recently saw neurology again for tremors, several recent falls.  They felt that she likely has Parkinson's but didn't feel that she should begin any particular medications for tremor or memory decline.  [name] feels that she is now at a point where she likely will need skilled nursing facility and not the Group Home setting.  They are having a meeting with her family soon given her declining health.  She continues to c/o difficulty breathing ongoing for over 1.5 months now.  She has been treated in June by me for pneumonia suspected and COPD flare, but no worse or better despite Symbicort and albuterol use. This past 34mo has been more SOB in general.  No edema.  No fever, no productive cough.  Can't recall last PFTs.  No prior pulmonology consult in a long time.  Here for f/u on diabetes.  Consistent diet, compliant with medications.    Anemia - no bleeding, but here for f/u on worse hemoglobin from last visit.   Larey Seat last week, 05/19/14, was standing and fell against wall causign a dent in the drywall.  Has big bruise on right arm, wants this checked out although Reece Levy says she moves her arm fine at home and no c/o with motion of the arm.   ROS as in subjective  Objective: Filed Vitals:   05/26/14 1448  BP: 110/60  Pulse: 80  Temp: 97.7 F (36.5 C)  Resp: 16   General appearance: alert, no distress, WD/WN, seated, pleasant with noticeable resting tremor HEENT: normocephalic, sclerae anicteric, TMs pearly, nares patent, no discharge or erythema, pharynx normal Oral cavity: MMM, no lesions Neck: supple, no lymphadenopathy, no thyromegaly, no masses Heart: RRR, normal S1, S2, no murmurs Lungs: bilat lower field crackles, otherwise clear, no wheezes, rhonchi Abdomen: +bs, soft, non tender, non distended, no masses, no hepatomegaly, no splenomegaly Pulses: 2+ symmetric, upper and lower extremities, normal  cap refill Ext: no edema MSK: right posterior shoulder and upper arm with diffuse yellow brown healing ecchymosis, otherwise mild tender posterior deltoid, otherwise nontender, normal ROM    Assessment:  Encounter Diagnoses  Name Primary?  Marland Kitchen Anemia, unspecified Yes  . Dyspnea   . Shoulder injury, right, initial encounter   . High risk medication use   . Type II or unspecified type diabetes mellitus without mention of complication, not stated as uncontrolled   . SOB (shortness of breath) on exertion   . Tremor    Plan: Anemia - worsening hemoglobin from last visit without obvious cause.  F/u pending lab recheck.  Dyspnea, SOB, hx/o COPD - pending labs consider echo vs pulmonology consult  High risk medications - labs today  DM II - hemoglobin A1C today 6.3%, c/t current medications, diabetic diet.  Tremor - reviewed recent neurology notes.  Possibly due to psychotropic adverse effects or Reglan adverse effects long term which we have stopped twice prior only to have her have worse GI c/o.  No new medications for tremor at this time.  Shoulder injury - she is mildly tender, bruise is large but healing, and she seems to have full ROM without major discomfort. Declines xray.

## 2014-05-27 ENCOUNTER — Other Ambulatory Visit: Payer: Self-pay | Admitting: Medical

## 2014-05-27 ENCOUNTER — Telehealth: Payer: Self-pay | Admitting: Medical

## 2014-05-27 LAB — PRO B NATRIURETIC PEPTIDE: Pro B Natriuretic peptide (BNP): 766.8 pg/mL — ABNORMAL HIGH (ref <451)

## 2014-05-27 LAB — MICROALBUMIN / CREATININE URINE RATIO
CREATININE, URINE: 98.2 mg/dL
MICROALB/CREAT RATIO: 10.3 mg/g (ref 0.0–30.0)
Microalb, Ur: 1.01 mg/dL (ref 0.00–1.89)

## 2014-05-27 LAB — FERRITIN: FERRITIN: 301 ng/mL — AB (ref 10–291)

## 2014-05-27 MED ORDER — FUROSEMIDE 40 MG PO TABS: 40.0000 mg | ORAL_TABLET | Freq: Every day | ORAL | Status: DC

## 2014-05-27 NOTE — Telephone Encounter (Signed)
[name] is aware of [name] [facility] message and she agreed and understood. I also wrote out the changes he wants made in her her medication and they will pick it up this afternoon. CLS

## 2014-05-27 NOTE — Telephone Encounter (Signed)
pls see note 

## 2014-05-27 NOTE — Telephone Encounter (Signed)
[name] called after seeing notes on Mychart,  States needs written orders for all the changes before she can make any changes to pt's medications.  Also she requests that our office call her instead of using Mychart to let them know of any changes in medication, lab results, etc.  She states she just happened to see the notification and checked the Mychart but prefers a phone call.

## 2014-05-30 ENCOUNTER — Other Ambulatory Visit: Payer: Self-pay | Admitting: Family Medicine

## 2014-05-30 DIAGNOSIS — H04129 Dry eye syndrome of unspecified lacrimal gland: Secondary | ICD-10-CM | POA: Diagnosis not present

## 2014-05-30 DIAGNOSIS — H43819 Vitreous degeneration, unspecified eye: Secondary | ICD-10-CM | POA: Diagnosis not present

## 2014-05-30 DIAGNOSIS — E119 Type 2 diabetes mellitus without complications: Secondary | ICD-10-CM | POA: Diagnosis not present

## 2014-05-30 DIAGNOSIS — Z961 Presence of intraocular lens: Secondary | ICD-10-CM | POA: Diagnosis not present

## 2014-05-30 LAB — HM DIABETES EYE EXAM

## 2014-06-07 ENCOUNTER — Ambulatory Visit (INDEPENDENT_AMBULATORY_CARE_PROVIDER_SITE_OTHER): Payer: Medicare Other | Admitting: Medical

## 2014-06-07 ENCOUNTER — Encounter: Payer: Self-pay | Admitting: Medical

## 2014-06-07 VITALS — BP 100/60 | HR 72 | Temp 97.5°F | Resp 16 | Wt 206.0 lb

## 2014-06-07 DIAGNOSIS — R7989 Other specified abnormal findings of blood chemistry: Secondary | ICD-10-CM

## 2014-06-07 DIAGNOSIS — R0600 Dyspnea, unspecified: Secondary | ICD-10-CM

## 2014-06-07 DIAGNOSIS — R0602 Shortness of breath: Secondary | ICD-10-CM

## 2014-06-07 DIAGNOSIS — R0989 Other specified symptoms and signs involving the circulatory and respiratory systems: Secondary | ICD-10-CM

## 2014-06-07 DIAGNOSIS — T148XXA Other injury of unspecified body region, initial encounter: Secondary | ICD-10-CM

## 2014-06-07 DIAGNOSIS — R0609 Other forms of dyspnea: Secondary | ICD-10-CM

## 2014-06-07 DIAGNOSIS — R799 Abnormal finding of blood chemistry, unspecified: Secondary | ICD-10-CM | POA: Diagnosis not present

## 2014-06-07 DIAGNOSIS — R5381 Other malaise: Secondary | ICD-10-CM

## 2014-06-07 DIAGNOSIS — Z23 Encounter for immunization: Secondary | ICD-10-CM

## 2014-06-07 DIAGNOSIS — R32 Unspecified urinary incontinence: Secondary | ICD-10-CM

## 2014-06-07 LAB — CBC
HCT: 30.2 % — ABNORMAL LOW (ref 36.0–46.0)
Hemoglobin: 10.6 g/dL — ABNORMAL LOW (ref 12.0–15.0)
MCH: 29.9 pg (ref 26.0–34.0)
MCHC: 35.1 g/dL (ref 30.0–36.0)
MCV: 85.1 fL (ref 78.0–100.0)
Platelets: 143 10*3/uL — ABNORMAL LOW (ref 150–400)
RBC: 3.55 MIL/uL — ABNORMAL LOW (ref 3.87–5.11)
RDW: 13.4 % (ref 11.5–15.5)
WBC: 7 10*3/uL (ref 4.0–10.5)

## 2014-06-07 LAB — BASIC METABOLIC PANEL
BUN: 43 mg/dL — ABNORMAL HIGH (ref 6–23)
CALCIUM: 8.6 mg/dL (ref 8.4–10.5)
CO2: 28 mEq/L (ref 19–32)
Chloride: 97 mEq/L (ref 96–112)
Creat: 2.07 mg/dL — ABNORMAL HIGH (ref 0.50–1.10)
GLUCOSE: 170 mg/dL — AB (ref 70–99)
Potassium: 4.5 mEq/L (ref 3.5–5.3)
Sodium: 134 mEq/L — ABNORMAL LOW (ref 135–145)

## 2014-06-07 LAB — PROTIME-INR
INR: 2.78 — ABNORMAL HIGH (ref <1.50)
Prothrombin Time: 29.3 seconds — ABNORMAL HIGH (ref 11.6–15.2)

## 2014-06-07 LAB — APTT: aPTT: 102 seconds — ABNORMAL HIGH (ref 24–37)

## 2014-06-07 LAB — BRAIN NATRIURETIC PEPTIDE: Brain Natriuretic Peptide: 39 pg/mL (ref 0.0–100.0)

## 2014-06-07 NOTE — Progress Notes (Signed)
Subjective:   [name] is a 79 y.o. female presenting on 06/07/2014 with here to have forms filled out  Her today with [name] from the group home.  Her main issue today is to have it an FL2 form completed.  In the last several months she has had declining functional status, worse tremor, worse incontinence, more passes activity level.  The group home feels that her level of care is leaning now towards full-time care/skilled nursing facility since she is no longer independent as she once was and declining.   She continues to have shortness of breath, however after her last visit on 05/26/14 I added Lasix 40 mg and her cough is now subsided. She still using albuterol nebulizer at night and a couple times a day due to shortness of breath. They deny any edema or weight gain  She would like her pneumococcal vaccine today  Since being on Xarelto instead of Coumadin, and instead of having purplish bruising marks on forearms, she now has some brown spots on her arms that  don't seem to go away.  She saw neurology recently for tremor, tremor has gotten worse over time, and now she's having worse bladder and occasional stool incontinence  No other complaint.  Review of Systems ROS as in subjective      Objective:    Filed Vitals:   06/07/14 1339  BP: 100/60  Pulse: 72  Temp: 97.5 F (36.4 C)  Resp: 16    General appearance: alert, no distress, WD/WN Skin: several brown red flat areas of ecchymosis of bilat forearms, R>L HEENT: normocephalic, sclerae anicteric, TMs pearly, nares patent, no discharge or erythema, pharynx normal Oral cavity: MMM, no lesions Neck: supple, no lymphadenopathy, no thyromegaly, no masses Heart: RRR, normal S1, S2, no murmurs Lungs: CTA bilaterally, no wheezes, rhonchi, or rales Abdomen: +bs, soft, non tender, non distended, no masses, no hepatomegaly, no splenomegaly Pulses: 1+ symmetric, upper and lower extremities, normal cap refill Ext: no  edema     Assessment: Encounter Diagnoses  Name Primary?  . SOB (shortness of breath) Yes  . Elevated brain natriuretic peptide (BNP) level   . PND (paroxysmal nocturnal dyspnea)   . Bruising   . Declining functional status   . Incontinence   . Need for prophylactic vaccination against Streptococcus pneumoniae (pneumococcus)      Plan: Shortness of breath, elevated BNP, PND-I suspect this is mostly cardiac/CHF in nature.  Recent BNP was over 700.  We will recheck labs today since she has been on Lasix 40 mg for the past week.   We discussed checking weight either daily or every other day, and if any 5 pound or more weight gain to call immediately.  Limit salt, continue her normal fluid intake.  Although I don't believe this is pulmonary related, she is still compliant with her daily Symbicort. Albuterol when necessary, elevate head of bed at night.  We will also set up for pulse oximetry overnight.  Bruising-labs today  Declining functional status-completed the FL2 form today, unfortunately she continues to decline in the past year, and the group home is looking to have her placed most likely in a skilled nursing facility  Incontinence-worse in recent months, we'll check urinalysis today  Counseled on the pneumococcal vaccine.  Vaccine information sheet given.  Pneumococcal Prevnar 13 vaccine given after consent obtained.   Rashell was seen today for here to have forms filled out.  Diagnoses and associated orders for this visit:  SOB (  shortness of breath) - CBC - Basic metabolic panel - Brain natriuretic peptide - Protime-INR - APTT - Pulse oximetry, overnight; Future - Ambulatory referral to Cardiology  Elevated brain natriuretic peptide (BNP) level - CBC - Basic metabolic panel - Brain natriuretic peptide - Protime-INR - APTT - Pulse oximetry, overnight; Future - Ambulatory referral to Cardiology  PND (paroxysmal nocturnal dyspnea) - Pulse oximetry, overnight;  Future  Bruising  Declining functional status  Incontinence  Need for prophylactic vaccination against Streptococcus pneumoniae (pneumococcus)     Return pending labs.

## 2014-06-08 ENCOUNTER — Other Ambulatory Visit: Payer: Self-pay | Admitting: Family Medicine

## 2014-06-08 DIAGNOSIS — N289 Disorder of kidney and ureter, unspecified: Secondary | ICD-10-CM

## 2014-06-09 ENCOUNTER — Encounter: Payer: Self-pay | Admitting: Podiatry

## 2014-06-09 ENCOUNTER — Telehealth: Payer: Self-pay | Admitting: Medical

## 2014-06-09 ENCOUNTER — Other Ambulatory Visit (INDEPENDENT_AMBULATORY_CARE_PROVIDER_SITE_OTHER): Payer: Medicare Other

## 2014-06-09 ENCOUNTER — Ambulatory Visit (INDEPENDENT_AMBULATORY_CARE_PROVIDER_SITE_OTHER): Payer: Medicare Other | Admitting: Podiatry

## 2014-06-09 DIAGNOSIS — E1159 Type 2 diabetes mellitus with other circulatory complications: Secondary | ICD-10-CM

## 2014-06-09 DIAGNOSIS — E119 Type 2 diabetes mellitus without complications: Secondary | ICD-10-CM | POA: Diagnosis not present

## 2014-06-09 DIAGNOSIS — R82998 Other abnormal findings in urine: Secondary | ICD-10-CM

## 2014-06-09 DIAGNOSIS — M79673 Pain in unspecified foot: Secondary | ICD-10-CM

## 2014-06-09 DIAGNOSIS — B351 Tinea unguium: Secondary | ICD-10-CM | POA: Diagnosis not present

## 2014-06-09 DIAGNOSIS — Q828 Other specified congenital malformations of skin: Secondary | ICD-10-CM | POA: Diagnosis not present

## 2014-06-09 DIAGNOSIS — R8290 Unspecified abnormal findings in urine: Secondary | ICD-10-CM

## 2014-06-09 DIAGNOSIS — R0602 Shortness of breath: Secondary | ICD-10-CM | POA: Diagnosis not present

## 2014-06-09 DIAGNOSIS — M79609 Pain in unspecified limb: Secondary | ICD-10-CM | POA: Diagnosis not present

## 2014-06-09 LAB — POCT URINALYSIS DIPSTICK
BILIRUBIN UA: NEGATIVE
Glucose, UA: NEGATIVE
Ketones, UA: NEGATIVE
Nitrite, UA: NEGATIVE
Protein, UA: NEGATIVE
RBC UA: NEGATIVE
SPEC GRAV UA: 1.01
Urobilinogen, UA: NEGATIVE
pH, UA: 7

## 2014-06-09 LAB — HM DIABETES FOOT EXAM

## 2014-06-09 NOTE — Telephone Encounter (Signed)
Spoke with [name], they stopped the Lasix. She has cardiology appointment Monday.

## 2014-06-09 NOTE — Telephone Encounter (Signed)
Call the group home and make sure she stop the Lasix, and verify the date of her cardiac appointment which should be sometime in the next 3-4 days.    Overnight oximetry was normal, no desaturation/low oxygen

## 2014-06-09 NOTE — Patient Instructions (Signed)
Diabetes and Foot Care Diabetes may cause you to have problems because of poor blood supply (circulation) to your feet and legs. This may cause the skin on your feet to become thinner, break easier, and heal more slowly. Your skin may become dry, and the skin may peel and crack. You may also have nerve damage in your legs and feet causing decreased feeling in them. You may not notice minor injuries to your feet that could lead to infections or more serious problems. Taking care of your feet is one of the most important things you can do for yourself.  HOME CARE INSTRUCTIONS  Wear shoes at all times, even in the house. Do not go barefoot. Bare feet are easily injured.  Check your feet daily for blisters, cuts, and redness. If you cannot see the bottom of your feet, use a mirror or ask someone for help.  Wash your feet with warm water (do not use hot water) and mild soap. Then pat your feet and the areas between your toes until they are completely dry. Do not soak your feet as this can dry your skin.  Apply a moisturizing lotion or petroleum jelly (that does not contain alcohol and is unscented) to the skin on your feet and to dry, brittle toenails. Do not apply lotion between your toes.  Trim your toenails straight across. Do not dig under them or around the cuticle. File the edges of your nails with an emery board or nail file.  Do not cut corns or calluses or try to remove them with medicine.  Wear clean socks or stockings every day. Make sure they are not too tight. Do not wear knee-high stockings since they may decrease blood flow to your legs.  Wear shoes that fit properly and have enough cushioning. To break in new shoes, wear them for just a few hours a day. This prevents you from injuring your feet. Always look in your shoes before you put them on to be sure there are no objects inside.  Do not cross your legs. This may decrease the blood flow to your feet.  If you find a minor scrape,  cut, or break in the skin on your feet, keep it and the skin around it clean and dry. These areas may be cleansed with mild soap and water. Do not cleanse the area with peroxide, alcohol, or iodine.  When you remove an adhesive bandage, be sure not to damage the skin around it.  If you have a wound, look at it several times a day to make sure it is healing.  Do not use heating pads or hot water bottles. They may burn your skin. If you have lost feeling in your feet or legs, you may not know it is happening until it is too late.  Make sure your health care provider performs a complete foot exam at least annually or more often if you have foot problems. Report any cuts, sores, or bruises to your health care provider immediately. SEEK MEDICAL CARE IF:   You have an injury that is not healing.  You have cuts or breaks in the skin.  You have an ingrown nail.  You notice redness on your legs or feet.  You feel burning or tingling in your legs or feet.  You have pain or cramps in your legs and feet.  Your legs or feet are numb.  Your feet always feel cold. SEEK IMMEDIATE MEDICAL CARE IF:   There is increasing redness,   swelling, or pain in or around a wound.  There is a red line that goes up your leg.  Pus is coming from a wound.  You develop a fever or as directed by your health care provider.  You notice a bad smell coming from an ulcer or wound. Document Released: 10/11/2000 Document Revised: 06/16/2013 Document Reviewed: 03/23/2013 Exit[employer] Patient Information 2015 [employer]. This information is not intended to replace advice given to you by your health care provider. Make sure you discuss any questions you have with your health care provider.  

## 2014-06-10 ENCOUNTER — Telehealth: Payer: Self-pay | Admitting: Medical

## 2014-06-10 NOTE — Telephone Encounter (Signed)
[name] called & states they are physically unable to get pt's weight, they went out and bought the largest scale they could but due to pt's large stature and large feet and unsteadiness they are unable to get an accurate weight.  She wanted to see if there is anything else she could watch for instead?  I asked her if she had any unusual swelling or any new problems and she said no.  She has appt on Monday with Cardiac

## 2014-06-10 NOTE — Progress Notes (Signed)
Subjective:     Patient ID: [name], female   DOB: [birth year], 79 y.o.   MRN: [id number]  HPI patient presents with nail disease of both feet and lesions on the fifth digit both feet that can become painful and she cannot take care of. States her nails also bother her and she does present with caregiver   Review of Systems     Objective:   Physical Exam Neurovascular status unchanged with patient being a long-term diabetic with keratotic lesions that are painful and nail disease that are thick yellow and painful when pressed    Assessment:     At risk diabetic with mycotic nail infection and lesions of both feet    Plan:     Debris painful nailbeds 1-5 both feet and lesions on both feet with no iatrogenic bleeding noted

## 2014-06-11 NOTE — Telephone Encounter (Signed)
Don't worry about weights then.  Have her f/u with cardiology Monday, make sure we have sent recent OV notes, labs, xray, most recent EKG
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
## 2014-06-13 ENCOUNTER — Ambulatory Visit (INDEPENDENT_AMBULATORY_CARE_PROVIDER_SITE_OTHER): Payer: Medicare Other | Admitting: Internal Medicine

## 2014-06-13 ENCOUNTER — Encounter: Payer: Self-pay | Admitting: Internal Medicine

## 2014-06-13 VITALS — BP 109/53 | HR 77 | Ht 72.0 in | Wt 203.0 lb

## 2014-06-13 DIAGNOSIS — R0602 Shortness of breath: Secondary | ICD-10-CM

## 2014-06-13 DIAGNOSIS — R7989 Other specified abnormal findings of blood chemistry: Secondary | ICD-10-CM

## 2014-06-13 DIAGNOSIS — R799 Abnormal finding of blood chemistry, unspecified: Secondary | ICD-10-CM | POA: Diagnosis not present

## 2014-06-13 LAB — BASIC METABOLIC PANEL
BUN: 27 mg/dL — AB (ref 6–23)
CHLORIDE: 99 meq/L (ref 96–112)
CO2: 30 meq/L (ref 19–32)
Calcium: 9.9 mg/dL (ref 8.4–10.5)
Creatinine, Ser: 1.9 mg/dL — ABNORMAL HIGH (ref 0.4–1.2)
GFR: 27.93 mL/min — ABNORMAL LOW (ref >60.00)
GLUCOSE: 171 mg/dL — AB (ref 70–99)
POTASSIUM: 4.8 meq/L (ref 3.5–5.1)
Sodium: 138 mEq/L (ref 135–145)

## 2014-06-13 LAB — BRAIN NATRIURETIC PEPTIDE: PRO B NATRI PEPTIDE: 107 pg/mL — AB (ref 0.0–100.0)

## 2014-06-13 NOTE — Telephone Encounter (Signed)
[name] was notified about not doing the weights. Also Cardiologist is in epic so they should have access to everything

## 2014-06-13 NOTE — Patient Instructions (Signed)
Your physician recommends that you continue on your current medications as directed. Please refer to the Current Medication list given to you today. Your physician has requested that you have an echocardiogram. Echocardiography is a painless test that uses sound waves to create images of your heart. It provides your doctor with information about the size and shape of your heart and how well your heart's chambers and valves are working. This procedure takes approximately one hour. There are no restrictions for this procedure.  Your physician recommends that you return for lab work today (BMET, BNP)

## 2014-06-15 ENCOUNTER — Other Ambulatory Visit: Payer: Self-pay

## 2014-06-16 ENCOUNTER — Other Ambulatory Visit: Payer: Self-pay | Admitting: Medical

## 2014-06-16 NOTE — Telephone Encounter (Signed)
Is this okay to refill? 

## 2014-06-17 ENCOUNTER — Ambulatory Visit (HOSPITAL_COMMUNITY): Payer: Medicare Other | Attending: Internal Medicine | Admitting: Radiology

## 2014-06-17 DIAGNOSIS — Z9289 Personal history of other medical treatment: Secondary | ICD-10-CM

## 2014-06-17 DIAGNOSIS — R0602 Shortness of breath: Secondary | ICD-10-CM

## 2014-06-17 DIAGNOSIS — R7989 Other specified abnormal findings of blood chemistry: Secondary | ICD-10-CM

## 2014-06-17 HISTORY — DX: Personal history of other medical treatment: Z92.89

## 2014-06-17 NOTE — Progress Notes (Signed)
Echocardiogram performed.  

## 2014-06-22 ENCOUNTER — Telehealth: Payer: Self-pay

## 2014-06-22 DIAGNOSIS — R0602 Shortness of breath: Secondary | ICD-10-CM

## 2014-06-22 NOTE — Telephone Encounter (Signed)
Dr Tenny Craw comments on Echo: Echo is normal LV systolic and diastolic function are normal Would recomm lexiscan myoview to make sure no blood supply problem can explain SOB   I contacted Synetta Fail Emergency planning/management officer) at group home and made her aware of results. I made her aware that a scheduler will be contacting the facility to schedule a lexiscan myoview.  The pt also had labs done on 06/14/14 and she will need a repeat BMP next week.  I have placed order for myoview and BMP.

## 2014-06-23 ENCOUNTER — Telehealth: Payer: Self-pay | Admitting: Internal Medicine

## 2014-06-23 NOTE — Telephone Encounter (Signed)
Spoke with Synetta Fail at the group home. Aware of results and that patient has myoview and labs scheduled for 9/4.

## 2014-06-23 NOTE — Telephone Encounter (Signed)
New message     Returning [name]'s call

## 2014-06-28 DIAGNOSIS — Z9289 Personal history of other medical treatment: Secondary | ICD-10-CM

## 2014-06-28 HISTORY — DX: Personal history of other medical treatment: Z92.89

## 2014-07-01 ENCOUNTER — Other Ambulatory Visit (INDEPENDENT_AMBULATORY_CARE_PROVIDER_SITE_OTHER): Payer: Medicare Other

## 2014-07-01 ENCOUNTER — Ambulatory Visit (HOSPITAL_COMMUNITY): Payer: Medicare Other | Attending: Cardiology | Admitting: Radiology

## 2014-07-01 VITALS — BP 144/65 | HR 64 | Ht 72.0 in | Wt 204.0 lb

## 2014-07-01 DIAGNOSIS — I10 Essential (primary) hypertension: Secondary | ICD-10-CM | POA: Insufficient documentation

## 2014-07-01 DIAGNOSIS — Z01810 Encounter for preprocedural cardiovascular examination: Secondary | ICD-10-CM | POA: Insufficient documentation

## 2014-07-01 DIAGNOSIS — I428 Other cardiomyopathies: Secondary | ICD-10-CM | POA: Insufficient documentation

## 2014-07-01 DIAGNOSIS — R5383 Other fatigue: Secondary | ICD-10-CM

## 2014-07-01 DIAGNOSIS — Z87891 Personal history of nicotine dependence: Secondary | ICD-10-CM | POA: Insufficient documentation

## 2014-07-01 DIAGNOSIS — R0602 Shortness of breath: Secondary | ICD-10-CM | POA: Diagnosis not present

## 2014-07-01 DIAGNOSIS — E119 Type 2 diabetes mellitus without complications: Secondary | ICD-10-CM | POA: Diagnosis not present

## 2014-07-01 DIAGNOSIS — R5381 Other malaise: Secondary | ICD-10-CM | POA: Diagnosis not present

## 2014-07-01 LAB — BASIC METABOLIC PANEL
BUN: 27 mg/dL — ABNORMAL HIGH (ref 6–23)
CALCIUM: 9.1 mg/dL (ref 8.4–10.5)
CO2: 31 meq/L (ref 19–32)
CREATININE: 1.5 mg/dL — AB (ref 0.4–1.2)
Chloride: 102 mEq/L (ref 96–112)
GFR: 35.25 mL/min — AB (ref >60.00)
GLUCOSE: 104 mg/dL — AB (ref 70–99)
Potassium: 4.7 mEq/L (ref 3.5–5.1)
Sodium: 137 mEq/L (ref 135–145)

## 2014-07-01 MED ORDER — TECHNETIUM TC 99M SESTAMIBI GENERIC - CARDIOLITE
33.0000 | Freq: Once | INTRAVENOUS | Status: AC | PRN
Start: 2014-07-01 — End: 2014-07-01
  Administered 2014-07-01: 33 via INTRAVENOUS

## 2014-07-01 MED ORDER — REGADENOSON 0.4 MG/5ML IV SOLN
0.4000 mg | Freq: Once | INTRAVENOUS | Status: AC
  Administered 2014-07-01: 0.4 mg via INTRAVENOUS

## 2014-07-01 MED ORDER — TECHNETIUM TC 99M SESTAMIBI GENERIC - CARDIOLITE
11.0000 | Freq: Once | INTRAVENOUS | Status: AC | PRN
  Administered 2014-07-01: 11 via INTRAVENOUS

## 2014-07-01 NOTE — Progress Notes (Signed)
[facility] SITE 3 NUCLEAR MED [address] [place], [place] [address] [phone]    Cardiology Nuclear Med Study  NYKISHA JAY is a 79 y.o. female     MRN : [id number]     DOB: 05-May-1935  Procedure Date: 07/01/2014  Nuclear Med Background Indication for Stress Test:  Evaluation for Ischemia History:  No known CAD, Asthma, COPD Cardiac Risk Factors: Hypertension and NIDDM  Symptoms:  Fatigue and SOB   Nuclear Pre-Procedure Caffeine/Decaff Intake:  None NPO After: 8:00pm   Lungs:  clear O2 Sat: 97% on room air. IV 0.9% NS with Angio Cath:  22g  IV Site: R Antecubital  IV Started by:  Bonnita Levan, RN  Chest Size (in):  40 Cup Size: C  Height: 6' (1.829 m)  Weight:  204 lb (92.534 kg)  BMI:  Body mass index is 27.66 kg/(m^2). Tech Comments:  N/A    Nuclear Med Study 1 or 2 day study: 1 day  Stress Test Type:  Lexiscan  Reading MD: N/A  Order Authorizing Provider:  Dietrich Pates, MD  Resting Radionuclide: Technetium 37m Sestamibi  Resting Radionuclide Dose: 11.0 mCi   Stress Radionuclide:  Technetium 30m Sestamibi  Stress Radionuclide Dose: 33.0 mCi           Stress Protocol Rest HR: 64 Stress HR: 83  Rest BP: 144/65 Stress BP: 103/49  Exercise Time (min): n/a METS: n/a           Dose of Adenosine (mg):  n/a Dose of Lexiscan: 0.4 mg  Dose of Atropine (mg): n/a Dose of Dobutamine: n/a mcg/kg/min (at max HR)  Stress Test Technologist: Nelson Chimes, BS-ES  Nuclear Technologist:  Doyne Keel, CNMT     Rest Procedure:  Myocardial perfusion imaging was performed at rest 45 minutes following the intravenous administration of Technetium 51m Sestamibi. Rest ECG: NSR - Normal EKG  Stress Procedure:  The patient received IV Lexiscan 0.4 mg over 15-seconds.  Technetium 72m Sestamibi injected at 30-seconds.  Quantitative spect images were obtained after a 45 minute delay. Stress ECG: No significant change from baseline ECG  QPS Raw Data Images:  Normal; no  motion artifact; normal heart/lung ratio. Stress Images:  Normal homogeneous uptake in all areas of the myocardium. Rest Images:  Normal homogeneous uptake in all areas of the myocardium. Subtraction (SDS):  Normal Transient Ischemic Dilatation (Normal <1.22):  0.95 Lung/Heart Ratio (Normal <0.45):  0.35  Quantitative Gated Spect Images QGS EDV:  78 ml QGS ESV:  23 ml  Impression Exercise Capacity:  Lexiscan with no exercise. BP Response:  Normal blood pressure response. Clinical Symptoms:  No significant symptoms noted. ECG Impression:  No significant ST segment change suggestive of ischemia. Comparison with Prior Nuclear Study: No images to compare  Overall Impression:  Normal stress nuclear study.  LV Ejection Fraction: 71%.  LV Wall Motion:  NL LV Function; NL Wall Motion   Charlton Haws

## 2014-07-05 ENCOUNTER — Telehealth: Payer: Self-pay | Admitting: *Deleted

## 2014-07-05 DIAGNOSIS — R0602 Shortness of breath: Secondary | ICD-10-CM

## 2014-07-05 NOTE — Telephone Encounter (Signed)
PFT order placed, advised [name] at facility and scheduled appointment for Thursday

## 2014-07-05 NOTE — Telephone Encounter (Signed)
Message copied by Burnell Blanks on Tue Jul 05, 2014  2:13 PM ------      Message from: Dietrich Pates V      Created: Mon Jul 04, 2014 10:07 AM       Stress test is normal  There does not appear to be a blood flow problem to explain symptoms.      Echo was normal with normal systolic and diastolic function.      SOB does not appear to be due to heart problem  Would sched PFTs if not done. ------

## 2014-07-05 NOTE — Telephone Encounter (Signed)
Message copied by Burnell Blanks on Tue Jul 05, 2014  2:12 PM ------      Message from: Dietrich Pates V      Created: Mon Jul 04, 2014 10:18 AM       Labs are rel stable .      I would set patient up to see me Thursday in clinic  OK to overbook  Need to review all the testing she has had      SO far no signif abnormalities found.  WOuld like to review with her. ------
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
## 2014-07-07 ENCOUNTER — Ambulatory Visit: Payer: Medicare Other | Admitting: Internal Medicine

## 2014-07-07 ENCOUNTER — Encounter: Payer: Self-pay | Admitting: Internal Medicine

## 2014-07-07 ENCOUNTER — Ambulatory Visit (INDEPENDENT_AMBULATORY_CARE_PROVIDER_SITE_OTHER): Payer: Medicare Other | Admitting: Internal Medicine

## 2014-07-07 VITALS — BP 116/62 | HR 68 | Ht 72.0 in | Wt 207.0 lb

## 2014-07-07 DIAGNOSIS — I10 Essential (primary) hypertension: Secondary | ICD-10-CM | POA: Diagnosis not present

## 2014-07-07 DIAGNOSIS — I421 Obstructive hypertrophic cardiomyopathy: Secondary | ICD-10-CM

## 2014-07-07 DIAGNOSIS — R0602 Shortness of breath: Secondary | ICD-10-CM | POA: Diagnosis not present

## 2014-07-07 LAB — BASIC METABOLIC PANEL
BUN: 31 mg/dL — AB (ref 6–23)
CO2: 31 mEq/L (ref 19–32)
CREATININE: 1.6 mg/dL — AB (ref 0.4–1.2)
Calcium: 9 mg/dL (ref 8.4–10.5)
Chloride: 100 mEq/L (ref 96–112)
GFR: 33.95 mL/min — AB (ref >60.00)
Glucose, Bld: 82 mg/dL (ref 70–99)
Potassium: 4.8 mEq/L (ref 3.5–5.1)
Sodium: 136 mEq/L (ref 135–145)

## 2014-07-07 LAB — BRAIN NATRIURETIC PEPTIDE: PRO B NATRI PEPTIDE: 112 pg/mL — AB (ref 0.0–100.0)

## 2014-07-07 NOTE — Patient Instructions (Signed)
Your physician recommends that you return for lab work today. Your physician recommends that you continue on your current medications as directed. Please refer to the Current Medication list given to you today. Your physician has recommended that you have a pulmonary function test. Pulmonary Function Tests are a group of tests that measure how well air moves in and out of your lungs.  Someone from our office will call to schedule this test for you.

## 2014-07-08 ENCOUNTER — Telehealth: Payer: Self-pay | Admitting: Medical

## 2014-07-08 ENCOUNTER — Other Ambulatory Visit: Payer: Self-pay | Admitting: Medical

## 2014-07-08 ENCOUNTER — Encounter: Payer: Self-pay | Admitting: Internal Medicine

## 2014-07-08 MED ORDER — HYDROCODONE-ACETAMINOPHEN 5-325 MG PO TABS: 1.0000 | ORAL_TABLET | Freq: Three times a day (TID) | ORAL | Status: DC | PRN

## 2014-07-08 NOTE — Telephone Encounter (Signed)
rx ready 

## 2014-07-08 NOTE — Telephone Encounter (Signed)
[name] was called and informed rx is ready.

## 2014-07-11 ENCOUNTER — Encounter: Payer: Self-pay | Admitting: Medical

## 2014-07-11 ENCOUNTER — Encounter (INDEPENDENT_AMBULATORY_CARE_PROVIDER_SITE_OTHER): Payer: Medicare Other

## 2014-07-11 DIAGNOSIS — R0602 Shortness of breath: Secondary | ICD-10-CM

## 2014-07-11 LAB — PULMONARY FUNCTION TEST
FEF 25-75 Pre: 0.47 L/sec
FEF2575-%Pred-Pre: 22 %
FEV1-%PRED-PRE: 30 %
FEV1-Pre: 0.88 L
FEV1FVC-%Pred-Pre: 76 %
FEV6-%Pred-Pre: 38 %
FEV6-PRE: 1.37 L
FEV6FVC-%Pred-Pre: 93 %
FVC-%Pred-Pre: 41 %
FVC-Pre: 1.55 L
PRE FEV1/FVC RATIO: 57 %
Pre FEV6/FVC Ratio: 89 %

## 2014-07-13 ENCOUNTER — Telehealth: Payer: Self-pay | Admitting: *Deleted

## 2014-07-13 NOTE — Telephone Encounter (Signed)
Vista Deck, director of group home where patient resides, is requesting that any new med orders be faxed to [phone] to her attention. She is also inquiring as to whether Dr Tenny Craw is putting her back on lasix. Please advise. Thanks, MI

## 2014-07-14 NOTE — Telephone Encounter (Signed)
Spoke to [employer]. Informed fax was sent 9/11.  Never received.  Refaxed this afternoon. Vista Deck states they are unable to monitor blood pressures at the group home, even with a automatic cuff. States she can take her to [facility] a couple times per week for BP checks.   Starting tomorrow they will begin holding valsartan. Will fax order for BMP to group home office to be done around 9/28. She will have it checked during one of the BP checks.  States they are beginning to look toward moving [name] to a nursing facility due to the amount of care that is required and they are unable to provide for her.

## 2014-07-21 ENCOUNTER — Other Ambulatory Visit: Payer: Self-pay

## 2014-07-21 ENCOUNTER — Other Ambulatory Visit: Payer: Medicare Other

## 2014-07-21 DIAGNOSIS — Z1231 Encounter for screening mammogram for malignant neoplasm of breast: Secondary | ICD-10-CM

## 2014-07-26 ENCOUNTER — Other Ambulatory Visit: Payer: Self-pay

## 2014-07-27 ENCOUNTER — Other Ambulatory Visit (INDEPENDENT_AMBULATORY_CARE_PROVIDER_SITE_OTHER): Payer: Medicare Other

## 2014-07-27 DIAGNOSIS — I421 Obstructive hypertrophic cardiomyopathy: Secondary | ICD-10-CM | POA: Diagnosis not present

## 2014-07-27 DIAGNOSIS — Z23 Encounter for immunization: Secondary | ICD-10-CM

## 2014-07-27 LAB — BASIC METABOLIC PANEL
BUN: 26 mg/dL — ABNORMAL HIGH (ref 6–23)
CO2: 28 meq/L (ref 19–32)
Calcium: 9.3 mg/dL (ref 8.4–10.5)
Chloride: 101 mEq/L (ref 96–112)
Creatinine, Ser: 1.5 mg/dL — ABNORMAL HIGH (ref 0.4–1.2)
GFR: 34.97 mL/min — ABNORMAL LOW (ref >60.00)
GLUCOSE: 96 mg/dL (ref 70–99)
Potassium: 4.9 mEq/L (ref 3.5–5.1)
SODIUM: 136 meq/L (ref 135–145)

## 2014-07-27 LAB — BRAIN NATRIURETIC PEPTIDE: Pro B Natriuretic peptide (BNP): 138 pg/mL — ABNORMAL HIGH (ref 0.0–100.0)

## 2014-07-29 ENCOUNTER — Other Ambulatory Visit: Payer: Self-pay | Admitting: Medical

## 2014-07-29 ENCOUNTER — Telehealth: Payer: Self-pay | Admitting: *Deleted

## 2014-07-29 MED ORDER — FUROSEMIDE 40 MG PO TABS: ORAL_TABLET | ORAL | Status: DC

## 2014-07-29 MED ORDER — POTASSIUM CHLORIDE CRYS ER 20 MEQ PO TBCR: EXTENDED_RELEASE_TABLET | ORAL | Status: DC

## 2014-07-29 NOTE — Telephone Encounter (Signed)
Message copied by Lendon Ka on Fri Jul 29, 2014  3:49 PM ------      Message from: Dietrich Pates V      Created: Wed Jul 27, 2014  9:59 PM       Please send Rx to [facility]      ----- Message -----         From: Judy Pimple, LPN         Sent: 07/27/2014  10:50 AM           To: Pricilla Riffle, MD, Lendon Ka, RN            Spoke with caretaker. Patient lives at [facility], so they need a written order re: Lasix and KCL. Fax to [phone]. They will call back to schedule a 3 mo followup appt/lr       ------

## 2014-07-29 NOTE — Telephone Encounter (Signed)
Scripts for lasix and KCL printed. Faxed to [employer]

## 2014-08-01 NOTE — Telephone Encounter (Signed)
Received list of BPs for pt since Valsartan has been on hold. Forwarded to Dr. Tenny Craw folder.

## 2014-08-04 ENCOUNTER — Ambulatory Visit (INDEPENDENT_AMBULATORY_CARE_PROVIDER_SITE_OTHER): Payer: Medicare Other | Admitting: Medical

## 2014-08-04 ENCOUNTER — Ambulatory Visit
Admission: RE | Admit: 2014-08-04 | Discharge: 2014-08-04 | Disposition: A | Discharge status: Home / Self Care | Payer: Medicare Other | Source: Ambulatory Visit

## 2014-08-04 ENCOUNTER — Encounter: Payer: Self-pay | Admitting: Medical

## 2014-08-04 ENCOUNTER — Telehealth: Payer: Self-pay | Admitting: Medical

## 2014-08-04 VITALS — BP 126/62 | HR 64 | Temp 98.3°F | Resp 15 | Wt 211.0 lb

## 2014-08-04 DIAGNOSIS — Z79899 Other long term (current) drug therapy: Secondary | ICD-10-CM | POA: Diagnosis not present

## 2014-08-04 DIAGNOSIS — Z1231 Encounter for screening mammogram for malignant neoplasm of breast: Secondary | ICD-10-CM

## 2014-08-04 DIAGNOSIS — Z9289 Personal history of other medical treatment: Secondary | ICD-10-CM

## 2014-08-04 DIAGNOSIS — F79 Unspecified intellectual disabilities: Secondary | ICD-10-CM | POA: Diagnosis not present

## 2014-08-04 DIAGNOSIS — R609 Edema, unspecified: Secondary | ICD-10-CM

## 2014-08-04 DIAGNOSIS — R0602 Shortness of breath: Secondary | ICD-10-CM

## 2014-08-04 DIAGNOSIS — I429 Cardiomyopathy, unspecified: Secondary | ICD-10-CM

## 2014-08-04 HISTORY — DX: Personal history of other medical treatment: Z92.89

## 2014-08-04 MED ORDER — ALBUTEROL SULFATE (2.5 MG/3ML) 0.083% IN NEBU: 2.5000 mg | INHALATION_SOLUTION | Freq: Four times a day (QID) | RESPIRATORY_TRACT | Status: DC | PRN

## 2014-08-04 MED ORDER — BUDESONIDE 0.5 MG/2ML IN SUSP: 0.5000 mg | Freq: Two times a day (BID) | RESPIRATORY_TRACT | Status: DC

## 2014-08-04 NOTE — Telephone Encounter (Signed)
Send copy of patient AV summery to them, read over the recommendations, and f/u within 1/82mo

## 2014-08-04 NOTE — Progress Notes (Signed)
Subjective: Here with [name] from the group home. They have questions mainly about her recent visit with cardiology, when necessary medications, and her ongoing complaints of shortness of breath. Recently things are fine, but they want to clarify her medications. Since last visit here she did see cardiology, they did not feel she had a current heart concerns such as CHF, and refer her to pulmonary function studies but that didn't go so well  ROS as in subjective  Outpatient Encounter Prescriptions as of 08/04/2014  Medication Sig Note  . ACCU-CHEK AVIVA PLUS test strip CHECK BLOOD GLUCOSE (SUGAR) 1 OR 2 TIMESDAILY 08/04/2014: ONly Checks Weekly  . albuterol (PROVENTIL) (2.5 MG/3ML) 0.083% nebulizer solution Take 3 mLs (2.5 mg total) by nebulization every 6 (six) hours as needed for wheezing or shortness of breath. 04/12/2014: Prn   . buPROPion (WELLBUTRIN SR) 150 MG 12 hr tablet Take 150 mg by mouth daily after breakfast.     . Calcium Carbonate-Vit D-Min (CALCIUM 1200) 1200-1000 MG-UNIT CHEW Chew 1,000 mg by mouth once.   . diclofenac (FLECTOR) 1.3 % PTCH Place 1 patch onto the skin 2 (two) times daily.   . divalproex (DEPAKOTE) 125 MG DR tablet Take 250 mg by mouth 2 (two) times daily.    Marland Kitchen. docusate sodium (COLACE) 100 MG capsule Take 100 mg by mouth 2 (two) times daily.     . ferrous sulfate 325 (65 FE) MG tablet Take 325 mg by mouth daily with breakfast.     . FLECTOR 1.3 % PTCH Place 1 patch onto the skin as needed.  05/20/2014: Received from: External Pharmacy  . FLUoxetine (PROZAC) 10 MG capsule Take 10 mg by mouth daily. 10mg  plus 40 mg am   . FLUoxetine (PROZAC) 40 MG capsule Take 40 mg by mouth daily.     . fluticasone (FLONASE) 50 MCG/ACT nasal spray TWO SPRAYS INTO NOSE DAILY   . furosemide (LASIX) 40 MG tablet Take once daily as needed for shortness of breath   . HYDROcodone-acetaminophen (NORCO/VICODIN) 5-325 MG per tablet Take 1 tablet by mouth every 8 (eight) hours as needed.    . ibandronate (BONIVA) 150 MG tablet ONE TABLET EVERY 30 DAYS. TAKE IN THE MORNING WITH A FULL GLASS OF WATER ON ANEMPTY STOMACH. DO NOT TAKE ANYTHING ELSEOR LIE DOWN   . isosorbide mononitrate (IMDUR) 30 MG 24 hr tablet TAKE ONE TABLET BY MOUTH ONCE DAILY   . LORazepam (ATIVAN) 0.5 MG tablet Take 0.5 mg by mouth 2 (two) times daily. And as needed   . metoCLOPramide (REGLAN) 5 MG tablet one tablet every other day   . metoprolol tartrate (LOPRESSOR) 25 MG tablet TAKE ONE TABLET TWICE DAILY   . montelukast (SINGULAIR) 10 MG tablet TAKE ONE TABLET EVERY NIGHT   . omeprazole (PRILOSEC) 20 MG capsule TAKE ONE CAPSULE EACH DAY   . potassium chloride SA (K-DUR,KLOR-CON) 20 MEQ tablet Take once daily only when taking furosemide   . PRODIGY TWIST TOP LANCETS 28G MISC TWICE DAILY   . QC LORATADINE ALLERGY RELIEF 10 MG tablet TAKE ONE TABLET EACH DAY   . rivaroxaban (XARELTO) 20 MG TABS tablet Take 1 tablet (20 mg total) by mouth daily with supper.   . simvastatin (ZOCOR) 20 MG tablet TAKE ONE TABLET EVERY DAY   . [DISCONTINUED] albuterol (PROVENTIL) (2.5 MG/3ML) 0.083% nebulizer solution 1 VIAL VIA NEBULIZER EVERY 6 HOURS AS NEEDED FOR WHEEZING OR SHORTNESS OF BREATH   . [DISCONTINUED] SYMBICORT 160-4.5 MCG/ACT inhaler INHALE 2 PUFFS  INTO THE LUNGS TWICE DAILY   . [DISCONTINUED] valsartan (DIOVAN) 80 MG tablet TAKE ONE TABLET BY MOUTH ONCE DAILY 08/04/2014: ON HOLD SINCE 9/18     Objedtive: Gen: wd, wn, nad lugns clear Heart RRR, normal S1, s2, no murmursw Ext: no edema Pulses normal   Assessment: Encounter Diagnoses  Name Primary?  . SOB (shortness of breath) Yes  . Edema   . Mental retardation   . Medication management    Plan: Of note, I have reviewed her recent cardiology notes that showed no specific cardiac problem causing her recent symptoms.  She was not able to do a proper PFT.  Thus this was canceled. For consistency and to make it easier on the group home staff, changes as below  today. Rest of her medications continue as usual. If worse shortness of breath or 5 pound more weight gain in one day then call imediatleoy.  F/u with call back, the in 5mo.  Patient Instructions  Regarding her as needed/prn medications:   Begin Lasix 40 mg, one half tablet daily in the morning for fluid retention. This is a diuretic and will help prevent fluid buildup in the lungs  Begin K-Dur/potassium 20 mEq, one half tablet daily along with Lasix  Stop handheld inhalers for Symbicort and albuterol  Begin Pulmicort nebulized medication twice daily for prevention and control of wheezing symptoms  Begin albuterol nebulized solution as needed every 6 hours for wheezing or shortness of breath  Continue her pain medication and pain patch as needed for worse pain  Recheck in one month

## 2014-08-04 NOTE — Patient Instructions (Signed)
Regarding her as needed/prn medications:   Begin Lasix 40 mg, one half tablet daily in the morning for fluid retention. This is a diuretic and will help prevent fluid buildup in the lungs  Begin K-Dur/potassium 20 mEq, one half tablet daily along with Lasix  Stop handheld inhalers for Symbicort and albuterol  Begin Pulmicort nebulized medication twice daily for prevention and control of wheezing symptoms  Begin albuterol nebulized solution as needed every 6 hours for wheezing or shortness of breath  Continue her pain medication and pain patch as needed for worse pain  Recheck in one month

## 2014-08-05 ENCOUNTER — Other Ambulatory Visit: Payer: Self-pay | Admitting: Medical

## 2014-08-05 NOTE — Telephone Encounter (Signed)
I mailed her AV summary to her caregiver

## 2014-08-09 ENCOUNTER — Telehealth: Payer: Self-pay | Admitting: Medical

## 2014-08-10 DIAGNOSIS — F330 Major depressive disorder, recurrent, mild: Secondary | ICD-10-CM | POA: Diagnosis not present

## 2014-08-10 DIAGNOSIS — F71 Moderate intellectual disabilities: Secondary | ICD-10-CM | POA: Diagnosis not present

## 2014-08-11 ENCOUNTER — Other Ambulatory Visit: Payer: Self-pay | Admitting: Medical

## 2014-08-11 MED ORDER — BUDESONIDE 0.5 MG/2ML IN SUSP: 0.5000 mg | Freq: Two times a day (BID) | RESPIRATORY_TRACT | Status: DC

## 2014-08-15 ENCOUNTER — Telehealth: Payer: Self-pay | Admitting: Medical

## 2014-08-15 NOTE — Telephone Encounter (Signed)
I called over to [facility] and advised Synetta Fail that it would be okay to get those labs done

## 2014-08-15 NOTE — Telephone Encounter (Signed)
Psychiatric NP wrote lab orders for pt to get CBC with diff, LFT, Depakote Level's. Can pt get this done here?

## 2014-08-15 NOTE — Telephone Encounter (Signed)
Yes, and have Karren Burly send last labs done to them.

## 2014-08-20 NOTE — Telephone Encounter (Signed)
Per pharmacist Onalee Hua, went thru ins

## 2014-08-22 ENCOUNTER — Other Ambulatory Visit: Payer: Self-pay | Admitting: Medical

## 2014-08-22 ENCOUNTER — Telehealth: Payer: Self-pay | Admitting: Medical

## 2014-08-22 DIAGNOSIS — R7989 Other specified abnormal findings of blood chemistry: Secondary | ICD-10-CM

## 2014-08-22 DIAGNOSIS — D508 Other iron deficiency anemias: Secondary | ICD-10-CM

## 2014-08-22 DIAGNOSIS — R799 Abnormal finding of blood chemistry, unspecified: Secondary | ICD-10-CM

## 2014-08-22 NOTE — Telephone Encounter (Signed)
[name] is aware at [facility] and she will come by next week

## 2014-08-22 NOTE — Telephone Encounter (Signed)
I have lab orders in for recheck on kidney function, blood counts.  Can come in at their convenience.

## 2014-08-29 ENCOUNTER — Other Ambulatory Visit: Payer: Self-pay | Admitting: Medical

## 2014-08-29 NOTE — Telephone Encounter (Signed)
Is this okay to refill? 

## 2014-08-30 ENCOUNTER — Other Ambulatory Visit: Payer: Medicare Other

## 2014-08-30 DIAGNOSIS — R799 Abnormal finding of blood chemistry, unspecified: Secondary | ICD-10-CM | POA: Diagnosis not present

## 2014-08-30 DIAGNOSIS — D508 Other iron deficiency anemias: Secondary | ICD-10-CM | POA: Diagnosis not present

## 2014-08-30 DIAGNOSIS — Z79899 Other long term (current) drug therapy: Secondary | ICD-10-CM | POA: Diagnosis not present

## 2014-08-30 DIAGNOSIS — R7989 Other specified abnormal findings of blood chemistry: Secondary | ICD-10-CM | POA: Diagnosis not present

## 2014-08-30 DIAGNOSIS — N289 Disorder of kidney and ureter, unspecified: Secondary | ICD-10-CM | POA: Diagnosis not present

## 2014-08-30 LAB — CBC WITH DIFFERENTIAL/PLATELET
BASOS ABS: 0 10*3/uL (ref 0.0–0.1)
Basophils Relative: 0 % (ref 0–1)
EOS PCT: 3 % (ref 0–5)
Eosinophils Absolute: 0.2 10*3/uL (ref 0.0–0.7)
HEMATOCRIT: 33.9 % — AB (ref 36.0–46.0)
Hemoglobin: 11.3 g/dL — ABNORMAL LOW (ref 12.0–15.0)
LYMPHS ABS: 0.9 10*3/uL (ref 0.7–4.0)
LYMPHS PCT: 15 % (ref 12–46)
MCH: 29.7 pg (ref 26.0–34.0)
MCHC: 33.3 g/dL (ref 30.0–36.0)
MCV: 89 fL (ref 78.0–100.0)
Monocytes Absolute: 0.6 10*3/uL (ref 0.1–1.0)
Monocytes Relative: 10 % (ref 3–12)
NEUTROS ABS: 4.5 10*3/uL (ref 1.7–7.7)
Neutrophils Relative %: 72 % (ref 43–77)
PLATELETS: 139 10*3/uL — AB (ref 150–400)
RBC: 3.81 MIL/uL — ABNORMAL LOW (ref 3.87–5.11)
RDW: 13.1 % (ref 11.5–15.5)
WBC: 6.2 10*3/uL (ref 4.0–10.5)

## 2014-08-31 LAB — HEPATIC FUNCTION PANEL
ALK PHOS: 70 U/L (ref 39–117)
AST: 13 U/L (ref 0–37)
Albumin: 3.6 g/dL (ref 3.5–5.2)
BILIRUBIN DIRECT: 0.1 mg/dL (ref 0.0–0.3)
BILIRUBIN INDIRECT: 0.2 mg/dL (ref 0.2–1.2)
BILIRUBIN TOTAL: 0.3 mg/dL (ref 0.2–1.2)
Total Protein: 6.5 g/dL (ref 6.0–8.3)

## 2014-08-31 LAB — BASIC METABOLIC PANEL
BUN: 32 mg/dL — ABNORMAL HIGH (ref 6–23)
CO2: 31 mEq/L (ref 19–32)
CREATININE: 1.61 mg/dL — AB (ref 0.50–1.10)
Calcium: 9.6 mg/dL (ref 8.4–10.5)
Chloride: 100 mEq/L (ref 96–112)
Glucose, Bld: 94 mg/dL (ref 70–99)
POTASSIUM: 4.7 meq/L (ref 3.5–5.3)
SODIUM: 139 meq/L (ref 135–145)

## 2014-08-31 LAB — VALPROIC ACID LEVEL: Valproic Acid Lvl: 24.8 ug/mL — ABNORMAL LOW (ref 50.0–100.0)

## 2014-09-08 ENCOUNTER — Telehealth: Payer: Self-pay | Admitting: Internal Medicine

## 2014-09-08 ENCOUNTER — Other Ambulatory Visit: Payer: Self-pay | Admitting: Medical

## 2014-09-08 ENCOUNTER — Encounter: Payer: Self-pay | Admitting: Medical

## 2014-09-08 ENCOUNTER — Ambulatory Visit (INDEPENDENT_AMBULATORY_CARE_PROVIDER_SITE_OTHER): Payer: Medicare Other | Admitting: Medical

## 2014-09-08 VITALS — BP 120/74 | HR 72 | Temp 97.9°F | Resp 16 | Ht 71.0 in | Wt 200.0 lb

## 2014-09-08 DIAGNOSIS — R208 Other disturbances of skin sensation: Secondary | ICD-10-CM

## 2014-09-08 DIAGNOSIS — F329 Major depressive disorder, single episode, unspecified: Secondary | ICD-10-CM

## 2014-09-08 DIAGNOSIS — Z9181 History of falling: Secondary | ICD-10-CM

## 2014-09-08 DIAGNOSIS — J4540 Moderate persistent asthma, uncomplicated: Secondary | ICD-10-CM

## 2014-09-08 DIAGNOSIS — Z86718 Personal history of other venous thrombosis and embolism: Secondary | ICD-10-CM

## 2014-09-08 DIAGNOSIS — F70 Mild intellectual disabilities: Secondary | ICD-10-CM

## 2014-09-08 DIAGNOSIS — R609 Edema, unspecified: Secondary | ICD-10-CM

## 2014-09-08 DIAGNOSIS — E119 Type 2 diabetes mellitus without complications: Secondary | ICD-10-CM | POA: Diagnosis not present

## 2014-09-08 DIAGNOSIS — M170 Bilateral primary osteoarthritis of knee: Secondary | ICD-10-CM | POA: Diagnosis not present

## 2014-09-08 DIAGNOSIS — I10 Essential (primary) hypertension: Secondary | ICD-10-CM | POA: Diagnosis not present

## 2014-09-08 DIAGNOSIS — I422 Other hypertrophic cardiomyopathy: Secondary | ICD-10-CM

## 2014-09-08 DIAGNOSIS — D638 Anemia in other chronic diseases classified elsewhere: Secondary | ICD-10-CM

## 2014-09-08 DIAGNOSIS — F32A Depression, unspecified: Secondary | ICD-10-CM

## 2014-09-08 DIAGNOSIS — Z0000 Encounter for general adult medical examination without abnormal findings: Secondary | ICD-10-CM

## 2014-09-08 DIAGNOSIS — R0602 Shortness of breath: Secondary | ICD-10-CM

## 2014-09-08 DIAGNOSIS — Z7901 Long term (current) use of anticoagulants: Secondary | ICD-10-CM

## 2014-09-08 DIAGNOSIS — Z79899 Other long term (current) drug therapy: Secondary | ICD-10-CM

## 2014-09-08 DIAGNOSIS — R200 Anesthesia of skin: Secondary | ICD-10-CM

## 2014-09-08 DIAGNOSIS — D696 Thrombocytopenia, unspecified: Secondary | ICD-10-CM | POA: Diagnosis not present

## 2014-09-08 DIAGNOSIS — F0630 Mood disorder due to known physiological condition, unspecified: Secondary | ICD-10-CM

## 2014-09-08 DIAGNOSIS — N189 Chronic kidney disease, unspecified: Secondary | ICD-10-CM

## 2014-09-08 DIAGNOSIS — H9193 Unspecified hearing loss, bilateral: Secondary | ICD-10-CM

## 2014-09-08 DIAGNOSIS — M8580 Other specified disorders of bone density and structure, unspecified site: Secondary | ICD-10-CM

## 2014-09-08 DIAGNOSIS — H6123 Impacted cerumen, bilateral: Secondary | ICD-10-CM

## 2014-09-08 LAB — RENAL FUNCTION PANEL
Albumin: 3.9 g/dL (ref 3.5–5.2)
BUN: 32 mg/dL — AB (ref 6–23)
CALCIUM: 9.3 mg/dL (ref 8.4–10.5)
CHLORIDE: 100 meq/L (ref 96–112)
CO2: 30 mEq/L (ref 19–32)
Creat: 1.53 mg/dL — ABNORMAL HIGH (ref 0.50–1.10)
Glucose, Bld: 101 mg/dL — ABNORMAL HIGH (ref 70–99)
PHOSPHORUS: 3 mg/dL (ref 2.3–4.6)
POTASSIUM: 4.5 meq/L (ref 3.5–5.3)
SODIUM: 138 meq/L (ref 135–145)

## 2014-09-08 LAB — MAGNESIUM: Magnesium: 2.1 mg/dL (ref 1.5–2.5)

## 2014-09-08 LAB — PHOSPHORUS: Phosphorus: 3 mg/dL (ref 2.3–4.6)

## 2014-09-08 LAB — LIPID PANEL
CHOL/HDL RATIO: 2.4 ratio
CHOLESTEROL: 131 mg/dL (ref 0–200)
HDL: 55 mg/dL (ref >39)
LDL CALC: 64 mg/dL (ref 0–99)
Triglycerides: 60 mg/dL (ref <150)
VLDL: 12 mg/dL (ref 0–40)

## 2014-09-08 NOTE — Telephone Encounter (Signed)
OK to RF

## 2014-09-08 NOTE — Telephone Encounter (Signed)
[name] called stating that she talked to Sharel sister in law and she does not remember anything about her having a hysterectomy.
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
## 2014-09-09 ENCOUNTER — Encounter: Payer: Self-pay | Admitting: Medical

## 2014-09-09 DIAGNOSIS — M8580 Other specified disorders of bone density and structure, unspecified site: Secondary | ICD-10-CM | POA: Insufficient documentation

## 2014-09-09 DIAGNOSIS — M170 Bilateral primary osteoarthritis of knee: Secondary | ICD-10-CM | POA: Insufficient documentation

## 2014-09-09 DIAGNOSIS — N189 Chronic kidney disease, unspecified: Secondary | ICD-10-CM | POA: Insufficient documentation

## 2014-09-09 DIAGNOSIS — Z79899 Other long term (current) drug therapy: Secondary | ICD-10-CM | POA: Insufficient documentation

## 2014-09-09 DIAGNOSIS — F32A Depression, unspecified: Secondary | ICD-10-CM | POA: Insufficient documentation

## 2014-09-09 DIAGNOSIS — Z9181 History of falling: Secondary | ICD-10-CM | POA: Insufficient documentation

## 2014-09-09 DIAGNOSIS — F329 Major depressive disorder, single episode, unspecified: Secondary | ICD-10-CM | POA: Insufficient documentation

## 2014-09-09 DIAGNOSIS — F0630 Mood disorder due to known physiological condition, unspecified: Secondary | ICD-10-CM | POA: Insufficient documentation

## 2014-09-09 DIAGNOSIS — E119 Type 2 diabetes mellitus without complications: Secondary | ICD-10-CM | POA: Insufficient documentation

## 2014-09-09 DIAGNOSIS — I10 Essential (primary) hypertension: Secondary | ICD-10-CM | POA: Insufficient documentation

## 2014-09-09 DIAGNOSIS — Z7901 Long term (current) use of anticoagulants: Secondary | ICD-10-CM | POA: Insufficient documentation

## 2014-09-09 DIAGNOSIS — J4540 Moderate persistent asthma, uncomplicated: Secondary | ICD-10-CM | POA: Insufficient documentation

## 2014-09-09 DIAGNOSIS — D638 Anemia in other chronic diseases classified elsewhere: Secondary | ICD-10-CM | POA: Insufficient documentation

## 2014-09-09 LAB — PTH, INTACT AND CALCIUM
Calcium: 9.3 mg/dL (ref 8.4–10.5)
PTH: 73 pg/mL — AB (ref 14–64)

## 2014-09-09 MED ORDER — METOCLOPRAMIDE HCL 5 MG PO TABS: 5.0000 mg | ORAL_TABLET | ORAL | Status: DC

## 2014-09-09 MED ORDER — MONTELUKAST SODIUM 10 MG PO TABS: 10.0000 mg | ORAL_TABLET | Freq: Every day | ORAL | Status: DC

## 2014-09-09 MED ORDER — METOPROLOL TARTRATE 25 MG PO TABS: 25.0000 mg | ORAL_TABLET | Freq: Two times a day (BID) | ORAL | Status: DC

## 2014-09-09 MED ORDER — SIMVASTATIN 20 MG PO TABS: 20.0000 mg | ORAL_TABLET | Freq: Every day | ORAL | Status: DC

## 2014-09-09 MED ORDER — FLUTICASONE PROPIONATE 50 MCG/ACT NA SUSP: 1.0000 | Freq: Every day | NASAL | Status: DC

## 2014-09-09 MED ORDER — IBANDRONATE SODIUM 150 MG PO TABS: 150.0000 mg | ORAL_TABLET | ORAL | Status: DC

## 2014-09-09 MED ORDER — ISOSORBIDE MONONITRATE ER 30 MG PO TB24: 30.0000 mg | ORAL_TABLET | Freq: Every day | ORAL | Status: DC

## 2014-09-09 MED ORDER — FERROUS SULFATE 325 (65 FE) MG PO TABS: 325.0000 mg | ORAL_TABLET | Freq: Every day | ORAL | Status: DC

## 2014-09-09 MED ORDER — ALBUTEROL SULFATE (2.5 MG/3ML) 0.083% IN NEBU: 2.5000 mg | INHALATION_SOLUTION | Freq: Four times a day (QID) | RESPIRATORY_TRACT | Status: DC | PRN

## 2014-09-09 MED ORDER — HYDROCODONE-ACETAMINOPHEN 5-325 MG PO TABS: 1.0000 | ORAL_TABLET | Freq: Three times a day (TID) | ORAL | Status: DC | PRN

## 2014-09-09 MED ORDER — FUROSEMIDE 40 MG PO TABS: 20.0000 mg | ORAL_TABLET | Freq: Every day | ORAL | Status: DC

## 2014-09-09 MED ORDER — PRODIGY TWIST TOP LANCETS 28G MISC: Status: DC

## 2014-09-09 MED ORDER — OMEPRAZOLE 20 MG PO CPDR: 20.0000 mg | DELAYED_RELEASE_CAPSULE | Freq: Every day | ORAL | Status: DC

## 2014-09-09 MED ORDER — BUDESONIDE 0.5 MG/2ML IN SUSP
0.5000 mg | Freq: Two times a day (BID) | RESPIRATORY_TRACT | Status: DC
Start: 2014-09-09 — End: 2014-11-10

## 2014-09-09 MED ORDER — RIVAROXABAN 20 MG PO TABS: 20.0000 mg | ORAL_TABLET | Freq: Every day | ORAL | Status: DC

## 2014-09-09 MED ORDER — LORATADINE 10 MG PO TABS: 10.0000 mg | ORAL_TABLET | Freq: Every day | ORAL | Status: DC

## 2014-09-09 MED ORDER — DOCUSATE SODIUM 100 MG PO CAPS: 100.0000 mg | ORAL_CAPSULE | Freq: Two times a day (BID) | ORAL | Status: DC

## 2014-09-09 MED ORDER — GLUCOSE BLOOD VI STRP: ORAL_STRIP | Status: DC

## 2014-09-09 MED ORDER — CALCIUM 1200 1200-1000 MG-UNIT PO CHEW: 1000.0000 mg | CHEWABLE_TABLET | Freq: Once | ORAL | Status: DC

## 2014-09-09 NOTE — Addendum Note (Signed)
Addended by: Jac Canavan on: 09/09/2014 08:43 PM   Modules accepted: Orders

## 2014-09-12 ENCOUNTER — Ambulatory Visit: Payer: Medicare Other | Admitting: Podiatry

## 2014-09-15 ENCOUNTER — Ambulatory Visit: Payer: Medicare Other | Admitting: Podiatry

## 2014-10-04 ENCOUNTER — Telehealth: Payer: Self-pay | Admitting: Medical

## 2014-10-04 NOTE — Telephone Encounter (Signed)
Spoke to [place] as ([name] had already left for the day) and told her shane's recommendations. They check her bp once daily and on Saturday it was 166/60 and did not know if that was of concern. She will let [name] know what shane's recommendations are and give [place] a call back with anymore questions or concerns tomorrow when she comes in

## 2014-10-04 NOTE — Telephone Encounter (Signed)
See msg

## 2014-10-04 NOTE — Telephone Encounter (Signed)
After looking back over her recent visit for physical, I know we spoke about several things of that visit, but I can't remember exactly what we had talked about in regards to blood pressure monitoring.  In general the goal would be to have blood pressure with a systolic of 130 or less, diastolic 80 or less.  If her blood pressures are running low such as 100/60 or lower than that would be too low.  Ideally 120-130/80 is about right.  So if she has been in this range then they can quit checking the blood pressures.   I don't recall telling them to stop or start any other blood pressure medicines.  I assume she is still taking metoprolol and Lasix.  Do they have any other new questions?

## 2014-10-04 NOTE — Telephone Encounter (Signed)
Caregiver monitoring blood pressure readings for pt as instructed but she wants to know what ranges are ok and what she should be looking out for as being dangerous levels

## 2014-10-05 ENCOUNTER — Telehealth: Payer: Self-pay | Admitting: Family Medicine

## 2014-10-05 NOTE — Telephone Encounter (Signed)
[name], called and wanted some clarification on how long do they have to continue to check the patients BP. She states that the patient's BP is running 113 to 125 for the top and below 80 on the bottom. She only had one elevated BP 170/72. I spoke with Kristian Covey [facility] about [name] wanting clarification if they have to continue this everyday or not. Shane T. States that he doesn't recall telling them to do that so he does apologize for the miscommunication. He told me to let them know that they don't have to continue checking the BP if her BP numbers are with in range. I told [name] that they could discontinue checking the BP all the time. [name] is aware.

## 2014-10-10 ENCOUNTER — Ambulatory Visit (INDEPENDENT_AMBULATORY_CARE_PROVIDER_SITE_OTHER): Payer: Medicare Other | Admitting: Podiatry

## 2014-10-10 ENCOUNTER — Encounter: Payer: Self-pay | Admitting: Podiatry

## 2014-10-10 ENCOUNTER — Ambulatory Visit: Payer: Medicare Other | Admitting: Podiatry

## 2014-10-10 DIAGNOSIS — E1151 Type 2 diabetes mellitus with diabetic peripheral angiopathy without gangrene: Secondary | ICD-10-CM | POA: Diagnosis not present

## 2014-10-10 DIAGNOSIS — Q828 Other specified congenital malformations of skin: Secondary | ICD-10-CM

## 2014-10-10 DIAGNOSIS — B351 Tinea unguium: Secondary | ICD-10-CM

## 2014-10-10 DIAGNOSIS — M79673 Pain in unspecified foot: Secondary | ICD-10-CM

## 2014-10-10 NOTE — Patient Instructions (Signed)
Diabetes and Foot Care Diabetes may cause you to have problems because of poor blood supply (circulation) to your feet and legs. This may cause the skin on your feet to become thinner, break easier, and heal more slowly. Your skin may become dry, and the skin may peel and crack. You may also have nerve damage in your legs and feet causing decreased feeling in them. You may not notice minor injuries to your feet that could lead to infections or more serious problems. Taking care of your feet is one of the most important things you can do for yourself.  HOME CARE INSTRUCTIONS  Wear shoes at all times, even in the house. Do not go barefoot. Bare feet are easily injured.  Check your feet daily for blisters, cuts, and redness. If you cannot see the bottom of your feet, use a mirror or ask someone for help.  Wash your feet with warm water (do not use hot water) and mild soap. Then pat your feet and the areas between your toes until they are completely dry. Do not soak your feet as this can dry your skin.  Apply a moisturizing lotion or petroleum jelly (that does not contain alcohol and is unscented) to the skin on your feet and to dry, brittle toenails. Do not apply lotion between your toes.  Trim your toenails straight across. Do not dig under them or around the cuticle. File the edges of your nails with an emery board or nail file.  Do not cut corns or calluses or try to remove them with medicine.  Wear clean socks or stockings every day. Make sure they are not too tight. Do not wear knee-high stockings since they may decrease blood flow to your legs.  Wear shoes that fit properly and have enough cushioning. To break in new shoes, wear them for just a few hours a day. This prevents you from injuring your feet. Always look in your shoes before you put them on to be sure there are no objects inside.  Do not cross your legs. This may decrease the blood flow to your feet.  If you find a minor scrape,  cut, or break in the skin on your feet, keep it and the skin around it clean and dry. These areas may be cleansed with mild soap and water. Do not cleanse the area with peroxide, alcohol, or iodine.  When you remove an adhesive bandage, be sure not to damage the skin around it.  If you have a wound, look at it several times a day to make sure it is healing.  Do not use heating pads or hot water bottles. They may burn your skin. If you have lost feeling in your feet or legs, you may not know it is happening until it is too late.  Make sure your health care provider performs a complete foot exam at least annually or more often if you have foot problems. Report any cuts, sores, or bruises to your health care provider immediately. SEEK MEDICAL CARE IF:   You have an injury that is not healing.  You have cuts or breaks in the skin.  You have an ingrown nail.  You notice redness on your legs or feet.  You feel burning or tingling in your legs or feet.  You have pain or cramps in your legs and feet.  Your legs or feet are numb.  Your feet always feel cold. SEEK IMMEDIATE MEDICAL CARE IF:   There is increasing redness,   swelling, or pain in or around a wound.  There is a red line that goes up your leg.  Pus is coming from a wound.  You develop a fever or as directed by your health care provider.  You notice a bad smell coming from an ulcer or wound. Document Released: 10/11/2000 Document Revised: 06/16/2013 Document Reviewed: 03/23/2013 Exit[employer] Patient Information 2015 [employer]. This information is not intended to replace advice given to you by your health care provider. Make sure you discuss any questions you have with your health care provider.  

## 2014-10-10 NOTE — Progress Notes (Signed)
Subjective:     Patient ID: [name], female   DOB: 01/04/1935, 79 y.o.   MRN: [id number]  HPI patient presents with nail disease of both feet and lesions on the fifth digit both feet that can become painful and she cannot take care of. States her nails also bother her and she does present with caregiver   Review of Systems     Objective:   Physical Exam Neurovascular status unchanged with patient being a long-term diabetic with keratotic lesions that are painful and nail disease that are thick yellow and painful when pressed    Assessment:     At risk diabetic with mycotic nail infection and lesions of both feet    Plan:     Debris painful nailbeds 1-5 both feet and lesions on both feet with no iatrogenic bleeding noted      

## 2014-11-03 ENCOUNTER — Encounter: Payer: Self-pay | Admitting: Medical

## 2014-11-03 ENCOUNTER — Ambulatory Visit (INDEPENDENT_AMBULATORY_CARE_PROVIDER_SITE_OTHER): Payer: Medicare Other | Admitting: Medical

## 2014-11-03 VITALS — BP 130/70 | HR 68 | Temp 97.8°F | Resp 21 | Wt 205.0 lb

## 2014-11-03 DIAGNOSIS — R05 Cough: Secondary | ICD-10-CM

## 2014-11-03 DIAGNOSIS — J449 Chronic obstructive pulmonary disease, unspecified: Secondary | ICD-10-CM | POA: Diagnosis not present

## 2014-11-03 DIAGNOSIS — R0989 Other specified symptoms and signs involving the circulatory and respiratory systems: Secondary | ICD-10-CM | POA: Diagnosis not present

## 2014-11-03 DIAGNOSIS — H9193 Unspecified hearing loss, bilateral: Secondary | ICD-10-CM

## 2014-11-03 DIAGNOSIS — H6123 Impacted cerumen, bilateral: Secondary | ICD-10-CM | POA: Diagnosis not present

## 2014-11-03 DIAGNOSIS — R059 Cough, unspecified: Secondary | ICD-10-CM

## 2014-11-03 NOTE — Progress Notes (Signed)
Subjective:  [name] is a 80 y.o. female who presents for decreased hearing, lots of ear wax, prior need to have wax removed.  By chance has 1 day hx/o nasal congestion, thick green mucous, cough, and wheezing.   Eating fine, not fatigued, hasn't really been complaining of cold symptoms until today.   The nebulized version of albuterol and pulmicort has been working better. Has had some difficulty with the face mask.  No other aggravating or relieving factors.  No other c/o.  The following portions of the patient's history were reviewed and updated as appropriate: allergies, current medications, past family history, past medical history, past social history, past surgical history and problem list.  ROS as in subjective  Past Medical History  Diagnosis Date  . PVD (peripheral vascular disease)   . Hypertension   . Diabetes mellitus   . GERD (gastroesophageal reflux disease)   . Hx of deep venous thrombosis     lifelong anticoagulant therapy  . Constipation     mild intermittent  . Chronic kidney disease   . Thrombocytopenia 10/2010    due to medications and immune dysregulation likely, stable as of 2015; no further investigation; hematology, Dr. Arline Asp  . Allergy   . Mild mental retardation   . Mood disorder     [facility] Lolita Cram  . [place]   . Diabetes type 2, controlled   . Long term current use of anticoagulant therapy     due to hx/o recurrent DVT  . Tremor 2015    consult with Dr. Mindi Slicker Neurology.  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 Depression     [facility] NP Lolita Cram  . Edentulous   . Osteoarthritis of both knees     Dr. Gean Birchwood  . Hypertrophic obstructive cardiomyopathy (HOCM) 2015    normal echo and stress test other than mild LVH 06/2014; Dr. Dietrich Pates  . Moderate persistent asthma     unable to do PFTs, 2015  . H/O echocardiogram 06/17/14    mild LVH, EF 60-65%, no wall motion abnormalities   . H/O cardiovascular stress test 06/2014    nuclear stress test normal; Dr. Huston Foley  . Shortness of breath     06/2014 cardiac consult, SOB seems to be related to poor technique with handheld inhaler, switched to nebs with much improvement  . Osteopenia 2013    improvements on Boniva and Ca+D from 2011-2013.  2013 Bone Density normal /improved; repeat Bone Density scan 2016  . Anemia of chronic disease 10/2010    stable as of 2015, on iron therapy for mild iron deficiency, chronic kidney disease, anemia of chronic disease; Dr. Arline Asp prior consult  . H/O mammogram 08/04/2014    normal  . Hyperlipidemia   . History of MRI of brain and brain stem 11/2010    normal MRI of brain     Objective: BP 130/70 mmHg  Pulse 68  Temp(Src) 97.8 F (36.6 C) (Oral)  Resp 21  Wt 205 lb (92.987 kg)  General appearance: Alert, WD/WN, no distress                             Skin: warm, no rash, no diaphoresis                           Head: no sinus tenderness  Eyes: conjunctiva normal, corneas clear, PERRLA                            Ears: impacted cerumen bilat                          Nose: septum midline, turbinates swollen, with erythema and mucoid discharge             Mouth/throat: MMM, tongue normal, mild pharyngeal erythema                           Neck: supple, no adenopathy, no thyromegaly, nontender                          Heart: RRR, normal S1, S2, no murmurs                         Lungs: decreased breath sounds in general, few scattered wheezes, no rales                Extremities: no edema, nontender     Assessment: Encounter Diagnoses  Name Primary?  . Hearing decreased, bilateral Yes  . Impacted cerumen of both ears   . Cough   . Abnormal lung sounds   . Chronic obstructive pulmonary disease, unspecified COPD, unspecified chronic bronchitis type     Plan:  Hearing decreased, impacted cerumen - Discussed findings.  Discussed risk/benefits of  procedure and patient agrees to procedure. Successfully used warm water lavage to remove impacted cerumen from bilat ear canal. Patient tolerated procedure well. Advised they avoid using any cotton swabs or other devices to clean the ear canals.  Use basic hygiene as discussed.  Follow up prn.   Cough, lung sounds abnormal, COPD - go for CXR, c/t pulmicort nebs BID, albuterol prn, mucinex, hydrate well.

## 2014-11-04 ENCOUNTER — Ambulatory Visit
Admission: RE | Admit: 2014-11-04 | Discharge: 2014-11-04 | Disposition: A | Discharge status: Home / Self Care | Payer: Medicare Other | Source: Ambulatory Visit | Attending: Medical | Admitting: Medical

## 2014-11-04 DIAGNOSIS — J984 Other disorders of lung: Secondary | ICD-10-CM | POA: Diagnosis not present

## 2014-11-04 DIAGNOSIS — R05 Cough: Secondary | ICD-10-CM

## 2014-11-04 DIAGNOSIS — J449 Chronic obstructive pulmonary disease, unspecified: Secondary | ICD-10-CM

## 2014-11-04 DIAGNOSIS — R0989 Other specified symptoms and signs involving the circulatory and respiratory systems: Secondary | ICD-10-CM

## 2014-11-04 DIAGNOSIS — R059 Cough, unspecified: Secondary | ICD-10-CM

## 2014-11-04 DIAGNOSIS — I517 Cardiomegaly: Secondary | ICD-10-CM | POA: Diagnosis not present

## 2014-11-07 ENCOUNTER — Other Ambulatory Visit: Payer: Self-pay | Admitting: Family Medicine

## 2014-11-07 DIAGNOSIS — Z719 Counseling, unspecified: Secondary | ICD-10-CM

## 2014-11-09 DIAGNOSIS — F329 Major depressive disorder, single episode, unspecified: Secondary | ICD-10-CM | POA: Diagnosis not present

## 2014-11-09 DIAGNOSIS — F71 Moderate intellectual disabilities: Secondary | ICD-10-CM | POA: Diagnosis not present

## 2014-11-10 ENCOUNTER — Other Ambulatory Visit: Payer: Self-pay | Admitting: Medical

## 2014-11-10 ENCOUNTER — Telehealth: Payer: Self-pay | Admitting: Internal Medicine

## 2014-11-10 MED ORDER — BUDESONIDE 0.5 MG/2ML IN SUSP: RESPIRATORY_TRACT | Status: DC
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
## 2014-11-10 NOTE — Telephone Encounter (Signed)
Pharmacy called and states that 65ml was sent in for pt but it is only done for 15 days instead of 30 and they needed it for 30 days so i have resent that in for 

## 2014-11-11 ENCOUNTER — Ambulatory Visit (INDEPENDENT_AMBULATORY_CARE_PROVIDER_SITE_OTHER): Payer: Medicare Other | Admitting: Internal Medicine

## 2014-11-11 ENCOUNTER — Encounter: Payer: Self-pay | Admitting: Internal Medicine

## 2014-11-11 VITALS — BP 110/60 | HR 72 | Ht 71.0 in | Wt 209.4 lb

## 2014-11-11 DIAGNOSIS — R0602 Shortness of breath: Secondary | ICD-10-CM

## 2014-11-11 MED ORDER — TIOTROPIUM BROMIDE MONOHYDRATE 18 MCG IN CAPS: ORAL_CAPSULE | RESPIRATORY_TRACT | Status: DC

## 2014-11-11 NOTE — Patient Instructions (Signed)
Your physician has recommended you make the following change in your medication: start using Spiriva 18 mcg (Take 2 puffs once daily)  Dr Tenny Craw is referring you to Pulmonology for your shortness of breath.

## 2014-11-23 DIAGNOSIS — F71 Moderate intellectual disabilities: Secondary | ICD-10-CM | POA: Diagnosis not present

## 2014-11-24 ENCOUNTER — Other Ambulatory Visit: Payer: Self-pay | Admitting: Medical

## 2014-11-24 MED ORDER — HYDROCODONE-ACETAMINOPHEN 5-325 MG PO TABS: 1.0000 | ORAL_TABLET | Freq: Three times a day (TID) | ORAL | Status: DC | PRN

## 2014-11-24 NOTE — Telephone Encounter (Signed)
Narcotic refill, please advise

## 2014-11-29 ENCOUNTER — Ambulatory Visit (INDEPENDENT_AMBULATORY_CARE_PROVIDER_SITE_OTHER): Payer: Medicare Other | Admitting: Pulmonary Disease

## 2014-11-29 ENCOUNTER — Encounter: Payer: Self-pay | Admitting: Pulmonary Disease

## 2014-11-29 VITALS — BP 90/58 | HR 72 | Ht 72.0 in | Wt 207.4 lb

## 2014-11-29 DIAGNOSIS — J479 Bronchiectasis, uncomplicated: Secondary | ICD-10-CM | POA: Diagnosis not present

## 2014-11-29 DIAGNOSIS — J4540 Moderate persistent asthma, uncomplicated: Secondary | ICD-10-CM

## 2014-11-29 NOTE — Patient Instructions (Signed)
You have scarring in your left lower lung OK to take budesonide twice daily - rinse mouth after Use albuterol nebs as needed only every 6h Call if worse
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
## 2014-11-29 NOTE — Assessment & Plan Note (Addendum)
It is unclear whether she has asthma-or COPD related to smoking. I doubt that we will ever be able to obtain pulmonary function testing to clarify this. Based on history, this appears to be more COPD. We were unable to obtain a good ambulatory oximetry today since she was tearful and had a lot of tremors. OK to take budesonide twice daily - rinse mouth after Use albuterol nebs as needed only every 6h Call if worse

## 2014-12-13 DIAGNOSIS — M170 Bilateral primary osteoarthritis of knee: Secondary | ICD-10-CM | POA: Diagnosis not present

## 2015-01-04 ENCOUNTER — Other Ambulatory Visit: Payer: Self-pay | Admitting: Family Medicine

## 2015-01-04 ENCOUNTER — Telehealth: Payer: Self-pay | Admitting: Internal Medicine

## 2015-01-04 MED ORDER — SIMVASTATIN 20 MG PO TABS: 20.0000 mg | ORAL_TABLET | Freq: Every day | ORAL | Status: DC

## 2015-01-04 NOTE — Telephone Encounter (Signed)
90 day supply of medication was sent to the patients pharmacy

## 2015-01-04 NOTE — Telephone Encounter (Signed)
Refill request for a 90 day supply for simvastatin 20mg  to [facility]

## 2015-01-05 ENCOUNTER — Telehealth: Payer: Self-pay | Admitting: Medical

## 2015-01-05 NOTE — Telephone Encounter (Signed)
[name] called and stated she got a message in pt's in box on my chart stating that she needs to have an A1C. According to her chart last one was in July. Please advise [name] on if she can just come in for labs or if she needs one at all. [name] can be reached at [phone]

## 2015-01-05 NOTE — Telephone Encounter (Signed)
Schedule a diabetes f/u appt

## 2015-01-09 ENCOUNTER — Encounter: Payer: Self-pay | Admitting: Podiatry

## 2015-01-09 ENCOUNTER — Ambulatory Visit (INDEPENDENT_AMBULATORY_CARE_PROVIDER_SITE_OTHER): Payer: Medicare Other | Admitting: Podiatry

## 2015-01-09 DIAGNOSIS — B351 Tinea unguium: Secondary | ICD-10-CM | POA: Diagnosis not present

## 2015-01-09 DIAGNOSIS — Q828 Other specified congenital malformations of skin: Secondary | ICD-10-CM

## 2015-01-09 DIAGNOSIS — E1151 Type 2 diabetes mellitus with diabetic peripheral angiopathy without gangrene: Secondary | ICD-10-CM | POA: Diagnosis not present

## 2015-01-09 DIAGNOSIS — M79673 Pain in unspecified foot: Secondary | ICD-10-CM | POA: Diagnosis not present

## 2015-01-09 NOTE — Progress Notes (Signed)
Subjective:     Patient ID: [name], female   DOB: 1935/04/11, 80 y.o.   MRN: [id number]  HPI patient presents with nail disease of both feet and lesions on the fifth digit both feet that can become painful and she cannot take care of. States her nails also bother her and she does present with caregiver   Review of Systems     Objective:   Physical Exam Neurovascular status unchanged with patient being a long-term diabetic with keratotic lesions that are painful and nail disease that are thick yellow and painful when pressed    Assessment:     At risk diabetic with mycotic nail infection and lesions of both feet    Plan:     Debris painful nailbeds 1-5 both feet and lesions on both feet with no iatrogenic bleeding noted

## 2015-01-09 NOTE — Progress Notes (Signed)
   Subjective:    Patient ID: [name], female    DOB: 06-20-1935, 80 y.o.   MRN: [id number]  HPI  debridement, 8 toes and B/L callouses  Review of Systems     Objective:   Physical Exam        Assessment & Plan:

## 2015-01-17 ENCOUNTER — Encounter: Payer: Self-pay | Admitting: Medical

## 2015-01-17 ENCOUNTER — Ambulatory Visit (INDEPENDENT_AMBULATORY_CARE_PROVIDER_SITE_OTHER): Payer: Medicare Other | Admitting: Medical

## 2015-01-17 VITALS — BP 110/60 | HR 68 | Temp 98.2°F | Wt 205.0 lb

## 2015-01-17 DIAGNOSIS — Z7901 Long term (current) use of anticoagulants: Secondary | ICD-10-CM | POA: Diagnosis not present

## 2015-01-17 DIAGNOSIS — J449 Chronic obstructive pulmonary disease, unspecified: Secondary | ICD-10-CM | POA: Diagnosis not present

## 2015-01-17 DIAGNOSIS — K219 Gastro-esophageal reflux disease without esophagitis: Secondary | ICD-10-CM | POA: Diagnosis not present

## 2015-01-17 DIAGNOSIS — E119 Type 2 diabetes mellitus without complications: Secondary | ICD-10-CM

## 2015-01-17 LAB — POCT GLYCOSYLATED HEMOGLOBIN (HGB A1C): HEMOGLOBIN A1C: 6.5

## 2015-01-17 NOTE — Progress Notes (Signed)
  Subjective:   [name] is an 79 y.o. female who presents for follow up of Type 2 diabetes mellitus.  Here with [name] Mahoney from Group Home.   Patient is checking home blood sugars.   Home blood sugar records: 95 to 112 Current symptoms include: none. Patient denies just eating a little less.  Patient is checking their feet daily. Foot concerns (callous, ulcer, wound, thickened nails, toenail fungus, skin fungus, hammer toe):  No concerns see a podiatry every 3 months Last dilated eye exam July  And November every year  Current treatments: none. Medication compliance: good  Current diet: well balanced Current exercise: none Known diabetic complications: none  Compliant with all other medication without c/o.   No other issues.  The following portions of the patient's history were reviewed and updated as appropriate: allergies, current medications, past family history, past medical history, past social history, past surgical history and problem list.  ROS as in subjective above    Objective:   BP 110/60 mmHg  Pulse 68  Temp(Src) 98.2 F (36.8 C) (Oral)  Wt 205 lb (92.987 kg)  Wt Readings from Last 3 Encounters:  01/17/15 205 lb (92.987 kg)  11/29/14 207 lb 6.4 oz (94.076 kg)  11/11/14 209 lb 6.4 oz (94.983 kg)  gen: wd, wn, nad Heart: rrr, normal s1, s2, no murmurs Lungs clear No edema Pulses normal    Assessment:   Encounter Diagnoses  Name Primary?  . Diabetes type 2, controlled Yes  . COPD mixed type   . Long-term (current) use of anticoagulants   . Gastroesophageal reflux disease without esophagitis      Plan:    Diabetes Mellitus type 2: HgbA1C 6.5% today.  Diet controled and has lost a little weight.  C/t consistent diet, daily foot checks, yearly eye exams all which is routine with her caregivers at the group home  COPD - much improved on Pulmicort neb BID.  reviewed cardiology and pulmonology notes from last few months.  C/t xaretlo, does fine  on this  GERD - cut back to Reglan 1 tablet q3 days, from 1 tablet every other day.   Will c/t gradually weaning off the medication.  F/u [phone] SchildeDebbe OdeKathleene HazFayrene FeariDrug Rehabilitation Incorporated - Day One ResidenceSt Joseph Center For [facility] LRinaldo ClouTilden FossaMarland KitcheMcarthur RossettRuthann CancExcell [phone] SchildeDebbe OdeKathleene HazFayrene FeariAscension Borgess HospitalSaint Thomas Rutherford HospitRinaldo ClouTilden FossaMarland KitcheMcarthur RossettRuthann CancExcell [phone] SchildeDebbe OdeKathleene HazFayrene FeariSaint Thomas Rutherford HospitalDecatur Morgan Hospital - Decatur CampRinaldo ClouTilden FossaMarland KitcheMcarthur RossettRuthann CancExcell [phone] SchildeDebbe OdeKathleene HazFayrene FeariBeltway Surgery Centers LLC Dba [facility] Surgery CenterNorth East Alliance Surgery CentRinaldo ClouTilden FossaMarland KitcheMcarthur RossettRuthann CancExcell [phone] SchildeDebbe OdeKathleene HazFayrene FeariMagnolia Surgery Center LLCMayo Clinic Hlth System- [facility] CRinaldo ClouTilden FossaMarland KitcheMcarthur RossettRuthann CancExcell [phone] SchildeDebbe OdeKathleene HazFayrene FeariBelton Regional Medical CenterAdventist Health And Rideout Memorial HospitRinaldo ClouTilden FossaMarland KitcheMcarthur RossettRuthann CancExcell [phone] SchildeDebbe OdeKathleene HazFayrene FeariEden Medical CenterHoly Redeemer Ambulatory Surgery Center LRinaldo ClouTilden FossaMarland KitcheMcarthur RossettRuthann CancExcell [phone] SchildeDebbe OdeKathleene HazFayrene FeariHarper County Community HospitalEnt Surgery Center Of [facility] LRinaldo ClouTilden FossaMarland KitcheMcarthur RossettRuthann CancExcell [phone] SchildeDebbe OdeKathleene HazFayrene FeariUh Health Shands Rehab HospitalNeuropsychiatric Hospital Of [place], LRinaldo ClouTilden FossaMarland KitcheMcarthur RossettRuthann CancExcell [phone] SchildeDebbe OdeKathleene HazFayrene FeariWalnut Creek Endoscopy Center LLCIron County HospitRinaldo ClouTilden FossaMarland KitcheMcarthur RossettRuthann CancExcell [phone] SchildeDebbe OdeKathleene HazFayrene FeariMethodist Jennie EdmundsonValley Behavioral Health SystRinaldo ClouTilden FossaMarland KitcheMcarthur RossettRuthann CancExcell [phone] SchildeDebbe OdeKathleene HazFayrene FeariYork General HospitalLake Bridge Behavioral Health SystRinaldo ClouTilden FossaMarland KitcheMcarthur RossettRuthann CancExcell [phone] SchildeDebbe OdeKathleene HazFayrene FeariEndoscopy Center Of Northwest ConnecticutNew Mexico Rehabilitation CentRinaldo ClouTilden FossaMarland KitcheMcarthur RossettRuthann CancExcell [phone] SchildeDebbe OdeKathleene HazFayrene FeariNorthern Louisiana Medical CenterVibra Hospital Of San DieRinaldo ClouTilden FossaMarland KitcheMcarthur RossettRuthann CancExcell [phone] SchildeDebbe OdeKathleene HazFayrene FeariDameron HospitalPlastic Surgical Center Of MississipRinaldo ClouTilden FossaMarland KitcheMcarthur RossettRuthann CancExcell [phone] SchildeDebbe OdeKathleene HazFayrene FeariAustin Eye Laser And SurgicenterCaromont Regional Medical CentRinaldo ClouTilden FossaMarland KitcheMcarthur RossettRuthann CancExcell [phone] SchildeDebbe OdeKathleene HazFayrene FeariOak Tree Surgical Center LLCMemphis Eye And Cataract Ambulatory Surgery CentRinaldo ClouTilden FossaMarland KitcheMcarthur RossettRuthann CancExcell [phone] SchildeDebbe OdeKathleene HazFayrene FeariJackson County Memorial HospitalAllegan General HospitRinaldo ClouTilden FossaMarland KitcheMcarthur RossettRuthann CancExcell [phone] SchildeDebbe OdeKathleene HazFayrene FeariSouthwestern Eye Center LtdMarian Behavioral Health CentRinaldo ClouTilden FossaMarland KitcheMcarthur RossettRuthann CancExcell [phone] SchildeDebbe OdeKathleene HazFayrene FeariOak Tree Surgical Center LLCRegional Health Spearfish HospitRinaldo ClouTilden FossaMarland KitcheMcarthur RossettRuthann CancExcell [phone] SchildeDebbe OdeKathleene HazFayrene FeariJames J. Peters Va Medical CenterPalmer Lutheran Health CentRinaldo ClouTilden FossaMarland KitcheMcarthur RossettRuthann CancExcell [phone] SchildeDebbe OdeKathleene HazFayrene FeariHeywood HospitalD. W. Mcmillan Memorial HospitRinaldo ClouTilden FossaMarland KitcheMcarthur RossettRuthann CancExcell [phone] SchildeDebbe OdeKathleene HazFayrene FeariFranklin Woods Community HospitalStonegate Surgery Center Rinaldo ClouTilden FossaMarland KitcheMcarthur RossettRuthann CancExcell [phone] SchildeDebbe OdeKathleene HazFayrene FeariNorthern Light A R Gould HospitalTaylor Station Surgical Center LRinaldo ClouTilden FossaMarland KitcheMcarthur RossettRuthann CancExcell Seltzer

## 2015-02-10 ENCOUNTER — Telehealth: Payer: Self-pay | Admitting: Internal Medicine

## 2015-02-10 NOTE — Telephone Encounter (Signed)
That is fine 

## 2015-02-10 NOTE — Telephone Encounter (Signed)
[name] called stating that [name] is on generic reglan every 3 day and is complaining of stomach pains and wants to know if she can go back on it every other day

## 2015-02-13 NOTE — Telephone Encounter (Signed)
pls write order and i'll sign

## 2015-02-13 NOTE — Telephone Encounter (Signed)
I spoke with [name] and told her that the orders are ready and she states to leave it up front and she will pick it up.

## 2015-02-13 NOTE — Telephone Encounter (Signed)
[name] called and states they tried to change the Relan to every 3 days, but it made [name]'s stomach hurt.  Can they go back to taking it every other day and if so they need a written order that she can pick up on Tuesday.

## 2015-02-21 ENCOUNTER — Encounter: Payer: Self-pay | Admitting: Medical

## 2015-02-21 ENCOUNTER — Ambulatory Visit (INDEPENDENT_AMBULATORY_CARE_PROVIDER_SITE_OTHER): Payer: Medicare Other | Admitting: Medical

## 2015-02-21 VITALS — BP 100/58 | HR 67 | Resp 14 | Wt 209.0 lb

## 2015-02-21 DIAGNOSIS — R1084 Generalized abdominal pain: Secondary | ICD-10-CM | POA: Diagnosis not present

## 2015-02-21 DIAGNOSIS — D539 Nutritional anemia, unspecified: Secondary | ICD-10-CM

## 2015-02-21 LAB — POCT URINALYSIS DIPSTICK
BILIRUBIN UA: NEGATIVE
Blood, UA: NEGATIVE
Glucose, UA: NEGATIVE
Ketones, UA: NEGATIVE
Nitrite, UA: NEGATIVE
Protein, UA: NEGATIVE
SPEC GRAV UA: 1.02
UROBILINOGEN UA: NEGATIVE
pH, UA: 6

## 2015-02-21 NOTE — Patient Instructions (Signed)
Recommendations:   Restart daily Reglan instead of every other day  For now continue iron  Collect 3 separate stool smears and return these so we can check for blood

## 2015-02-21 NOTE — Progress Notes (Signed)
Subjective: Here with [name] from group home.   She notes about a 1-2 wk hx/o generalized abdominal discomfit with no other symptoms.   We had recently titrated back up on Reglan since she had started to get symptoms trying to wean off.  She has been eating less vegetables of late.  She is still taking iron and colace daily without c/o.  No fever, no urinary c/o, no back pain, no URI c/o.  Has dark black stools, formed, has been this way since she has been taking iron.  Eating fine.  Been back on Reglan every other day denies vomiting or nausea.     [name] notes that that in years past, she always seems to have a lull in her mood and depression this time of year and close to mother's day, so they wonder if these symptoms are related to her cycle of down turn in mood in spring yearly.  ROS as in subjective   Objective: BP 100/58 mmHg  Pulse 67  Resp 14  Wt 209 lb (94.802 kg)  Wt Readings from Last 3 Encounters:  02/21/15 209 lb (94.802 kg)  01/17/15 205 lb (92.987 kg)  11/29/14 207 lb 6.4 oz (94.076 kg)   Gen: wd, wn, nad Lungs clear Abdomen: +increased bs throughout, tender mildly generalized, no mass, no organomegaly Back:nontender pulses normal Ext: no edema  Assessment: Encounter Diagnoses  Name Primary?  . Generalized abdominal pain Yes  . Deficiency anemia     Plan: restart Reglan daily instead of QOD.  Suspect diabetic gastroparesis.  Work on getting more vegetables as she has recently cut back on this.   C/t good water intake.   Stool cards x 3.  Consider recheck CBC soon and possibly stopping iron.  UA reviewed.  F/u pending stool cards or recheck sooner if worse abdominal pain.

## 2015-02-21 NOTE — Addendum Note (Signed)
Addended by: Janeice Robinson on: 02/21/2015 04:37 PM   Modules accepted: Orders

## 2015-03-06 ENCOUNTER — Other Ambulatory Visit (INDEPENDENT_AMBULATORY_CARE_PROVIDER_SITE_OTHER): Payer: Medicare Other

## 2015-03-06 DIAGNOSIS — D539 Nutritional anemia, unspecified: Secondary | ICD-10-CM

## 2015-03-06 LAB — POC HEMOCCULT BLD/STL (HOME/3-CARD/SCREEN)
Card #2 Fecal Occult Blod, POC: NEGATIVE
FECAL OCCULT BLD: NEGATIVE
Fecal Occult Blood, POC: NEGATIVE

## 2015-03-15 ENCOUNTER — Other Ambulatory Visit: Payer: Self-pay | Admitting: Medical

## 2015-03-15 ENCOUNTER — Ambulatory Visit (INDEPENDENT_AMBULATORY_CARE_PROVIDER_SITE_OTHER): Payer: Medicare Other | Admitting: Medical

## 2015-03-15 ENCOUNTER — Encounter: Payer: Self-pay | Admitting: Medical

## 2015-03-15 ENCOUNTER — Ambulatory Visit
Admission: RE | Admit: 2015-03-15 | Discharge: 2015-03-15 | Disposition: A | Discharge status: Home / Self Care | Payer: Medicare Other | Source: Ambulatory Visit | Attending: Medical | Admitting: Medical

## 2015-03-15 VITALS — BP 100/60 | HR 99 | Temp 98.5°F | Resp 14

## 2015-03-15 DIAGNOSIS — R630 Anorexia: Secondary | ICD-10-CM

## 2015-03-15 DIAGNOSIS — J4541 Moderate persistent asthma with (acute) exacerbation: Secondary | ICD-10-CM

## 2015-03-15 DIAGNOSIS — R062 Wheezing: Secondary | ICD-10-CM

## 2015-03-15 DIAGNOSIS — R531 Weakness: Secondary | ICD-10-CM

## 2015-03-15 DIAGNOSIS — M1712 Unilateral primary osteoarthritis, left knee: Secondary | ICD-10-CM | POA: Diagnosis not present

## 2015-03-15 DIAGNOSIS — R05 Cough: Secondary | ICD-10-CM | POA: Diagnosis not present

## 2015-03-15 DIAGNOSIS — R059 Cough, unspecified: Secondary | ICD-10-CM

## 2015-03-15 DIAGNOSIS — R5081 Fever presenting with conditions classified elsewhere: Secondary | ICD-10-CM

## 2015-03-15 DIAGNOSIS — M1711 Unilateral primary osteoarthritis, right knee: Secondary | ICD-10-CM | POA: Diagnosis not present

## 2015-03-15 DIAGNOSIS — J029 Acute pharyngitis, unspecified: Secondary | ICD-10-CM | POA: Diagnosis not present

## 2015-03-15 DIAGNOSIS — J471 Bronchiectasis with (acute) exacerbation: Secondary | ICD-10-CM | POA: Diagnosis not present

## 2015-03-15 LAB — CBC WITH DIFFERENTIAL/PLATELET
BASOS ABS: 0 10*3/uL (ref 0.0–0.1)
BASOS PCT: 0 % (ref 0–1)
EOS PCT: 0 % (ref 0–5)
Eosinophils Absolute: 0 10*3/uL (ref 0.0–0.7)
HCT: 31 % — ABNORMAL LOW (ref 36.0–46.0)
Hemoglobin: 10.5 g/dL — ABNORMAL LOW (ref 12.0–15.0)
Lymphocytes Relative: 6 % — ABNORMAL LOW (ref 12–46)
Lymphs Abs: 0.6 10*3/uL — ABNORMAL LOW (ref 0.7–4.0)
MCH: 30.1 pg (ref 26.0–34.0)
MCHC: 33.9 g/dL (ref 30.0–36.0)
MCV: 88.8 fL (ref 78.0–100.0)
MPV: 10.3 fL (ref 8.6–12.4)
Monocytes Absolute: 1.1 10*3/uL — ABNORMAL HIGH (ref 0.1–1.0)
Monocytes Relative: 12 % (ref 3–12)
Neutro Abs: 7.6 10*3/uL (ref 1.7–7.7)
Neutrophils Relative %: 82 % — ABNORMAL HIGH (ref 43–77)
PLATELETS: 180 10*3/uL (ref 150–400)
RBC: 3.49 MIL/uL — AB (ref 3.87–5.11)
RDW: 12.5 % (ref 11.5–15.5)
WBC: 9.3 10*3/uL (ref 4.0–10.5)

## 2015-03-15 LAB — BRAIN NATRIURETIC PEPTIDE: Brain Natriuretic Peptide: 116.5 pg/mL — ABNORMAL HIGH (ref 0.0–100.0)

## 2015-03-15 LAB — BASIC METABOLIC PANEL
BUN: 37 mg/dL — AB (ref 6–23)
CALCIUM: 8.7 mg/dL (ref 8.4–10.5)
CO2: 28 mEq/L (ref 19–32)
CREATININE: 1.52 mg/dL — AB (ref 0.50–1.10)
Chloride: 99 mEq/L (ref 96–112)
GLUCOSE: 201 mg/dL — AB (ref 70–99)
Potassium: 4.5 mEq/L (ref 3.5–5.3)
Sodium: 135 mEq/L (ref 135–145)

## 2015-03-15 MED ORDER — PREDNISONE 10 MG PO TABS: ORAL_TABLET | ORAL | Status: DC

## 2015-03-15 MED ORDER — AMOXICILLIN-POT CLAVULANATE 875-125 MG PO TABS: 1.0000 | ORAL_TABLET | Freq: Two times a day (BID) | ORAL | Status: DC

## 2015-03-15 NOTE — Progress Notes (Signed)
Subjective: Here for not feeling well.  Here with [name] from the group home.    This past Friday started having more cough, not feeling well, lethargic, continued over weekend .  Last few days bad coughing fits, seemed to be choking on mucous.  sounding awful with cough, lethargic, not eating.  Hydration has been less than desired.  Still doing budesonide nebs BID.  While [name] was gone over the weekend, she wasn't using the neb albuterol.  Just started albuterol nebs yesterday which helped some.  Coughed al night.   Weak, not getting up to use the bathroom on her own.   Just had cortisone shots in both knees with ortho today.   Been having knee pain in general as well.No other reported symptoms.  No other aggravating or relieving factors. No other complaint.  ROS as in subjective  Past Medical History  Diagnosis Date  . PVD (peripheral vascular disease)   . Hypertension   . Diabetes mellitus   . GERD (gastroesophageal reflux disease)   . Hx of deep venous thrombosis     lifelong anticoagulant therapy  . Constipation     mild intermittent  . Chronic kidney disease   . Thrombocytopenia 10/2010    due to medications and immune dysregulation likely, stable as of 2015; no further investigation; hematology, Dr. Arline Asp  . Allergy   . Mild mental retardation   . Mood disorder     [facility] NP Lolita Cram  . Falls   . Diabetes type 2, controlled   . Long term current use of anticoagulant therapy     due to hx/o recurrent DVT  . Tremor 2015    consult with Dr. Mindi Slicker Neurology.  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 Depression     [facility] NP Lolita Cram  . Edentulous   . Osteoarthritis of both knees     Dr. Gean Birchwood  . Hypertrophic obstructive cardiomyopathy (HOCM) 2015    normal echo and stress test other than mild LVH 06/2014; Dr. Dietrich Pates  . H/O echocardiogram 06/17/14    mild LVH, EF 60-65%, no wall motion abnormalities   . H/O cardiovascular stress test 06/2014    nuclear stress test normal; Dr. Huston Foley  . Shortness of breath     06/2014 cardiac consult, SOB seems to be related to poor technique with handheld inhaler, switched to nebs with much improvement  . Osteopenia 2013    improvements on Boniva and Ca+D from 2011-2013.  2013 Bone Density normal /improved; repeat Bone Density scan 2016  . Anemia of chronic disease 10/2010    stable as of 2015, on iron therapy for mild iron deficiency, chronic kidney disease, anemia of chronic disease; Dr. Arline Asp prior consult  . H/O mammogram 08/04/2014    normal  . Hyperlipidemia   . History of MRI of brain and brain stem 11/2010    normal MRI of brain  . Moderate persistent asthma     asthma and bronchiectasis, pulm consult 2015; unable to do PFTs, 2015   History   Social History  . Marital Status: Single    Spouse Name: N/A  . Number of Children: 0  . Years of Education: Elem   Occupational History  .  Other   Social History Main Topics  . Smoking status: Former Smoker    Types: Cigarettes    Quit date: 10/29/1999  . Smokeless tobacco: Never Used  . Alcohol Use: No  .  Drug Use: No  . Sexual Activity: Not on file     Comment: lives in [facility]   Other Topics Concern  . 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  Exercises with chair exercise. Has nearby brother and sister in law.  No other family remaining.   Caffeine Use: 1-2 servings occasionally.  Prior has volunteered at [employer] and [employer].     Objective: BP 100/60 mmHg  Pulse 99  Temp(Src) 98.5 F (36.9 C) (Oral)  Resp 14  SpO2 90%  General appearance: alert, no distress, WD/WN, ill appearing, lethargic, weak appearing compared to her norm HEENT: normocephalic, sclerae anicteric, TMs pearly, nares patent, no discharge or erythema, pharynx with mild erythema Oral cavity: MMM, no  lesions Neck: supple, no lymphadenopathy, no thyromegaly, no masses Heart: RRR, normal S1, S2, no murmurs Lungs:crackles bilat lower fields and right left mid lung fields, faint  Wheezes Abdomen: +bs, soft, non tender, non distended, no masses, no hepatomegaly, no splenomegaly Pulses: 2+ symmetric, upper and lower extremities, normal cap refill Ext: no edema    Assessment: Encounter Diagnoses  Name Primary?  . Cough Yes  . Weakness   . Sore throat   . Anorexia   . Wheezing   . Asthma with acute exacerbation, moderate persistent   . Bronchiectasis with acute exacerbation     Plan: Flu test negative.  STAT labs and will send for CXR.  Pneumonia or exacerbation of asthma/bronchiectasis suspected.    F/u pending studies.

## 2015-03-16 ENCOUNTER — Other Ambulatory Visit: Payer: Self-pay | Admitting: Medical

## 2015-03-16 LAB — POCT URINALYSIS DIPSTICK
Bilirubin, UA: NEGATIVE
Blood, UA: NEGATIVE
KETONES UA: NEGATIVE
Leukocytes, UA: NEGATIVE
Nitrite, UA: POSITIVE
PH UA: 6
PROTEIN UA: NEGATIVE
Spec Grav, UA: 1.015
UROBILINOGEN UA: NEGATIVE

## 2015-03-16 MED ORDER — PREDNISONE 10 MG PO TABS: ORAL_TABLET | ORAL | Status: DC

## 2015-03-16 MED ORDER — GLIPIZIDE 5 MG PO TABS
5.0000 mg | ORAL_TABLET | Freq: Every day | ORAL | Status: DC
Start: 2015-03-16 — End: 2015-07-11

## 2015-03-16 MED ORDER — AMOXICILLIN-POT CLAVULANATE 875-125 MG PO TABS: 1.0000 | ORAL_TABLET | Freq: Two times a day (BID) | ORAL | Status: DC

## 2015-03-16 MED ORDER — GLIPIZIDE 5 MG PO TABS: 5.0000 mg | ORAL_TABLET | Freq: Every day | ORAL | Status: DC

## 2015-03-16 NOTE — Patient Instructions (Signed)
Diagnosis: COPD exacerbation/flare up  Recommendations:  Rest  Continue plenty of fluid intake  Food as tolerated until feeling better, but make sure she is staying hydrated  Begin Augmentin Antibiotic twice daily x 10 days  Begin Prednisone steroid taper.  6 pills in the morning today, 5 pills in the morning tomorrow, 4 pills in the morning on Saturday, 3 pills in the morning on Sunday, 2 pills in the morning on Monday, and 1 pill in the morning on Monday  Continue her Pulmicort nebulized treatment twice daily every day as usual.   Rinse mouth out with water after each use.  She needs to use the Albuterol nebulized treatment every 6 hours for the next week, and then as needed after that up to every 6 hours.  Don't be afraid to use the albuterol!  This is for wheezing, shortness of breath, bad cough spells, and difficulty breathing.  This should be used when she complains of those symptoms in general.  Albuterol is called an Emergency or Rescue treatment and should always be available for her.   For the next 10 days ONLY, use Glipizide 5 mg, 1 tablet daily.  Use this only for 10 days given the fact that her sugars are higher temporarily due to steroid use.  If she is not much improved by Monday, or if much worse over the weekend, then call or take her to the emergency department.  If she coughs up blood or runs fever over 101, then I would take her to the emergency department.  Follow up in 10 days here.

## 2015-03-16 NOTE — Addendum Note (Signed)
Addended by: Lilli Light on: 03/16/2015 05:08 PM   Modules accepted: Orders

## 2015-03-17 ENCOUNTER — Telehealth: Payer: Self-pay | Admitting: Internal Medicine

## 2015-03-17 LAB — POC INFLUENZA A&B (BINAX/QUICKVUE)
INFLUENZA B, POC: NEGATIVE
Influenza A, POC: NEGATIVE

## 2015-03-17 NOTE — Telephone Encounter (Signed)
[name] called stating that her blood sugar was 290 and wanted to know if she needed to check it twice a day and how high should the blood sugars go before she should be concerned   Per shane- Ask if she is taking glipizide 5mg --(which she is taking) If blood sugar stays under 300 for the next 10 days then it should be ok If blood sugar goes over 300 then he wants her to come in to, possibly start on insulin.   [name] is aware of shane recommendations and will call or bring her in if any concerns

## 2015-03-17 NOTE — Addendum Note (Signed)
Addended by: Janeice Robinson on: 03/17/2015 08:35 AM   Modules accepted: Orders

## 2015-03-22 ENCOUNTER — Telehealth: Payer: Self-pay | Admitting: Family Medicine

## 2015-03-22 NOTE — Telephone Encounter (Signed)
[name] called and said that she was getting a message to look on the patients MYCHART so [name] look and she said it look like Tamesa may have a UTI. She wants to know if they need to be doing anything else for the patient? Not sure where she got that Deazia had a UTI.

## 2015-03-22 NOTE — Telephone Encounter (Signed)
I spoke with [name] and informed her of Kristian Covey PA message. I ask if the patient was having any Sx. And she states no. I advised them to continue all medications as directed per Crosby Oyster PA

## 2015-03-22 NOTE — Telephone Encounter (Signed)
I'm not sure about the message.  Is she improved from where I saw her recently?    Is she complaining of UTI symptoms?

## 2015-03-29 ENCOUNTER — Other Ambulatory Visit: Payer: Self-pay | Admitting: Medical

## 2015-04-24 ENCOUNTER — Other Ambulatory Visit: Payer: Self-pay | Admitting: Medical

## 2015-05-02 ENCOUNTER — Encounter: Payer: Self-pay | Admitting: Internal Medicine

## 2015-05-02 DIAGNOSIS — Z961 Presence of intraocular lens: Secondary | ICD-10-CM | POA: Diagnosis not present

## 2015-05-02 DIAGNOSIS — E119 Type 2 diabetes mellitus without complications: Secondary | ICD-10-CM | POA: Diagnosis not present

## 2015-05-02 DIAGNOSIS — H26491 Other secondary cataract, right eye: Secondary | ICD-10-CM | POA: Diagnosis not present

## 2015-05-02 DIAGNOSIS — H04123 Dry eye syndrome of bilateral lacrimal glands: Secondary | ICD-10-CM | POA: Diagnosis not present

## 2015-05-02 LAB — HM DIABETES EYE EXAM

## 2015-05-08 ENCOUNTER — Ambulatory Visit: Payer: Medicare Other

## 2015-05-11 ENCOUNTER — Encounter: Payer: Self-pay | Admitting: Podiatry

## 2015-05-11 ENCOUNTER — Ambulatory Visit (INDEPENDENT_AMBULATORY_CARE_PROVIDER_SITE_OTHER): Payer: Medicare Other | Admitting: Podiatry

## 2015-05-11 DIAGNOSIS — M79673 Pain in unspecified foot: Secondary | ICD-10-CM

## 2015-05-11 DIAGNOSIS — B351 Tinea unguium: Secondary | ICD-10-CM

## 2015-05-11 DIAGNOSIS — Q828 Other specified congenital malformations of skin: Secondary | ICD-10-CM

## 2015-05-12 NOTE — Progress Notes (Signed)
Subjective:     Patient ID: [name], female   DOB: Mar 01, 1935, 80 y.o.   MRN: [id number]  HPIThis patient returns for continued diabetic care for her nails and calluses.  She has been diagnosed with diabetes.  She says her nails and calluses are painful walking and wearing her shoes.  She presents for evaluation and treatment.   Review of Systems     Objective:   Physical Exam Objective: Review of past medical history, medications, social history and allergies were performed.  Vascular: Dorsalis pedis and posterior tibial pulses were palpable B/L, capillary refill was  WNL B/L, temperature gradient was WNL B/L   Skin:  Porokeratosis both forefeet.  Nails: Thick disfigured and discolored painful nails both feet.  Sensory: Phoebe Perch monifilament WNL   Orthopedic: Orthopedic evaluation demonstrates all joints distal t ankle have full ROM without crepitus, muscle power WNL B/L     Assessment:    Onychomycosis   Porokeratosis     Plan:     Debricdement of Nails.  Debridement of porokeratosis B/L

## 2015-05-16 DIAGNOSIS — F329 Major depressive disorder, single episode, unspecified: Secondary | ICD-10-CM | POA: Diagnosis not present

## 2015-05-19 ENCOUNTER — Telehealth: Payer: Self-pay | Admitting: Medical

## 2015-05-19 NOTE — Telephone Encounter (Signed)
[name] called and stated that Para March needs labs drawn for her psych doc. They needs A1C, Lipids, CBC, Depakote levels, LFT's and Ammonia levels. She is already on the scheduled for Aug 2. [name] states she is really wanted to see you, so If you could stick your head in while she is here she would greatly appreciate it. Please advise if ok to draw.

## 2015-05-22 NOTE — Telephone Encounter (Signed)
That is ok, and someone please make note to remind me that day when she comes in

## 2015-05-24 ENCOUNTER — Other Ambulatory Visit: Payer: Self-pay | Admitting: Medical

## 2015-05-29 ENCOUNTER — Telehealth: Payer: Self-pay | Admitting: Medical

## 2015-05-29 NOTE — Telephone Encounter (Signed)
Requesting refill on Hydrocodone for knee pain until specialist does something different. [name] would lie to pick this up on Thursday when they come in the office

## 2015-05-30 ENCOUNTER — Encounter: Payer: Self-pay | Admitting: Medical

## 2015-05-30 ENCOUNTER — Other Ambulatory Visit: Payer: Self-pay | Admitting: Family Medicine

## 2015-05-30 ENCOUNTER — Ambulatory Visit (INDEPENDENT_AMBULATORY_CARE_PROVIDER_SITE_OTHER): Payer: Medicare Other | Admitting: Medical

## 2015-05-30 ENCOUNTER — Ambulatory Visit
Admission: RE | Admit: 2015-05-30 | Discharge: 2015-05-30 | Disposition: A | Discharge status: Home / Self Care | Payer: Medicare Other | Source: Ambulatory Visit | Attending: Medical | Admitting: Medical

## 2015-05-30 VITALS — BP 110/60 | HR 61 | Temp 98.2°F | Wt 209.0 lb

## 2015-05-30 DIAGNOSIS — M79601 Pain in right arm: Secondary | ICD-10-CM

## 2015-05-30 DIAGNOSIS — S40021A Contusion of right upper arm, initial encounter: Secondary | ICD-10-CM

## 2015-05-30 DIAGNOSIS — M25569 Pain in unspecified knee: Secondary | ICD-10-CM | POA: Diagnosis not present

## 2015-05-30 DIAGNOSIS — R2681 Unsteadiness on feet: Secondary | ICD-10-CM | POA: Diagnosis not present

## 2015-05-30 DIAGNOSIS — G8929 Other chronic pain: Secondary | ICD-10-CM

## 2015-05-30 DIAGNOSIS — G2401 Drug induced subacute dyskinesia: Secondary | ICD-10-CM

## 2015-05-30 DIAGNOSIS — M79631 Pain in right forearm: Secondary | ICD-10-CM | POA: Diagnosis not present

## 2015-05-30 DIAGNOSIS — W1809XA Striking against other object with subsequent fall, initial encounter: Secondary | ICD-10-CM

## 2015-05-30 DIAGNOSIS — Z7901 Long term (current) use of anticoagulants: Secondary | ICD-10-CM | POA: Diagnosis not present

## 2015-05-30 DIAGNOSIS — M79604 Pain in right leg: Secondary | ICD-10-CM

## 2015-05-30 DIAGNOSIS — S59911A Unspecified injury of right forearm, initial encounter: Secondary | ICD-10-CM | POA: Diagnosis not present

## 2015-05-30 DIAGNOSIS — Z79899 Other long term (current) drug therapy: Secondary | ICD-10-CM

## 2015-05-30 DIAGNOSIS — S4991XA Unspecified injury of right shoulder and upper arm, initial encounter: Secondary | ICD-10-CM | POA: Diagnosis not present

## 2015-05-30 MED ORDER — HYDROCODONE-ACETAMINOPHEN 10-325 MG PO TABS: 1.0000 | ORAL_TABLET | Freq: Two times a day (BID) | ORAL | Status: DC

## 2015-05-30 NOTE — Progress Notes (Signed)
Subjective: Here today with [name] from group home.  05/26/15 Friday night fell against her bookshelf. She has continue to be more unsteady on feet in recent months, still has bad tremor, psychiatry thinks its tardive dyskinesia, she has seen neuro for this prior as well.   She was unsteady Friday, fell against her bookshelf with right arm then slid down onto her butt.   Since then has mild discomfort of buttocks, but a good amount of pain of right arm, thin skin, abrasion of right upper arm, and bruising lower right arm.  Denies head injury, LOC, no other pain.   Arm has good ROM without pain.   brushing looks worse this morning per [name].   She is due back Thursday for labs per psychiatry.   No other recent changes in weakness, no headaches, no confusion.  No other aggravating or relieving factors. No other complaint.  Past Medical History  Diagnosis Date  . PVD (peripheral vascular disease)   . Hypertension   . Diabetes mellitus   . GERD (gastroesophageal reflux disease)   . Hx of deep venous thrombosis     lifelong anticoagulant therapy  . Constipation     mild intermittent  . Chronic kidney disease   . Thrombocytopenia 10/2010    due to medications and immune dysregulation likely, stable as of 2015; no further investigation; hematology, Dr. Arline Asp  . Allergy   . Mild mental retardation   . Mood disorder     [facility] Care - NP Lolita Cram  . Falls   . Diabetes type 2, controlled   . Long term current use of anticoagulant therapy     due to hx/o recurrent DVT  . Tremor 2015    consult with Dr. Mindi Slicker Neurology.  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 Depression     [facility] Lolita Cram  . Edentulous   . Osteoarthritis of both knees     Dr. Gean Birchwood  . Hypertrophic obstructive cardiomyopathy (HOCM) 2015    normal echo and stress test other than mild LVH 06/2014; Dr. Dietrich Pates  . H/O echocardiogram 06/17/14    mild LVH, EF  60-65%, no wall motion abnormalities  . H/O cardiovascular stress test 06/2014    nuclear stress test normal; Dr. Huston Foley  . Shortness of breath     06/2014 cardiac consult, SOB seems to be related to poor technique with handheld inhaler, switched to nebs with much improvement  . Osteopenia 2013    improvements on Boniva and Ca+D from 2011-2013.  2013 Bone Density normal /improved; repeat Bone Density scan 2016  . Anemia of chronic disease 10/2010    stable as of 2015, on iron therapy for mild iron deficiency, chronic kidney disease, anemia of chronic disease; Dr. Arline Asp prior consult  . H/O mammogram 08/04/2014    normal  . Hyperlipidemia   . History of MRI of brain and brain stem 11/2010    normal MRI of brain  . Moderate persistent asthma     asthma and bronchiectasis, pulm consult 2015; unable to do PFTs, 2015   ROS as in subjective    Objective: BP 110/60 mmHg  Pulse 61  Temp(Src) 98.2 F (36.8 C) (Oral)  Wt 209 lb (94.802 kg)  Gen: wd, wn, nad, answers questions appropriately Right upper arm laterally with 4cm x 1cm abrasion with congealed superficial blood, there is a large area or purplish bruising to most of right upper arm laterally  and posteriorly from proximal 1/3 to elbow but not including elbow.  There is a yellowish area of bruising at the direct impact approx 4cm diameter of right upper lateral arm mid shaft.  There is a purplish 4cm x 3cm contusion of right forearm distal 1/3 posteriorly.   No bruising of buttock Slightly tender of buttocks Tender over right upper arm throughout, but normal shoulder wrist and elbow ROM, no deformity otherwise Arms and legs neurovascularly intact Gait - a little cautious obvious resting tremor of right arm   Assessment: Encounter Diagnoses  Name Primary?  . Arm pain, diffuse, right Yes  . Contusion of arm, right, initial encounter   . Fall against object, initial encounter   . Chronic knee pain, unspecified laterality      Plan: Arm pain, conduction, fall - will send for xrays of right arm.    Advised that the bleeding and contusion is normal, will takes weeks if not months to fully resolve, particularly being on Xarelto.   Advised to watch for signs of infection or severe pain that would prompt recheck.  discussed protecting the arm, using elevation of the arm, relative rest.  Begin using cane for stability.  C/t dressing changes to right upper arm abrasion, neosporin OTC.     Chanted pain medication today to give better pain control on a consistent basis.   She may ended up getting another knee injection by ortho in a months time.    F/u pending xray.

## 2015-05-31 ENCOUNTER — Ambulatory Visit (INDEPENDENT_AMBULATORY_CARE_PROVIDER_SITE_OTHER): Payer: Medicare Other | Admitting: Adult Health

## 2015-05-31 ENCOUNTER — Encounter: Payer: Self-pay | Admitting: Adult Health

## 2015-05-31 VITALS — BP 102/62 | HR 60 | Temp 98.6°F | Ht 72.0 in | Wt 208.0 lb

## 2015-05-31 DIAGNOSIS — J309 Allergic rhinitis, unspecified: Secondary | ICD-10-CM | POA: Insufficient documentation

## 2015-05-31 DIAGNOSIS — J4540 Moderate persistent asthma, uncomplicated: Secondary | ICD-10-CM

## 2015-05-31 NOTE — Progress Notes (Signed)
   Subjective:    Patient ID: [name], female    DOB: August 16, 1935, 80 y.o.   MRN: [id number]  HPI  PCP - Marissa Nestle Tysinger  80 year old ex-smoker, presents for evaluation of dyspnea on exertion. She has mild mental retardation, and since the death of her mother in 02/26/2009 lives in a group home. She is accompanied by the director of the group home who provides the history today  She smoked about 30 pack years before she quit in Feb 27, 2000. Review of imaging, including CT chest in 11/2006 suggest bronchiectasis left lower lobe. Chest x-ray 10/2014 suggests bilateral lower lobe scarring. She had difficulty using inhalers, and is now maintained on a regimen of budesonide and albuterol-which has worked much better for her. PFTs  - 06/2014 - not interpretable due to poor effort, could not be coached.  She does have visible dyspnea on exertion She underwent cardiac evaluation which was normal. She does have mood disorder. There is no history of frequent chest colds, her immunizations are up-to-date   05/31/2015 6 month follow up : Asthma -moderate persistent Pt returns with caregiver for 6 month follow up  She has asthma. Remains on pulmicort nebs and singular.  Breathing has been doing ok.  Has some nasal congestion and drainage in throat , takes claritin which helps.  No albuterol use.  No ER/Hospital stays.  Denies chest pain, orthopnea, edema or fever.     Review of Systems  Constitutional:   No  weight loss, night sweats,  Fevers, chills, fatigue, or  lassitude.  HEENT:   No headaches,  Difficulty swallowing,  Tooth/dental problems, or  Sore throat,                No sneezing, itching, ear ache,  +nasal congestion, post nasal drip,   CV:  No chest pain,  Orthopnea, PND, swelling in lower extremities, anasarca, dizziness, palpitations, syncope.   GI  No heartburn, indigestion, abdominal pain, nausea, vomiting, diarrhea, change in bowel habits, loss of appetite, bloody stools.   Resp:  No shortness of breath with exertion or at rest.  No excess mucus, no productive cough,  No non-productive cough,  No coughing up of blood.  No change in color of mucus.  No wheezing.  No chest wall deformity  Skin: no rash or lesions.  GU: no dysuria, change in color of urine, no urgency or frequency.  No flank pain, no hematuria   MS:  No joint pain or swelling.  No decreased range of motion.  No back pain.  Psych:  Hx of mood disorder, parkinsonian syndrome           Objective:   Physical Exam  Gen. Pleasant, well-nourished, in no distress, normal affect ENT - no lesions, no post nasal drip Neck: No JVD, no thyromegaly, no carotid bruits Lungs: no use of accessory muscles, no dullness to percussion, clear without rales or rhonchi  Cardiovascular: Rhythm regular, heart sounds  normal, no murmurs or gallops, no peripheral edema Abdomen: soft and non-tender, no hepatosplenomegaly, BS normal. Musculoskeletal: No deformities, no cyanosis or clubbing Neuro:  alert, non focal       Assessment & Plan:

## 2015-05-31 NOTE — Assessment & Plan Note (Signed)
Controlled on current regimen.   

## 2015-05-31 NOTE — Assessment & Plan Note (Signed)
Compensated without flare  Cont on current regimen  F/u in 6 months with Dr. Vassie Loll

## 2015-05-31 NOTE — Patient Instructions (Signed)
Continue on current regimen  follow up Dr. Alva  In 6 months and As needed    

## 2015-06-01 ENCOUNTER — Other Ambulatory Visit: Payer: Medicare Other

## 2015-06-01 DIAGNOSIS — Z79899 Other long term (current) drug therapy: Secondary | ICD-10-CM | POA: Diagnosis not present

## 2015-06-01 LAB — CBC WITH DIFFERENTIAL/PLATELET
BASOS PCT: 0 % (ref 0–1)
Basophils Absolute: 0 10*3/uL (ref 0.0–0.1)
EOS ABS: 0.2 10*3/uL (ref 0.0–0.7)
Eosinophils Relative: 3 % (ref 0–5)
HEMATOCRIT: 36.1 % (ref 36.0–46.0)
Hemoglobin: 11.7 g/dL — ABNORMAL LOW (ref 12.0–15.0)
LYMPHS PCT: 16 % (ref 12–46)
Lymphs Abs: 1 10*3/uL (ref 0.7–4.0)
MCH: 29 pg (ref 26.0–34.0)
MCHC: 32.4 g/dL (ref 30.0–36.0)
MCV: 89.4 fL (ref 78.0–100.0)
MPV: 10.5 fL (ref 8.6–12.4)
Monocytes Absolute: 0.6 10*3/uL (ref 0.1–1.0)
Monocytes Relative: 10 % (ref 3–12)
NEUTROS ABS: 4.5 10*3/uL (ref 1.7–7.7)
Neutrophils Relative %: 71 % (ref 43–77)
Platelets: 131 10*3/uL — ABNORMAL LOW (ref 150–400)
RBC: 4.04 MIL/uL (ref 3.87–5.11)
RDW: 13.2 % (ref 11.5–15.5)
WBC: 6.4 10*3/uL (ref 4.0–10.5)

## 2015-06-01 LAB — HEMOGLOBIN A1C
HEMOGLOBIN A1C: 6.7 % — AB (ref <5.7)
Mean Plasma Glucose: 146 mg/dL — ABNORMAL HIGH (ref <117)

## 2015-06-02 LAB — LIPID PANEL
Cholesterol: 133 mg/dL (ref 125–200)
HDL: 59 mg/dL (ref >=46)
LDL Cholesterol: 60 mg/dL (ref <130)
Total CHOL/HDL Ratio: 2.3 Ratio (ref <=5.0)
Triglycerides: 69 mg/dL (ref <150)
VLDL: 14 mg/dL (ref <30)

## 2015-06-02 LAB — AMMONIA: Ammonia: 28 umol/L (ref 16–53)

## 2015-06-02 LAB — HEPATIC FUNCTION PANEL
ALT: 8 U/L (ref 6–29)
AST: 15 U/L (ref 10–35)
Albumin: 3.8 g/dL (ref 3.6–5.1)
Alkaline Phosphatase: 73 U/L (ref 33–130)
BILIRUBIN INDIRECT: 0.3 mg/dL (ref 0.2–1.2)
BILIRUBIN TOTAL: 0.4 mg/dL (ref 0.2–1.2)
Bilirubin, Direct: 0.1 mg/dL (ref <=0.2)
Total Protein: 6.4 g/dL (ref 6.1–8.1)

## 2015-06-02 LAB — VALPROIC ACID LEVEL: Valproic Acid Lvl: 41 ug/mL — ABNORMAL LOW (ref 50.0–100.0)

## 2015-06-02 NOTE — Progress Notes (Signed)
Reviewed & agree with plan  

## 2015-06-08 ENCOUNTER — Other Ambulatory Visit: Payer: Self-pay | Admitting: Medical

## 2015-06-15 ENCOUNTER — Telehealth: Payer: Self-pay

## 2015-06-15 NOTE — Telephone Encounter (Signed)
Order for simvastatin to be given q hs. Left for [name] to pick up at the window.

## 2015-06-15 NOTE — Telephone Encounter (Signed)
Opened in error

## 2015-06-15 NOTE — Telephone Encounter (Signed)
Case worker, [name] called requesting your rec regarding her simvastatin and time of her dosing. In the past it has been given q hs, recent rx states q 6pm..? Which is necessary? Will need order if you would like to have it given at hs, please and thank you

## 2015-06-15 NOTE — Telephone Encounter (Signed)
It should be taken in the evening.  If they are giving it after dinner or at bedtime then that is fine, particularly whatever works with their schedule there.  Ideally this is at bedtime, but if they need it done after dinner or a specific time such as 6pm or 8pm, then wrist whatever order works for them at the group home.

## 2015-06-21 DIAGNOSIS — M170 Bilateral primary osteoarthritis of knee: Secondary | ICD-10-CM | POA: Diagnosis not present

## 2015-06-27 ENCOUNTER — Telehealth: Payer: Self-pay | Admitting: Medical

## 2015-06-27 NOTE — Telephone Encounter (Signed)
Requesting refill on Hydrocodone 10-325mg . Call [name] @ [phone] when script is ready for pick up

## 2015-06-28 ENCOUNTER — Other Ambulatory Visit: Payer: Self-pay | Admitting: Medical

## 2015-06-28 MED ORDER — HYDROCODONE-ACETAMINOPHEN 10-325 MG PO TABS: 1.0000 | ORAL_TABLET | Freq: Two times a day (BID) | ORAL | Status: DC

## 2015-06-28 NOTE — Telephone Encounter (Signed)
rx ready x 3 months (post dated scripts)

## 2015-06-28 NOTE — Telephone Encounter (Signed)
[name] came & picked up #3 Rx

## 2015-07-11 ENCOUNTER — Encounter: Payer: Self-pay | Admitting: Medical

## 2015-07-11 ENCOUNTER — Ambulatory Visit (INDEPENDENT_AMBULATORY_CARE_PROVIDER_SITE_OTHER): Payer: Medicare Other | Admitting: Medical

## 2015-07-11 ENCOUNTER — Ambulatory Visit
Admission: RE | Admit: 2015-07-11 | Discharge: 2015-07-11 | Disposition: A | Discharge status: Home / Self Care | Payer: Medicare Other | Source: Ambulatory Visit | Attending: Medical | Admitting: Medical

## 2015-07-11 ENCOUNTER — Telehealth: Payer: Self-pay | Admitting: Medical

## 2015-07-11 VITALS — BP 110/60 | HR 60 | Temp 97.8°F | Wt 206.0 lb

## 2015-07-11 DIAGNOSIS — G894 Chronic pain syndrome: Secondary | ICD-10-CM | POA: Diagnosis not present

## 2015-07-11 DIAGNOSIS — F329 Major depressive disorder, single episode, unspecified: Secondary | ICD-10-CM | POA: Diagnosis not present

## 2015-07-11 DIAGNOSIS — M19041 Primary osteoarthritis, right hand: Secondary | ICD-10-CM | POA: Diagnosis not present

## 2015-07-11 DIAGNOSIS — M79609 Pain in unspecified limb: Secondary | ICD-10-CM | POA: Diagnosis not present

## 2015-07-11 DIAGNOSIS — R296 Repeated falls: Secondary | ICD-10-CM

## 2015-07-11 DIAGNOSIS — Z79899 Other long term (current) drug therapy: Secondary | ICD-10-CM

## 2015-07-11 DIAGNOSIS — M79644 Pain in right finger(s): Secondary | ICD-10-CM

## 2015-07-11 DIAGNOSIS — F0630 Mood disorder due to known physiological condition, unspecified: Secondary | ICD-10-CM | POA: Diagnosis not present

## 2015-07-11 DIAGNOSIS — F32A Depression, unspecified: Secondary | ICD-10-CM

## 2015-07-11 DIAGNOSIS — J4540 Moderate persistent asthma, uncomplicated: Secondary | ICD-10-CM | POA: Insufficient documentation

## 2015-07-11 DIAGNOSIS — M79641 Pain in right hand: Secondary | ICD-10-CM | POA: Diagnosis not present

## 2015-07-11 DIAGNOSIS — Z9181 History of falling: Secondary | ICD-10-CM | POA: Diagnosis not present

## 2015-07-11 LAB — COMPREHENSIVE METABOLIC PANEL
ALBUMIN: 3.5 g/dL — AB (ref 3.6–5.1)
ALK PHOS: 72 U/L (ref 33–130)
ALT: 12 U/L (ref 6–29)
AST: 18 U/L (ref 10–35)
BUN: 35 mg/dL — ABNORMAL HIGH (ref 7–25)
CALCIUM: 8.8 mg/dL (ref 8.6–10.4)
CO2: 31 mmol/L (ref 20–31)
Chloride: 100 mmol/L (ref 98–110)
Creat: 1.48 mg/dL — ABNORMAL HIGH (ref 0.60–0.93)
GLUCOSE: 118 mg/dL — AB (ref 65–99)
POTASSIUM: 4.9 mmol/L (ref 3.5–5.3)
Sodium: 141 mmol/L (ref 135–146)
TOTAL PROTEIN: 6 g/dL — AB (ref 6.1–8.1)
Total Bilirubin: 0.3 mg/dL (ref 0.2–1.2)

## 2015-07-11 LAB — CBC
HCT: 34.4 % — ABNORMAL LOW (ref 36.0–46.0)
HEMOGLOBIN: 11.4 g/dL — AB (ref 12.0–15.0)
MCH: 29.3 pg (ref 26.0–34.0)
MCHC: 33.1 g/dL (ref 30.0–36.0)
MCV: 88.4 fL (ref 78.0–100.0)
MPV: 10.4 fL (ref 8.6–12.4)
Platelets: 127 10*3/uL — ABNORMAL LOW (ref 150–400)
RBC: 3.89 MIL/uL (ref 3.87–5.11)
RDW: 13 % (ref 11.5–15.5)
WBC: 5.3 10*3/uL (ref 4.0–10.5)

## 2015-07-11 MED ORDER — HYDROCODONE-ACETAMINOPHEN 5-325 MG PO TABS: 1.0000 | ORAL_TABLET | Freq: Four times a day (QID) | ORAL | Status: DC | PRN

## 2015-07-11 NOTE — Progress Notes (Signed)
Subjective: Chief Complaint  Patient presents with  . jammed fingers    fell   Here for finger pain.  Larey Seat recently landed on or against her right hand.  Since then has had bruising and pain of right 3rd and 4th fingers, but seems to bend them fine.   The fingers have been somewhat crooked for years s/p injury where arm got trapped under a bus years ago.   Denies any other pain or injury.   Been falling a lot lately.  falling close to supper time.   Seems a little more unsteady on feet since both pain medication and Depakote was changed last visit.    At the same time we had increased her pain medication to help with pain, psychiatry had doubled Depakote.   Denies confusion, speech or affect changes, no other symptom reported.  Seems her usual self, just had her 80th birthday.  No other new symptoms.  No other aggravating or relieving factors. No other complaint.  Review of Systems Constitutional: -fever, -chills, -sweats, -unexpected weight change,+fatigue ENT: -runny nose, -ear pain, -sore throat Cardiology:  -chest pain, -palpitations, -edema Respiratory: -cough, -shortness of breath, -wheezing Gastroenterology: -abdominal pain, -nausea, -vomiting, -diarrhea, -constipation Ophthalmology: -vision changes Urology: -dysuria, -difficulty urinating, -hematuria, -urinary frequency, -urgency Neurology: -headache, -weakness, -tingling, -numbness      Objective: BP 110/60 mmHg  Pulse 60  Temp(Src) 97.8 F (36.6 C) (Oral)  Wt 206 lb (93.441 kg)  Gen: wd, wn, nad Skin: diffuse purplish ecchymosis of right 3rd and 4th fingers MSK: mild tenderness throughout right 3rd and 4th fingers although ROM is quite full without a lot of pain Cap refill and pulses normal of UE Ext: swelling of right 4th and 3rd finger only, mildly  Neuro: seemingly normal strength and sensation of fingers and hand Right middle and 4th ginger with bruising throughout, but ROM seems full.   Neuro: alert, oriented,non focal  exam, seems her usual self Psych: pleasant, seems her normal self   Assessment: Encounter Diagnoses  Name Primary?  . Frequent falls Yes  . Finger pain, right   . Chronic pain syndrome   . Depression   . Mood disorder in conditions classified elsewhere   . High risk medication use   . Moderate persistent asthma, uncomplicated      Plan: I will complete her FL2 form  Falls - likely related to recent changes in medications .  We had made her pain medication BID instead of prn, and apparently psychiatry doubled Depakote around the same time and we were unaware.   We will back down dose of Norco today.   Her pain and mood has been much better, but now seems a little more unsteady with gait.  Labs today  finger pain - send for xray, but likely contusions without fracture.  discussed supportive care, avoiding re injury and avoiding falls  Asthma - doing fine on Pulmicort BID  F/u pending labs, xrays

## 2015-07-11 NOTE — Telephone Encounter (Signed)
[name] maloney called about [name], said yall had talked about the pain med hydrocodone,and was going to cut it half, said she was on 10mg -325  Two times a day before  and you pres her 5-325 mg 4xday . Wanted to make sure that's what you wanted to do that, because its not what yall had talked about,can be reached at [phone]

## 2015-07-11 NOTE — Telephone Encounter (Signed)
[facility]!  Epic sometimes prepopulates the info.  NO, it should be 5/325mg  hydrocodone BID, not QID.    So verify with her to just use BID like she had been doing.

## 2015-07-11 NOTE — Telephone Encounter (Signed)
Spoke with [name]'s caregiver and advised that she is to only get the medication two times daily, that four times daily was an error.  The caregiver stated that she needs a written order for this and that they will come by to pick it up on Wednesday as soon as someone calls them to say it is ready.

## 2015-07-13 DIAGNOSIS — M170 Bilateral primary osteoarthritis of knee: Secondary | ICD-10-CM | POA: Diagnosis not present

## 2015-07-13 NOTE — Telephone Encounter (Signed)
Please write out the Hydrocodone 5/325mg  for BID dosing on script pad #60 so I can sign.  They need written order.

## 2015-07-13 NOTE — Telephone Encounter (Signed)
Its on your desk to sign

## 2015-07-20 DIAGNOSIS — M170 Bilateral primary osteoarthritis of knee: Secondary | ICD-10-CM | POA: Diagnosis not present

## 2015-07-26 ENCOUNTER — Other Ambulatory Visit (INDEPENDENT_AMBULATORY_CARE_PROVIDER_SITE_OTHER): Payer: Medicare Other

## 2015-07-26 DIAGNOSIS — Z23 Encounter for immunization: Secondary | ICD-10-CM | POA: Diagnosis not present

## 2015-07-27 DIAGNOSIS — M170 Bilateral primary osteoarthritis of knee: Secondary | ICD-10-CM | POA: Diagnosis not present

## 2015-08-03 DIAGNOSIS — M1711 Unilateral primary osteoarthritis, right knee: Secondary | ICD-10-CM | POA: Diagnosis not present

## 2015-08-03 DIAGNOSIS — M1712 Unilateral primary osteoarthritis, left knee: Secondary | ICD-10-CM | POA: Diagnosis not present

## 2015-08-09 ENCOUNTER — Telehealth: Payer: Self-pay | Admitting: Family Medicine

## 2015-08-09 DIAGNOSIS — F329 Major depressive disorder, single episode, unspecified: Secondary | ICD-10-CM | POA: Diagnosis not present

## 2015-08-09 DIAGNOSIS — F71 Moderate intellectual disabilities: Secondary | ICD-10-CM | POA: Diagnosis not present

## 2015-08-09 DIAGNOSIS — I10 Essential (primary) hypertension: Secondary | ICD-10-CM

## 2015-08-09 NOTE — Telephone Encounter (Signed)
That would have been my thought

## 2015-08-09 NOTE — Telephone Encounter (Signed)
[facility] called and wants to know what pt has to do to meet the requirements for a skilled nursing facility or assisted living?   [name] states pt needs a higher level of care.   Is there an Elder Assessment that she can get?  Please let [name] know [phone].  I have sent a request to Ellyn Hack of [facility] to see if she can help.

## 2015-08-10 ENCOUNTER — Telehealth: Payer: Self-pay | Admitting: Medical

## 2015-08-10 DIAGNOSIS — M1711 Unilateral primary osteoarthritis, right knee: Secondary | ICD-10-CM | POA: Diagnosis not present

## 2015-08-10 DIAGNOSIS — M1712 Unilateral primary osteoarthritis, left knee: Secondary | ICD-10-CM | POA: Diagnosis not present

## 2015-08-10 NOTE — Telephone Encounter (Signed)
[name] called and stated that they may have found a place for Miss. [name]. The facility is requiring a PASRR from be completed for them to review. I found the provider manual and the form that needs to be completed. Please review information and advise if she needs to come in for completion or if you can fill out without an appt. [name] can be reached at [phone]. I am sending back in your folder for review.

## 2015-08-11 NOTE — Telephone Encounter (Signed)
[name] called and they need medical records re: pulmicort neb to send to Medicare regarding an audit. They will send letter via fax. Please advise Toni Amend when you get it. thanks

## 2015-08-15 ENCOUNTER — Other Ambulatory Visit: Payer: Self-pay | Admitting: Medical

## 2015-08-15 NOTE — Telephone Encounter (Signed)
[name] called to check status of forms I sent back. Please advise.

## 2015-08-16 ENCOUNTER — Telehealth: Payer: Self-pay | Admitting: Family Medicine

## 2015-08-16 ENCOUNTER — Other Ambulatory Visit: Payer: Self-pay

## 2015-08-16 NOTE — Patient Outreach (Signed)
Unsuccessful attempt made to contact patient regarding referral from primary care physician's office. HIPPA compliant message left with this RNCM's return contact information.    Message sent to Nena Polio to follow up with this referral to [facility].

## 2015-08-16 NOTE — Telephone Encounter (Signed)
So do I need to fax these froms any where or are they completely taken care of?

## 2015-08-16 NOTE — Telephone Encounter (Signed)
Check with Lafonda Mosses, not sure if she faxed them but I believe she did

## 2015-08-16 NOTE — Telephone Encounter (Signed)
Forms completed

## 2015-08-16 NOTE — Telephone Encounter (Signed)
[name] called back to check on status of forms. I have asked Laureen Ochs to advise. Forwarding back to her.

## 2015-08-16 NOTE — Telephone Encounter (Signed)
Called [facility] advised her that the forms are ready.. Also asked if social worker from[facility] had contacted her yet and she said no.  I will follow up with [facility]

## 2015-08-16 NOTE — Telephone Encounter (Signed)
Did you already fax everything for her?

## 2015-08-16 NOTE — Telephone Encounter (Signed)
Forms complete and [facility] picking up today.  Copy has been made for our office.

## 2015-08-17 ENCOUNTER — Telehealth: Payer: Self-pay

## 2015-08-17 ENCOUNTER — Ambulatory Visit (INDEPENDENT_AMBULATORY_CARE_PROVIDER_SITE_OTHER): Payer: Medicare Other | Admitting: Podiatry

## 2015-08-17 DIAGNOSIS — E119 Type 2 diabetes mellitus without complications: Secondary | ICD-10-CM

## 2015-08-17 DIAGNOSIS — B351 Tinea unguium: Secondary | ICD-10-CM

## 2015-08-17 DIAGNOSIS — M79673 Pain in unspecified foot: Secondary | ICD-10-CM

## 2015-08-17 NOTE — Progress Notes (Signed)
Subjective:     Patient ID: [name], female   DOB: 03-19-1935, 80 y.o.   MRN: [id number]  HPIThis patient returns for continued diabetic care for her nails and calluses.  She has been diagnosed with diabetes.  She says her nails and calluses are painful walking and wearing her shoes.  She presents for evaluation and treatment.   Review of Systems     Objective:   Physical Exam Objective: Review of past medical history, medications, social history and allergies were performed.  Vascular: Dorsalis pedis and posterior tibial pulses were palpable B/L, capillary refill was  WNL B/L, temperature gradient was WNL B/L   Skin:  Asymptomatic pinch callus B/l  Nails: Thick disfigured and discolored painful nails both feet.  Sensory: Phoebe Perch monifilament WNL   Orthopedic: Orthopedic evaluation demonstrates all joints distal t ankle have full ROM without crepitus, muscle power WNL B/L     Assessment:    Onychomycosis   Porokeratosis     Plan:     Debricdement of Nails.  Debridement of porokeratosis B/L.  RTC 3 months

## 2015-08-17 NOTE — Patient Outreach (Signed)
This RNCM received a call from person who identified herself as [name], Caretaker of group home where patient resides.  [name] was able to identify patient by providing name, date of birth and address.  [name] states patient is currently living at the group home, however, patient is having increased medical needs exceed what the group home can provide.    [name] stated she has already obtained a FL2, had it filled out and signed by patient's primary care physician, scanned into the system at [facility].  [name] stated she had also obtained a PASAAR number.  [name] asked advice on what to do next.    [name] advised that this RNCM has not received a referral for this patient, however, [name] was directed the next step would be to chose a nursing center, [name] stated the skilled level was what was requested on the FL2.  [name] encouraged to visit the nursing center she chooses, could find information on skilled nursing centers on the [facility].    [name] asked to call back if further assistance is need.

## 2015-08-21 ENCOUNTER — Other Ambulatory Visit: Payer: Self-pay | Admitting: Medical

## 2015-08-21 ENCOUNTER — Telehealth: Payer: Self-pay | Admitting: Medical

## 2015-08-21 MED ORDER — ALBUTEROL SULFATE (2.5 MG/3ML) 0.083% IN NEBU: 2.5000 mg | INHALATION_SOLUTION | Freq: Four times a day (QID) | RESPIRATORY_TRACT | Status: DC | PRN

## 2015-08-21 NOTE — Telephone Encounter (Signed)
Pt made aware

## 2015-08-21 NOTE — Telephone Encounter (Signed)
Faxed medical records to [facility] for pulmicort,

## 2015-08-21 NOTE — Telephone Encounter (Signed)
Since she has nebulizer machine, she can just use Albuterol liquid for nebulizer as needed q4-6 hours.   This is different from the other daily medication she uses by nebulizer for prevention.   So at this point, unless she is away from the nebulizer for hours to days at a time, she doesn't have to have the hand held inhaler if she has access to the liquid albuterol and neb machine.   So if [name] is ok with this, she can be discontinued from the [facility] (hand held) inhaler for albuterol.

## 2015-08-21 NOTE — Telephone Encounter (Signed)
Ok, I will refill for smaller quantity.

## 2015-08-21 NOTE — Telephone Encounter (Signed)
Her albuterol vials are what expired (for PRN). Not the handheld. She has not been using it much at all.

## 2015-08-21 NOTE — Telephone Encounter (Signed)
Pt's caregiver, Reece Levy, called stating that she just recently discarded the pt's Albuterol med because it expired before she used it. Pt only used in once in the past 7 months. Does she need to continue to be on this med? If so, she will need another script & it will need to be a small script. Should it only be PRN? If pt is taken off of this med, Reece Levy will need an order discontinuing the Albuterol.

## 2015-08-24 ENCOUNTER — Other Ambulatory Visit: Payer: Self-pay

## 2015-08-24 DIAGNOSIS — E1159 Type 2 diabetes mellitus with other circulatory complications: Secondary | ICD-10-CM

## 2015-08-24 NOTE — Patient Outreach (Signed)
[facility]) Care Management  08/24/2015  [name] 1935/09/07 [id number]   Request from Emilia Beck, RN to assign SW, assigned [employer], LCSW.  Thanks, Corrie Mckusick. Sharlee Blew [facility] Care Management [facility] CM Assistant Phone: [phone] Fax: [phone]

## 2015-08-25 ENCOUNTER — Encounter: Payer: Self-pay | Admitting: *Deleted

## 2015-08-25 ENCOUNTER — Telehealth: Payer: Self-pay

## 2015-08-25 NOTE — Telephone Encounter (Signed)
[facility] called in regards to [name] saying you filled out a PASRR for her but it turns out you did one for assisted living instead of skilled nursing care. Could you fill out one for skilled nursing care. You were supposed to call her tonight, but the nursing home has apparently called it off and she isn't going right now, they won't accept her without the skilled nursing care form. So don't call her and get the correct form.

## 2015-08-28 ENCOUNTER — Other Ambulatory Visit: Payer: Self-pay | Admitting: *Deleted

## 2015-08-28 ENCOUNTER — Telehealth: Payer: Self-pay | Admitting: Family Medicine

## 2015-08-28 NOTE — Patient Outreach (Signed)
[facility]) Care Management  08/28/2015  [name] May 08, 1935 [id number]   CSW received a new referral on patient from patient's RNCM with [facility] Care Management, [name] reporting that patient is in need of assistance with arranging long-term care placement into a higher level of care. [name] indicated that patient currently resides at [facility], a group home that only offers an assisted living level of care.  Patient now requires long-term care placement into a skilled nursing facility, as patient is in need of 24 hour care and supervision.  Patient's friend and caregiver, [name] is prepared to assist CSW with possible placement arrangements. After thorough review of patient's EMR (Electronic Medical Record) in [employer], CSW noted that [name] has been in constant communication with patient's Primary Care Physician, [name] and that an FL-2 Form, required for placement, has already been initiated and faxed to facilities of interest by patient's Medicaid Case Worker with the [facility] Department of Social Services.  [name] was encouraged to view facilities that made bed offers and decide upon a facility in which patient would reside for long-term care.   A PASARR # was obtained by the skilled nursing facility where patient will be placed and all other necessary paperwork, such as the FL-2 Form, has been submitted for review.  [name] is in the process of having patient transferred to the skilled nursing facility that was chosen, and will work with patient's Medicaid Case Worker to get patient's Adult Medicaid coverage changed to Long-Term Care Medicaid coverage.  No social work services or assistance is needed at this time; therefore, CSW will perform a case closure.  CSW will fax a barriers letter and a correspondence letter to Dr. Susann Givens.  CSW will also converse with [name] to report findings of placement  arrangements, already in place for patient.  Danford Bad, BSW, MSW, LCSW  Licensed Restaurant manager, [employer] [facility]  [employer] [place] [address] [place], [place] [address] Physical Address-[address], [place], [place] [address] Toll Free Main # [phone] Fax # [phone] Cell # [phone]  Fax # [phone]  [email or web address]

## 2015-08-28 NOTE — Telephone Encounter (Signed)
[name] said to tell you thank you for helping with the nursing home forms but it turns out that they do not need for you to complete any forms after all. The nursing home completed all necessary forms to get pt transferred to their facility.

## 2015-08-29 NOTE — Telephone Encounter (Signed)
Spoke with [name] and she confirmed that she does not need to speak with Vincenza Hews. She also wanted to mention to Vincenza Hews that pt has a CPE appt on 11/14 and she is very upset about being moved to the nursing home and she blames Vincenza Hews for this. Pt will be moving to the nursing home on 11/30.

## 2015-08-29 NOTE — Telephone Encounter (Signed)
Does [name] still need me to call as she asked?

## 2015-09-07 ENCOUNTER — Telehealth: Payer: Self-pay | Admitting: Medical

## 2015-09-07 NOTE — Telephone Encounter (Signed)
Pt's caregiver,[name], says that pt's left hand is very puffy. She noticed it yesterday and pt was not complaining about it, have not fallen or injured the hand. After [name] brought the swolleness to pt's attention, she said it bothers her but has been functioning as normal. [name] wants to know if they should be concerned or should do anything about this before pt's appointment on Monday.

## 2015-09-07 NOTE — Telephone Encounter (Signed)
This was taken care of, correct?

## 2015-09-07 NOTE — Telephone Encounter (Signed)
See other message.  This was in error.

## 2015-09-07 NOTE — Telephone Encounter (Signed)
Called and LM for [name] with Shane's instructions. Pt's hand does have normal color now and they will call the office if there are any changes to pt's hand before Monday's appointment.

## 2015-09-07 NOTE — Telephone Encounter (Signed)
As long as the hand seems to have good color and she hasn't fallen on it, then can probably wait til Monday.  If she fell and hurt the hand, then could be seen sooner.

## 2015-09-11 ENCOUNTER — Encounter: Payer: Self-pay | Admitting: Medical

## 2015-09-11 ENCOUNTER — Ambulatory Visit (INDEPENDENT_AMBULATORY_CARE_PROVIDER_SITE_OTHER): Payer: Medicare Other | Admitting: Medical

## 2015-09-11 VITALS — BP 120/60 | HR 122 | Ht 68.0 in | Wt 204.0 lb

## 2015-09-11 DIAGNOSIS — F0630 Mood disorder due to known physiological condition, unspecified: Secondary | ICD-10-CM

## 2015-09-11 DIAGNOSIS — R8290 Unspecified abnormal findings in urine: Secondary | ICD-10-CM | POA: Diagnosis not present

## 2015-09-11 DIAGNOSIS — Z7901 Long term (current) use of anticoagulants: Secondary | ICD-10-CM | POA: Diagnosis not present

## 2015-09-11 DIAGNOSIS — E118 Type 2 diabetes mellitus with unspecified complications: Secondary | ICD-10-CM

## 2015-09-11 DIAGNOSIS — F70 Mild intellectual disabilities: Secondary | ICD-10-CM

## 2015-09-11 DIAGNOSIS — N189 Chronic kidney disease, unspecified: Secondary | ICD-10-CM

## 2015-09-11 DIAGNOSIS — Z7409 Other reduced mobility: Secondary | ICD-10-CM

## 2015-09-11 DIAGNOSIS — R8299 Other abnormal findings in urine: Secondary | ICD-10-CM | POA: Diagnosis not present

## 2015-09-11 DIAGNOSIS — J309 Allergic rhinitis, unspecified: Secondary | ICD-10-CM | POA: Diagnosis not present

## 2015-09-11 DIAGNOSIS — R259 Unspecified abnormal involuntary movements: Secondary | ICD-10-CM | POA: Diagnosis not present

## 2015-09-11 DIAGNOSIS — J4540 Moderate persistent asthma, uncomplicated: Secondary | ICD-10-CM | POA: Diagnosis not present

## 2015-09-11 DIAGNOSIS — D638 Anemia in other chronic diseases classified elsewhere: Secondary | ICD-10-CM | POA: Diagnosis not present

## 2015-09-11 DIAGNOSIS — I422 Other hypertrophic cardiomyopathy: Secondary | ICD-10-CM

## 2015-09-11 DIAGNOSIS — Z0000 Encounter for general adult medical examination without abnormal findings: Secondary | ICD-10-CM | POA: Diagnosis not present

## 2015-09-11 DIAGNOSIS — M170 Bilateral primary osteoarthritis of knee: Secondary | ICD-10-CM | POA: Diagnosis not present

## 2015-09-11 DIAGNOSIS — Z23 Encounter for immunization: Secondary | ICD-10-CM | POA: Diagnosis not present

## 2015-09-11 DIAGNOSIS — M8580 Other specified disorders of bone density and structure, unspecified site: Secondary | ICD-10-CM

## 2015-09-11 DIAGNOSIS — F329 Major depressive disorder, single episode, unspecified: Secondary | ICD-10-CM

## 2015-09-11 DIAGNOSIS — K219 Gastro-esophageal reflux disease without esophagitis: Secondary | ICD-10-CM

## 2015-09-11 DIAGNOSIS — F32A Depression, unspecified: Secondary | ICD-10-CM

## 2015-09-11 DIAGNOSIS — I739 Peripheral vascular disease, unspecified: Secondary | ICD-10-CM

## 2015-09-11 DIAGNOSIS — D692 Other nonthrombocytopenic purpura: Secondary | ICD-10-CM

## 2015-09-11 DIAGNOSIS — R296 Repeated falls: Secondary | ICD-10-CM

## 2015-09-11 DIAGNOSIS — I10 Essential (primary) hypertension: Secondary | ICD-10-CM | POA: Diagnosis not present

## 2015-09-11 DIAGNOSIS — J479 Bronchiectasis, uncomplicated: Secondary | ICD-10-CM

## 2015-09-11 DIAGNOSIS — Z79899 Other long term (current) drug therapy: Secondary | ICD-10-CM

## 2015-09-11 DIAGNOSIS — G894 Chronic pain syndrome: Secondary | ICD-10-CM | POA: Insufficient documentation

## 2015-09-11 DIAGNOSIS — Z86718 Personal history of other venous thrombosis and embolism: Secondary | ICD-10-CM | POA: Insufficient documentation

## 2015-09-11 LAB — HEMOGLOBIN A1C
HEMOGLOBIN A1C: 6.6 % — AB (ref <5.7)
MEAN PLASMA GLUCOSE: 143 mg/dL — AB (ref <117)

## 2015-09-11 LAB — RENAL FUNCTION PANEL
Albumin: 3.7 g/dL (ref 3.6–5.1)
BUN: 25 mg/dL (ref 7–25)
CALCIUM: 9.3 mg/dL (ref 8.6–10.4)
CHLORIDE: 100 mmol/L (ref 98–110)
CO2: 32 mmol/L — ABNORMAL HIGH (ref 20–31)
CREATININE: 1.53 mg/dL — AB (ref 0.60–0.93)
GLUCOSE: 127 mg/dL — AB (ref 65–99)
Phosphorus: 3.7 mg/dL (ref 2.1–4.3)
Potassium: 4.9 mmol/L (ref 3.5–5.3)
Sodium: 140 mmol/L (ref 135–146)

## 2015-09-11 LAB — POCT URINALYSIS DIPSTICK
BILIRUBIN UA: NEGATIVE
Blood, UA: NEGATIVE
GLUCOSE UA: NEGATIVE
KETONES UA: NEGATIVE
PH UA: 6
Protein, UA: NEGATIVE
Spec Grav, UA: 1.025
Urobilinogen, UA: NEGATIVE

## 2015-09-11 LAB — TSH: TSH: 1.222 u[IU]/mL (ref 0.350–4.500)

## 2015-09-11 LAB — IRON: IRON: 125 ug/dL (ref 45–160)

## 2015-09-11 MED ORDER — LORATADINE 10 MG PO TABS: 10.0000 mg | ORAL_TABLET | Freq: Every day | ORAL | Status: DC

## 2015-09-11 MED ORDER — HYDROCODONE-ACETAMINOPHEN 5-325 MG PO TABS: 1.0000 | ORAL_TABLET | Freq: Four times a day (QID) | ORAL | Status: DC | PRN

## 2015-09-11 MED ORDER — BUDESONIDE 0.5 MG/2ML IN SUSP: RESPIRATORY_TRACT | Status: DC

## 2015-09-11 MED ORDER — RIVAROXABAN 20 MG PO TABS: 20.0000 mg | ORAL_TABLET | Freq: Every day | ORAL | Status: DC

## 2015-09-11 MED ORDER — IBANDRONATE SODIUM 150 MG PO TABS: 150.0000 mg | ORAL_TABLET | ORAL | Status: DC

## 2015-09-11 MED ORDER — ISOSORBIDE MONONITRATE ER 30 MG PO TB24: 30.0000 mg | ORAL_TABLET | Freq: Every day | ORAL | Status: DC

## 2015-09-11 MED ORDER — METOPROLOL TARTRATE 25 MG PO TABS: 25.0000 mg | ORAL_TABLET | Freq: Two times a day (BID) | ORAL | Status: DC

## 2015-09-11 MED ORDER — FLUTICASONE PROPIONATE 50 MCG/ACT NA SUSP: 2.0000 | Freq: Every day | NASAL | Status: DC

## 2015-09-11 MED ORDER — OMEPRAZOLE 20 MG PO CPDR: 20.0000 mg | DELAYED_RELEASE_CAPSULE | Freq: Every day | ORAL | Status: DC

## 2015-09-11 MED ORDER — MONTELUKAST SODIUM 10 MG PO TABS: 10.0000 mg | ORAL_TABLET | Freq: Every day | ORAL | Status: DC

## 2015-09-11 MED ORDER — FUROSEMIDE 40 MG PO TABS: 20.0000 mg | ORAL_TABLET | Freq: Every day | ORAL | Status: DC

## 2015-09-11 MED ORDER — SIMVASTATIN 20 MG PO TABS: 20.0000 mg | ORAL_TABLET | Freq: Every day | ORAL | Status: DC

## 2015-09-11 MED ORDER — DOCUSATE SODIUM 100 MG PO CAPS: 100.0000 mg | ORAL_CAPSULE | Freq: Two times a day (BID) | ORAL | Status: DC

## 2015-09-11 MED ORDER — METOCLOPRAMIDE HCL 5 MG PO TABS
5.0000 mg | ORAL_TABLET | Freq: Every day | ORAL | Status: DC
Start: 2015-09-11 — End: 2016-12-04

## 2015-09-11 MED ORDER — CALCIUM 1200 1200-1000 MG-UNIT PO CHEW: 1000.0000 mg | CHEWABLE_TABLET | Freq: Once | ORAL | Status: DC

## 2015-09-11 MED ORDER — POTASSIUM CHLORIDE CRYS ER 20 MEQ PO TBCR: 20.0000 meq | EXTENDED_RELEASE_TABLET | Freq: Every day | ORAL | Status: DC
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
## 2015-09-11 NOTE — Addendum Note (Signed)
Addended by: Kieth Brightly on: 09/11/2015 11:22 AM   Modules accepted: Orders, SmartSet

## 2015-09-12 ENCOUNTER — Other Ambulatory Visit: Payer: Self-pay | Admitting: Medical

## 2015-09-12 DIAGNOSIS — M8580 Other specified disorders of bone density and structure, unspecified site: Secondary | ICD-10-CM

## 2015-09-12 DIAGNOSIS — M170 Bilateral primary osteoarthritis of knee: Secondary | ICD-10-CM | POA: Diagnosis not present

## 2015-09-12 LAB — MICROALBUMIN / CREATININE URINE RATIO
CREATININE, URINE: 171 mg/dL (ref 20–320)
MICROALB/CREAT RATIO: 10 ug/mg{creat} (ref <30)
Microalb, Ur: 1.7 mg/dL

## 2015-09-12 MED ORDER — FERROUS SULFATE 325 (65 FE) MG PO TABS: 325.0000 mg | ORAL_TABLET | Freq: Every day | ORAL | Status: DC

## 2015-09-12 MED ORDER — ALBUTEROL SULFATE (2.5 MG/3ML) 0.083% IN NEBU: 2.5000 mg | INHALATION_SOLUTION | Freq: Four times a day (QID) | RESPIRATORY_TRACT | Status: DC | PRN

## 2015-09-13 ENCOUNTER — Other Ambulatory Visit: Payer: Self-pay | Admitting: Medical

## 2015-09-13 LAB — URINE CULTURE

## 2015-09-13 MED ORDER — AMOXICILLIN 875 MG PO TABS: 875.0000 mg | ORAL_TABLET | Freq: Two times a day (BID) | ORAL | Status: DC

## 2015-09-19 ENCOUNTER — Telehealth: Payer: Self-pay | Admitting: Family Medicine

## 2015-09-19 NOTE — Telephone Encounter (Signed)
Abran Cantor called and states the State needs you to call them so that the transfer of pt to skilled nursing facility does not fall through.  Please call Monday at the latest.  They want to hear from you why pt needs skilled nursing facility, does she need PTOT, a nurse to closely monitor something, what qualifies her? Please call Gaynelle Adu or Carley Hammed at  [phone] as soon as you can

## 2015-09-19 NOTE — Telephone Encounter (Signed)
pls try and call them  The reasons for skilled nursing is that her health is steadily declining in the last year, she is falling frequently, needs use of wheelchair, and they don't have space at the group home to accommodate wheelchairs, she is on blood thinner, which puts her at higher risk of bleed with falls.  They are having harder time caring for her in the group home facility.

## 2015-09-20 NOTE — Telephone Encounter (Signed)
Casimiro Needle is on vacation and [name] had stepped out of office but I left a voicemail on her machine to call me back

## 2015-09-20 NOTE — Telephone Encounter (Signed)
Called and talked to [facility] and she said that she needed a detailed outline faxed over that stated the specific needs for the skilled nursing home and why she needs to be there. They said that just because she needs is falling and needs a wheelchair is not enough reason for that to be approved. She said it was a very strict process. She also said that if it didn't meet requirements, which she said it did not sound that it would, that she would not be moved. The fax number is [phone]. Elray Buba and she told me to call Vincenza Hews. Vincenza Hews said that he will do it and that it wont be done today so I am calling the Group Home now to inform then. They said that they are going to try and get a list together too. That way they can help Vincenza Hews make sure that nothing is missed to raise her possibility of getting approved.

## 2015-09-26 ENCOUNTER — Encounter: Payer: Self-pay | Admitting: Medical

## 2015-09-26 ENCOUNTER — Telehealth: Payer: Self-pay | Admitting: Family Medicine

## 2015-09-26 ENCOUNTER — Telehealth: Payer: Self-pay

## 2015-09-26 NOTE — Telephone Encounter (Signed)
They are looking for a new facility but she will let us know when they find one and she said thank you for being so helpful

## 2015-09-26 NOTE — Telephone Encounter (Signed)
Letter was faxed today

## 2015-09-26 NOTE — Telephone Encounter (Signed)
They said that they can only think of her needing the nebulizer every day is the only thing that from what they see online that could see to get her in the skilled nursing home. Said that they think putting in that she would benefit from physical therapy because she regressed since she stopped going. [name] is calling the skilled nursing home to ask them things that could be put in the outline that the state needs faxed over.

## 2015-09-26 NOTE — Telephone Encounter (Signed)
Vista Deck sent email that states [facility] said since there was this much trouble getting the PASRR # for SNF that it was a red flag and that they were not willing to accept her.  The admissions officer said that she had never heard of a situation like this, where [employer] was asking for so much justification.  She said that her superiors told her not to accept [facility] into the program.  So the [name] is a moot point., but thanks for the efforts.

## 2015-09-26 NOTE — Telephone Encounter (Signed)
Please go ahead and send the letter.  I don't know what else to do.   Please see if Lafonda Mosses can help with this transition.  I don't know what other criteria they would need?

## 2015-09-26 NOTE — Telephone Encounter (Signed)
Vista Deck sent me an email that said [facility] checked for a PASRR number and did not find one.  She is supposed to move tomorrow.  Were you able to speak with someone at [facility] to justify Para March moving to a SNF?  Please advise [name] as soon as you can

## 2015-09-26 NOTE — Telephone Encounter (Signed)
Please call and get them on the phone now

## 2015-09-26 NOTE — Telephone Encounter (Signed)
Spoke with deb earlier and faxed over the outline done by shane

## 2015-09-26 NOTE — Telephone Encounter (Signed)
She was denied by the skilled nursing facility. So now they are looking for an assisted living place. The skilled nursing said that it was a red flag to them that it was being delayed so that even if she was approved by the state today they will not take her.

## 2015-09-29 ENCOUNTER — Telehealth: Payer: Self-pay

## 2015-09-29 NOTE — Telephone Encounter (Signed)
Please let [name] know it should be for 1/2 tablet daily as she was doing already, not one tablet daily

## 2015-09-29 NOTE — Telephone Encounter (Signed)
[name] called about her potassium chloride prescription, it was to take 1/2 a pill, but the one they just picked up says to take 1 whole pill. [name] was not informed of the change and they want to know if this was an error or if it is right and if they should givr this new dose to her or should they get a new one ordered?

## 2015-10-02 ENCOUNTER — Other Ambulatory Visit: Payer: Self-pay | Admitting: Medical

## 2015-10-02 ENCOUNTER — Other Ambulatory Visit: Payer: Self-pay

## 2015-10-02 ENCOUNTER — Telehealth: Payer: Self-pay | Admitting: Medical

## 2015-10-02 DIAGNOSIS — Z1231 Encounter for screening mammogram for malignant neoplasm of breast: Secondary | ICD-10-CM

## 2015-10-02 MED ORDER — POTASSIUM CHLORIDE ER 10 MEQ PO TBCR: 10.0000 meq | EXTENDED_RELEASE_TABLET | Freq: Every day | ORAL | Status: DC

## 2015-10-02 NOTE — Telephone Encounter (Signed)
Make sure [name] knows to continue her potassium as usual, not as the new directions were showing.   There seems to be a computer issue with refills that led to the change/error.

## 2015-10-02 NOTE — Telephone Encounter (Signed)
She would like to have a new prescription sent to the pharmacy specifying 1/2 a pill instead of 1 whole pill.

## 2015-10-02 NOTE — Telephone Encounter (Signed)
pls send (fax, call in, etc) new script for potassium.  Since they have been taking 1/2 tablet daily, I just changed it to , and take 1 tablet daily.  This is the same dosing, but not having to have them cut the pill in half daily.   So call them about the change, and make sure pharmacy gets the order and stops the 1/2 tablet daily order.

## 2015-10-02 NOTE — Telephone Encounter (Signed)
Said that she did have 3 full tablets this weekend. She said that they have to follow the label and want to see if you can send in the new prescription and they can get it all relabeled at the pharmacy?

## 2015-10-03 NOTE — Telephone Encounter (Signed)
Spoke with pharmacist he said that he would take the wrong one back and give the correct one and i faxed over the rx

## 2015-10-03 NOTE — Telephone Encounter (Signed)
This was done 10/02/15

## 2015-10-09 ENCOUNTER — Telehealth: Payer: Self-pay | Admitting: Medical

## 2015-10-09 NOTE — Telephone Encounter (Signed)
[name] dropped off forms to be completed. I am sending back in a manila envelope.

## 2015-11-06 ENCOUNTER — Other Ambulatory Visit: Payer: Self-pay

## 2015-11-06 ENCOUNTER — Ambulatory Visit: Payer: Self-pay

## 2015-11-09 ENCOUNTER — Ambulatory Visit: Payer: Medicare Other | Admitting: Podiatry

## 2015-11-10 ENCOUNTER — Telehealth: Payer: Self-pay | Admitting: Medical

## 2015-11-10 NOTE — Telephone Encounter (Signed)
A rep from [facility] ([name]) dropped off paper work that needs to be completed. I am bringing that back to you. She also stated that [name] needs a refill on Budesonide sent to [facility]. [name] can be reached at [phone].

## 2015-11-16 ENCOUNTER — Ambulatory Visit (INDEPENDENT_AMBULATORY_CARE_PROVIDER_SITE_OTHER): Payer: Medicare Other | Admitting: Podiatry

## 2015-11-16 ENCOUNTER — Encounter: Payer: Self-pay | Admitting: Podiatry

## 2015-11-16 DIAGNOSIS — E119 Type 2 diabetes mellitus without complications: Secondary | ICD-10-CM

## 2015-11-16 DIAGNOSIS — B351 Tinea unguium: Secondary | ICD-10-CM

## 2015-11-16 DIAGNOSIS — M79673 Pain in unspecified foot: Secondary | ICD-10-CM | POA: Diagnosis not present

## 2015-11-16 NOTE — Progress Notes (Signed)
Subjective:     Patient ID: [name], female   DOB: [birth year], 81 y.o.   MRN: [id number]  HPIThis patient returns for continued diabetic care for her nails and calluses.  She has been diagnosed with diabetes.  She says her nails and calluses are painful walking and wearing her shoes.  She presents for evaluation and treatment.   Review of Systems     Objective:   Physical Exam Objective: Review of past medical history, medications, social history and allergies were performed.  Vascular: Dorsalis pedis and posterior tibial pulses were palpable B/L, capillary refill was  WNL B/L, temperature gradient was WNL B/L   Skin:  Asymptomatic pinch callus B/l  Nails: Thick disfigured and discolored painful nails both feet.  Sensory: Phoebe Perch monifilament WNL   Orthopedic: Orthopedic evaluation demonstrates all joints distal t ankle have full ROM without crepitus, muscle power WNL B/L     Assessment:    Onychomycosis   Porokeratosis     Plan:     Debricdement of Nails.  Debridement of porokeratosis B/L.  RTC 3 months      Helane Gunther DPM

## 2015-11-17 ENCOUNTER — Other Ambulatory Visit: Payer: Self-pay | Admitting: Medical

## 2015-11-17 DIAGNOSIS — J4540 Moderate persistent asthma, uncomplicated: Secondary | ICD-10-CM

## 2015-11-17 MED ORDER — BUDESONIDE 0.5 MG/2ML IN SUSP: RESPIRATORY_TRACT | Status: DC

## 2015-11-17 NOTE — Telephone Encounter (Signed)
Form ready to copy and return

## 2015-11-17 NOTE — Telephone Encounter (Signed)
[facility] called.

## 2015-11-20 ENCOUNTER — Telehealth: Payer: Self-pay | Admitting: Medical

## 2015-11-20 NOTE — Telephone Encounter (Signed)
Pt's caregiver, [name], dropped off form from [employer] requesting two signatures from [employer]. [name] states that these signatures are needed ASAP so that pt can proceed with her move to other facility on this Thursday. She would really like the form today & no later than tomorrow. If Vincenza Hews can not sign by deadline, [name] has requested that Dr Susann Givens sign so that this does not hold pt from moving on Thursday. Call [name] at [phone] when form is ready for pick up.

## 2015-11-20 NOTE — Telephone Encounter (Signed)
done

## 2015-11-20 NOTE — Telephone Encounter (Signed)
Called [name] and left message that form is ready for pick up

## 2015-11-24 ENCOUNTER — Encounter: Payer: Self-pay | Admitting: Medical

## 2015-11-24 ENCOUNTER — Ambulatory Visit (INDEPENDENT_AMBULATORY_CARE_PROVIDER_SITE_OTHER): Payer: Medicare Other | Admitting: Medical

## 2015-11-24 VITALS — BP 118/60 | HR 69 | Temp 98.2°F | Wt 208.0 lb

## 2015-11-24 DIAGNOSIS — J4540 Moderate persistent asthma, uncomplicated: Secondary | ICD-10-CM

## 2015-11-24 DIAGNOSIS — R1084 Generalized abdominal pain: Secondary | ICD-10-CM

## 2015-11-24 DIAGNOSIS — R110 Nausea: Secondary | ICD-10-CM | POA: Diagnosis not present

## 2015-11-24 DIAGNOSIS — G894 Chronic pain syndrome: Secondary | ICD-10-CM | POA: Diagnosis not present

## 2015-11-24 LAB — POCT URINALYSIS DIPSTICK
BILIRUBIN UA: NEGATIVE
Blood, UA: NEGATIVE
GLUCOSE UA: NEGATIVE
KETONES UA: NEGATIVE
Nitrite, UA: NEGATIVE
PH UA: 6
Protein, UA: NEGATIVE
Spec Grav, UA: 1.025
Urobilinogen, UA: NEGATIVE

## 2015-11-24 MED ORDER — HYDROCODONE-ACETAMINOPHEN 5-325 MG PO TABS: 1.0000 | ORAL_TABLET | Freq: Four times a day (QID) | ORAL | Status: DC | PRN

## 2015-11-24 NOTE — Progress Notes (Addendum)
Subjective: Chief Complaint  Patient presents with  . stomach pain    for about a week. more unsteady than usual. does know she is moving to another facility and could be that.    Here with [name] from group home as usual.   She is getting ready to transition to new facility.   There have been road blocks in recent weeks getting [facility] moved to a new SNF facility.  [name] brought her in as she has some recent c/o abdominal discomfort, nausea, not feeling well, somewhat unsteady on feet.   [name] thinks it could be her nerves since she is upset and fearful about the move to the SNF.   Additionally insurance and pharmacy lately stated they need a new script for a nebulizer.  She is about out of her chronic pain medications.   Denies fever, diarrhea, urinary c/o, no vomiting, no back pain, no recent fall. No other aggravating or relieving factors. No other complaint.  Past Medical History  Diagnosis Date  . PVD (peripheral vascular disease) (HCC)   . Hypertension   . Diabetes mellitus   . GERD (gastroesophageal reflux disease)   . Hx of deep venous thrombosis     lifelong anticoagulant therapy  . Constipation     mild intermittent  . Chronic kidney disease   . Thrombocytopenia (HCC) 10/2010    due to medications and immune dysregulation likely, stable as of 2015; no further investigation; hematology, Dr. Arline Asp  . Allergy   . Mild mental retardation   . Mood disorder (HCC)     [facility] - NP Lolita Cram  . Falls   . Diabetes type 2, controlled (HCC)   . Long term current use of anticoagulant therapy     due to hx/o recurrent DVT  . Tremor 2015    consult with Dr. Mindi Slicker Neurology.  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 Depression     [facility] - NP Lolita Cram  . Edentulous   . Osteoarthritis of both knees     Dr. Gean Birchwood  . Hypertrophic obstructive cardiomyopathy (HOCM) (HCC) 2015    normal echo and stress test other than mild  LVH 06/2014; Dr. Dietrich Pates  . H/O echocardiogram 06/17/14    mild LVH, EF 60-65%, no wall motion abnormalities  . H/O cardiovascular stress test 06/2014    nuclear stress test normal; Dr. Huston Foley  . Shortness of breath     06/2014 cardiac consult, SOB seems to be related to poor technique with handheld inhaler, switched to nebs with much improvement  . Osteopenia 2013    improvements on Boniva and Ca+D from 2011-2013.  2013 Bone Density normal /improved; repeat Bone Density scan 2016  . Anemia of chronic disease 10/2010    stable as of 2015, on iron therapy for mild iron deficiency, chronic kidney disease, anemia of chronic disease; Dr. Arline Asp prior consult  . H/O mammogram 08/04/2014    normal  . Hyperlipidemia   . History of MRI of brain and brain stem 11/2010    normal MRI of brain  . Moderate persistent asthma     asthma and bronchiectasis, pulm consult 2015; unable to do PFTs, 2015   ROS as in subjective   Objective: BP 118/60 mmHg  Pulse 69  Temp(Src) 98.2 F (36.8 C) (Tympanic)  Wt 208 lb (94.348 kg)  Gen: wd, wn, nad Skin: warm, dry HENT unremarkable Lungs clear Heart rrr ,no murmur, normal s1, s2  Abdomen: +increased bowel sounds throughout, mild generalized tenderness, no mass, no organomegaly, no guarding or rebound Back: nontneder Ext: no edema Psych: pleasant, answered questions appropriately, affect as her usual self although she is not as cheerful as usual Neuro: moderate resting tremor, unchanged, CN2-12 intact   Assessment: Encounter Diagnoses  Name Primary?  . Generalized abdominal pain Yes  . Nausea without vomiting   . Chronic pain syndrome   . Moderate persistent asthma, uncomplicated     Plan: UA reviewed.  Abdominal pain possibly related to anxiety about the upcoming move to SNF.   No major exam findings today, no signs of UTI, no other worrisome symptoms.  Will use watch and wait approach.  Labs deferred today.  nausea - can use the standing  order OTC medication at the group for nausea prn.  Chronic pain - does fine on current medications.  Refilled medication today  Asthma - wrote new script for nebulizer for insurance purposes.  C/t albuterol prn, Pulmicort BID nebs for prevention.  Camree was seen today for stomach pain.  Diagnoses and all orders for this visit:  Generalized abdominal pain -     POCT urinalysis dipstick  Nausea without vomiting -     POCT urinalysis dipstick  Chronic pain syndrome -     HYDROcodone-acetaminophen (NORCO) 5-325 MG tablet; Take 1 tablet by mouth every 6 (six) hours as needed for moderate pain.  Moderate persistent asthma, uncomplicated

## 2015-11-27 ENCOUNTER — Ambulatory Visit: Payer: Self-pay | Admitting: Adult Health

## 2015-12-04 ENCOUNTER — Ambulatory Visit
Admission: RE | Admit: 2015-12-04 | Discharge: 2015-12-04 | Disposition: A | Discharge status: Home / Self Care | Payer: Medicare Other | Source: Ambulatory Visit

## 2015-12-04 ENCOUNTER — Ambulatory Visit
Admission: RE | Admit: 2015-12-04 | Discharge: 2015-12-04 | Disposition: A | Discharge status: Home / Self Care | Payer: Medicare Other | Source: Ambulatory Visit | Attending: Medical | Admitting: Medical

## 2015-12-04 DIAGNOSIS — M85852 Other specified disorders of bone density and structure, left thigh: Secondary | ICD-10-CM | POA: Diagnosis not present

## 2015-12-04 DIAGNOSIS — Z1231 Encounter for screening mammogram for malignant neoplasm of breast: Secondary | ICD-10-CM

## 2015-12-04 DIAGNOSIS — M8580 Other specified disorders of bone density and structure, unspecified site: Secondary | ICD-10-CM

## 2015-12-05 ENCOUNTER — Telehealth: Payer: Self-pay | Admitting: Medical

## 2015-12-05 DIAGNOSIS — M25561 Pain in right knee: Secondary | ICD-10-CM | POA: Diagnosis not present

## 2015-12-05 DIAGNOSIS — M1711 Unilateral primary osteoarthritis, right knee: Secondary | ICD-10-CM | POA: Diagnosis not present

## 2015-12-05 DIAGNOSIS — M1712 Unilateral primary osteoarthritis, left knee: Secondary | ICD-10-CM | POA: Diagnosis not present

## 2015-12-05 DIAGNOSIS — M25562 Pain in left knee: Secondary | ICD-10-CM | POA: Diagnosis not present

## 2015-12-05 NOTE — Telephone Encounter (Signed)
[name] dropped off pt new address and wanted to let everyone know that her address was going to be [facility], [address] [place]  [id number] in case someone wants to send her some mail!!!!!

## 2015-12-08 ENCOUNTER — Telehealth: Payer: Self-pay | Admitting: Medical

## 2015-12-08 NOTE — Telephone Encounter (Signed)
Recv'd letter from [employer] stating Ibandronate is now Non formulary drug & covered drug is Alendronate. Do you want to switch or try prior authorization?

## 2015-12-10 NOTE — Telephone Encounter (Signed)
No, when she comes in I believe this week, we will discuss doing injectable Prolia.

## 2015-12-12 NOTE — Telephone Encounter (Signed)
Initiated P.A. Ibandronate

## 2015-12-14 ENCOUNTER — Encounter: Payer: Self-pay | Admitting: Medical

## 2015-12-14 ENCOUNTER — Ambulatory Visit (INDEPENDENT_AMBULATORY_CARE_PROVIDER_SITE_OTHER): Payer: Medicare Other | Admitting: Medical

## 2015-12-14 VITALS — BP 110/70 | HR 67

## 2015-12-14 DIAGNOSIS — R32 Unspecified urinary incontinence: Secondary | ICD-10-CM

## 2015-12-14 DIAGNOSIS — F70 Mild intellectual disabilities: Secondary | ICD-10-CM | POA: Diagnosis not present

## 2015-12-14 DIAGNOSIS — Z7409 Other reduced mobility: Secondary | ICD-10-CM

## 2015-12-14 DIAGNOSIS — G894 Chronic pain syndrome: Secondary | ICD-10-CM | POA: Diagnosis not present

## 2015-12-14 DIAGNOSIS — E118 Type 2 diabetes mellitus with unspecified complications: Secondary | ICD-10-CM | POA: Diagnosis not present

## 2015-12-14 MED ORDER — HYDROCODONE-ACETAMINOPHEN 5-325 MG PO TABS: 1.0000 | ORAL_TABLET | Freq: Four times a day (QID) | ORAL | Status: DC | PRN

## 2015-12-14 MED ORDER — HYDROCODONE-ACETAMINOPHEN 5-325 MG PO TABS: 1.0000 | ORAL_TABLET | Freq: Two times a day (BID) | ORAL | Status: DC

## 2015-12-14 NOTE — Progress Notes (Signed)
Subjective: Chief Complaint  Patient presents with  . new facility    needs order for blood test. need wheelchair order for outings. order for adult briefs. needs labs done too. wants copy of bone density and mammogram. can not use prn for controlled substances like her hydrocodone   Here with Marrian Salvage, Health Service Coordinator with [facility] and Multimedia programmer of support staff.  Lattie Corns recently moved to a new SNF, small group home facility for more advanced care given [name]'s decline in health, frequency falls, impaired mobility over the last year.   She has been there about a week.   They need new paper orders for adult briefs, glucose testing 2 times per week, wheel chair use order, glucose testing supplies order, and Hydrocodone order,not prn.   She is handling the transition relatively well.  She is in good spirits today.  Here to discuss the recent bone density scan. No other aggravating or relieving factors. No other complaint.  Past Medical History  Diagnosis Date  . PVD (peripheral vascular disease) (HCC)   . Hypertension   . Diabetes mellitus   . GERD (gastroesophageal reflux disease)   . Hx of deep venous thrombosis     lifelong anticoagulant therapy  . Constipation     mild intermittent  . Chronic kidney disease   . Thrombocytopenia (HCC) 10/2010    due to medications and immune dysregulation likely, stable as of 2015; no further investigation; hematology, Dr. Arline Asp  . Allergy   . Mild mental retardation   . Mood disorder (HCC)     [facility] Lolita Cram  . Falls   . Diabetes type 2, controlled (HCC)   . Long term current use of anticoagulant therapy     due to hx/o recurrent DVT  . Tremor 2015    consult with Dr. Mindi Slicker Neurology.  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 Depression     [facility] Lolita Cram  . Edentulous   . Osteoarthritis of both knees     Dr. Gean Birchwood  .  Hypertrophic obstructive cardiomyopathy (HOCM) (HCC) 2015    normal echo and stress test other than mild LVH 06/2014; Dr. Dietrich Pates  . H/O echocardiogram 06/17/14    mild LVH, EF 60-65%, no wall motion abnormalities  . H/O cardiovascular stress test 06/2014    nuclear stress test normal; Dr. Huston Foley  . Shortness of breath     06/2014 cardiac consult, SOB seems to be related to poor technique with handheld inhaler, switched to nebs with much improvement  . Osteopenia 2013    improvements on Boniva and Ca+D from 2011-2013.  2013 Bone Density normal /improved; repeat Bone Density scan 2016  . Anemia of chronic disease 10/2010    stable as of 2015, on iron therapy for mild iron deficiency, chronic kidney disease, anemia of chronic disease; Dr. Arline Asp prior consult  . H/O mammogram 08/04/2014    normal  . Hyperlipidemia   . History of MRI of brain and brain stem 11/2010    normal MRI of brain  . Moderate persistent asthma     asthma and bronchiectasis, pulm consult 2015; unable to do PFTs, 2015   ROS as in subjective   Objective: BP 110/70 mmHg  Pulse 67  General appearance: alert, no distress, WD/WN    Assessment: Encounter Diagnoses  Name Primary?  . Incontinence Yes  . Chronic pain syndrome   . Impaired mobility   .  Mild mental retardation   . Type 2 diabetes mellitus with complication, without long-term current use of insulin (HCC)     Plan: We discussed [name]'s overall state of health, her typical problems that bring her in.  Advised twice yearly visits in general, prn otherwise.    Gave scripts for adult briefs, glucose testing 2 times per week, wheel chair use, glucometer testing supplies and hydrocodone 28mo supply.  Discussed her recent bone density results.  Will check into Prolia coverage as she has failed Fosamax and Boniva.  We will call back with Prolia info.

## 2015-12-14 NOTE — Telephone Encounter (Signed)
P.A. IBANDRONATE approved til 12/12/2016, provider informed

## 2015-12-14 NOTE — Telephone Encounter (Signed)
Approved.  

## 2015-12-18 ENCOUNTER — Other Ambulatory Visit (INDEPENDENT_AMBULATORY_CARE_PROVIDER_SITE_OTHER): Payer: Medicare Other

## 2015-12-18 DIAGNOSIS — Z111 Encounter for screening for respiratory tuberculosis: Secondary | ICD-10-CM

## 2015-12-19 DIAGNOSIS — F329 Major depressive disorder, single episode, unspecified: Secondary | ICD-10-CM | POA: Diagnosis not present

## 2015-12-19 DIAGNOSIS — F71 Moderate intellectual disabilities: Secondary | ICD-10-CM | POA: Diagnosis not present

## 2015-12-19 DIAGNOSIS — F4322 Adjustment disorder with anxiety: Secondary | ICD-10-CM | POA: Diagnosis not present

## 2015-12-19 NOTE — Telephone Encounter (Signed)
Also Rep is supposed to come by and talk with me and [name] this week

## 2015-12-19 NOTE — Telephone Encounter (Signed)
pls check with [name] on the Prolia rep, I want to call them to get this ordered on [name]

## 2015-12-19 NOTE — Telephone Encounter (Signed)
Sure the Rep's name is Montel Clock t# [phone]

## 2015-12-19 NOTE — Telephone Encounter (Signed)
Can you send the drug reps infoormation to shane please

## 2015-12-19 NOTE — Telephone Encounter (Signed)
[name] attached reps name and contact info

## 2015-12-20 LAB — TB SKIN TEST
INDURATION: 0 mm
TB SKIN TEST: NEGATIVE

## 2015-12-22 NOTE — Telephone Encounter (Signed)
Order for Prolia has been placed

## 2016-01-02 ENCOUNTER — Telehealth: Payer: Self-pay | Admitting: Medical

## 2016-01-02 NOTE — Telephone Encounter (Signed)
Called Medicaid t#  [phone] to verify coverage of Prolia and they state that it requires P.A. And trial of at least 2 preferred meds,  Fortical nasal spray, Alendronate (Fosamax) and Raloxifene tabs (Evista) Pulled paper chart and pt has tried Fosamax 09/03/10 and [employer] Rep & informed, she will come by and discuss with Vincenza Hews on Wednesday

## 2016-01-02 NOTE — Telephone Encounter (Signed)
Called RN Mayo Ao and left message regarding Prolia

## 2016-01-03 NOTE — Telephone Encounter (Signed)
Prolia rep is coming back tomorrow to talk to [place] both

## 2016-01-04 ENCOUNTER — Telehealth: Payer: Self-pay | Admitting: Medical

## 2016-01-04 NOTE — Telephone Encounter (Signed)
French Ana from [employer] called and states that they need shower chair and a raised commode seat for [name] since she is so tall she is having a hard time sitting on the toilet. French Ana can be reached at [phone]

## 2016-01-04 NOTE — Telephone Encounter (Signed)
pls write scripts for each and i'll sign

## 2016-01-04 NOTE — Telephone Encounter (Signed)
Rep mentioned writing a script for prolia and having the facilty take the script to the pharmacy to see how much it will actually be. She stated it should be 6 dollars. But in case it isnt she doesn't want [place] to send it electronically. If it does go through they should not pick it up until they can come in

## 2016-01-04 NOTE — Telephone Encounter (Signed)
French Ana from [employer] called back and wanted to talk to laura, i told her you would call back when you returned to the office, she can be reached at [phone].

## 2016-01-04 NOTE — Telephone Encounter (Signed)
Done and notified her facility

## 2016-01-05 ENCOUNTER — Encounter: Payer: Self-pay | Admitting: *Deleted

## 2016-01-05 NOTE — Telephone Encounter (Signed)
[facility] is aware and picking up this morning

## 2016-01-05 NOTE — Telephone Encounter (Signed)
rx ready 

## 2016-01-11 ENCOUNTER — Other Ambulatory Visit (INDEPENDENT_AMBULATORY_CARE_PROVIDER_SITE_OTHER): Payer: Medicare Other

## 2016-01-11 DIAGNOSIS — M8580 Other specified disorders of bone density and structure, unspecified site: Secondary | ICD-10-CM

## 2016-01-11 MED ORDER — DENOSUMAB 60 MG/ML ~~LOC~~ SOLN
60.0000 mg | Freq: Once | SUBCUTANEOUS | Status: AC
Start: 2016-01-11 — End: 2016-01-11
  Administered 2016-01-11: 60 mg via SUBCUTANEOUS

## 2016-01-11 NOTE — Telephone Encounter (Signed)
[name] states Rx Prolia at pharmacy didn't cost anything.  Pt recv'd injection today at this office

## 2016-01-17 ENCOUNTER — Telehealth: Payer: Self-pay | Admitting: Medical

## 2016-01-17 NOTE — Telephone Encounter (Signed)
Left on Traceys VM

## 2016-01-17 NOTE — Telephone Encounter (Signed)
Please make sure now that [name] has begun Prolia injection every 6 months, she needs to stop the Tower.   So please make sure they know to D/C Boniva monthly tablet And ask them to write down in their calendar to call us in 5 months to remind [place] to order next Prolia injection

## 2016-01-22 ENCOUNTER — Other Ambulatory Visit: Payer: Self-pay | Admitting: Medical

## 2016-01-22 ENCOUNTER — Telehealth: Payer: Self-pay | Admitting: Family Medicine

## 2016-01-22 MED ORDER — ALBUTEROL SULFATE (2.5 MG/3ML) 0.083% IN NEBU: 2.5000 mg | INHALATION_SOLUTION | Freq: Four times a day (QID) | RESPIRATORY_TRACT | Status: DC | PRN

## 2016-01-22 NOTE — Telephone Encounter (Signed)
-   Albuterol refilled.

## 2016-01-22 NOTE — Telephone Encounter (Signed)
[facility] req refill for [employer] .083%     60 vial    180 ml

## 2016-01-22 NOTE — Telephone Encounter (Signed)
ok 

## 2016-01-23 ENCOUNTER — Other Ambulatory Visit: Payer: Self-pay | Admitting: Family Medicine

## 2016-01-23 ENCOUNTER — Telehealth: Payer: Self-pay | Admitting: Family Medicine

## 2016-01-23 DIAGNOSIS — F6381 Intermittent explosive disorder: Secondary | ICD-10-CM | POA: Diagnosis not present

## 2016-01-23 DIAGNOSIS — F70 Mild intellectual disabilities: Secondary | ICD-10-CM | POA: Diagnosis not present

## 2016-01-23 DIAGNOSIS — F3189 Other bipolar disorder: Secondary | ICD-10-CM | POA: Diagnosis not present

## 2016-01-23 MED ORDER — ALBUTEROL SULFATE (2.5 MG/3ML) 0.083% IN NEBU: 2.5000 mg | INHALATION_SOLUTION | Freq: Four times a day (QID) | RESPIRATORY_TRACT | Status: DC | PRN

## 2016-01-23 NOTE — Telephone Encounter (Signed)
Nurse came in and needed urgent refill to [name] for Albuterol.  Gave refill until they could get mail order.

## 2016-01-30 ENCOUNTER — Telehealth: Payer: Self-pay

## 2016-01-30 NOTE — Telephone Encounter (Signed)
Spoke to [employer] and they stated the fax was because her sugar got down to 68 on the morning of 3/30 and the pt had refused breakfast but her sugar came back up at lunch time. Sine the guidelines where over 200 or less then 70 to notify you that is why they faxed the form over.

## 2016-01-30 NOTE — Telephone Encounter (Signed)
Ok, it wasn't clear why I received the message.   Thanks for the FYI, c/t what they are doing and encourage her not to skip meals.

## 2016-01-30 NOTE — Telephone Encounter (Signed)
done

## 2016-02-01 ENCOUNTER — Telehealth: Payer: Self-pay | Admitting: Medical

## 2016-02-01 NOTE — Telephone Encounter (Signed)
Faxed rx and left a voicemail explaining this to [employer]

## 2016-02-01 NOTE — Telephone Encounter (Signed)
Get the Rx for glucerna to the nursing facility but advise them to take her to see her psychiatrist ASAP.  Its not like her to refuse 50-80% of meals.   Thus, I suspect she is having some emotional difficulty influencing her lack of appetite.

## 2016-02-08 ENCOUNTER — Other Ambulatory Visit: Payer: Self-pay

## 2016-02-08 MED ORDER — ALBUTEROL SULFATE (2.5 MG/3ML) 0.083% IN NEBU: 2.5000 mg | INHALATION_SOLUTION | Freq: Four times a day (QID) | RESPIRATORY_TRACT | Status: DC | PRN

## 2016-02-13 ENCOUNTER — Ambulatory Visit (INDEPENDENT_AMBULATORY_CARE_PROVIDER_SITE_OTHER): Payer: Medicare Other | Admitting: Medical

## 2016-02-13 ENCOUNTER — Encounter: Payer: Self-pay | Admitting: Medical

## 2016-02-13 ENCOUNTER — Ambulatory Visit
Admission: RE | Admit: 2016-02-13 | Discharge: 2016-02-13 | Disposition: A | Discharge status: Home / Self Care | Payer: Medicare Other | Source: Ambulatory Visit | Attending: Medical | Admitting: Medical

## 2016-02-13 VITALS — BP 120/80 | HR 80 | Wt 181.0 lb

## 2016-02-13 DIAGNOSIS — E118 Type 2 diabetes mellitus with unspecified complications: Secondary | ICD-10-CM | POA: Diagnosis not present

## 2016-02-13 DIAGNOSIS — M7989 Other specified soft tissue disorders: Secondary | ICD-10-CM

## 2016-02-13 DIAGNOSIS — R634 Abnormal weight loss: Secondary | ICD-10-CM

## 2016-02-13 DIAGNOSIS — D692 Other nonthrombocytopenic purpura: Secondary | ICD-10-CM

## 2016-02-13 DIAGNOSIS — R630 Anorexia: Secondary | ICD-10-CM

## 2016-02-13 DIAGNOSIS — S60221A Contusion of right hand, initial encounter: Secondary | ICD-10-CM | POA: Diagnosis not present

## 2016-02-13 NOTE — Progress Notes (Signed)
Subjective: Chief Complaint  Patient presents with  . bruise on hand    rt hand has a bruise and was swollen, the swelling has gone down. the facility thinks it was from hitting door frames with her new walker   Here with Alfonse Flavors , Health Service Coordinator with Clinical cytogeneticist of support staff.  Here for right hand bruising and swelling.  She nor staff are sure when the bruise appear as she has senile purpura and other bruises as well, but the hand has been swollen the last few days.   they deny a fall or injury.   Jeneatte denies injury but says she doesn't feel good in general.  She has not been eating all that well, and recently the home called in asking for approval of ensure to supplement as she has been refusing meals.  She saw psychiatry end of March.  No recent medication changes.  She hasn't been all that active in classes or activities during the day.    She denies nausea, vomiting, bleeding, fever, loose stool or urinary issues.   No other aggravating or relieving factors. No other complaint.  ROS as in subjective   Objective: BP 120/80 mmHg  Pulse 80  Wt 181 lb (82.101 kg)  Wt Readings from Last 3 Encounters:  02/13/16 181 lb (82.101 kg)  11/24/15 208 lb (94.348 kg)  09/11/15 204 lb (92.534 kg)    General appearance: alert, no distress, WD/WN Right dorsal hand with moderate area of purplish ecchymosis over 2nd -4th fingers at MCP to mid metacarpals, slight swelling in same area, but seemingly normal ROM of fingers.   Finger ROM seems to be fine and relatively full, but she notes tenderness anywhere I touch along right hand and arm throughout without obvious deformity.  There is a patch of brownish coloration of left elbow suggesting healing bruise, similar brown patches throughout right lateral forearm, there is purplish patch of purpura along left distal 1/3 of forearm and right distal 1/3 of forearm HENT unremarkable Abdomen: nontender, no mass, no  organomegaly Lungs clear Heart rrr, normal s1, s2, no murmurs Pulses: 2+ Ext: no edema    Assessment: Encounter Diagnoses  Name Primary?  . Swelling of right hand Yes  . Loss of weight   . Appetite absent   . Senile purpura (HCC)   . Type 2 diabetes mellitus with complication, without long-term current use of insulin (HCC)      Plan: Hand bruising likely represents senile purpura, but given the swelling will send for xray to rule out fracture.  Labs today in light of appetite decrease and weight loss noted. I suspect this is all related to her difficulty handling the move from her old group home to the new SNF.  advised f/u with psychiatry.  F/u pending labs.  encouraged her to try and eat some of each meal and to make friends and get more active in the programs that are offered.

## 2016-02-14 LAB — COMPREHENSIVE METABOLIC PANEL
ALBUMIN: 3.5 g/dL — AB (ref 3.6–5.1)
ALT: 6 U/L (ref 6–29)
AST: 11 U/L (ref 10–35)
Alkaline Phosphatase: 69 U/L (ref 33–130)
BUN: 41 mg/dL — AB (ref 7–25)
CHLORIDE: 101 mmol/L (ref 98–110)
CO2: 28 mmol/L (ref 20–31)
CREATININE: 1.65 mg/dL — AB (ref 0.60–0.93)
Calcium: 8.9 mg/dL (ref 8.6–10.4)
GLUCOSE: 171 mg/dL — AB (ref 65–99)
Potassium: 4.9 mmol/L (ref 3.5–5.3)
SODIUM: 138 mmol/L (ref 135–146)
Total Bilirubin: 0.3 mg/dL (ref 0.2–1.2)
Total Protein: 5.9 g/dL — ABNORMAL LOW (ref 6.1–8.1)

## 2016-02-14 LAB — CBC WITH DIFFERENTIAL/PLATELET
BASOS PCT: 0 %
Basophils Absolute: 0 cells/uL (ref 0–200)
Eosinophils Absolute: 73 cells/uL (ref 15–500)
Eosinophils Relative: 1 %
HEMATOCRIT: 33.3 % — AB (ref 35.0–45.0)
Hemoglobin: 10.9 g/dL — ABNORMAL LOW (ref 11.7–15.5)
LYMPHS PCT: 13 %
Lymphs Abs: 949 cells/uL (ref 850–3900)
MCH: 29.9 pg (ref 27.0–33.0)
MCHC: 32.7 g/dL (ref 32.0–36.0)
MCV: 91.5 fL (ref 80.0–100.0)
MONO ABS: 730 {cells}/uL (ref 200–950)
MPV: 10.8 fL (ref 7.5–12.5)
Monocytes Relative: 10 %
Neutro Abs: 5548 cells/uL (ref 1500–7800)
Neutrophils Relative %: 76 %
PLATELETS: 164 10*3/uL (ref 140–400)
RBC: 3.64 MIL/uL — AB (ref 3.80–5.10)
RDW: 13.1 % (ref 11.0–15.0)
WBC: 7.3 10*3/uL (ref 4.0–10.5)

## 2016-02-14 LAB — HEMOGLOBIN A1C
HEMOGLOBIN A1C: 6.6 % — AB (ref <5.7)
MEAN PLASMA GLUCOSE: 143 mg/dL

## 2016-02-15 DIAGNOSIS — H26493 Other secondary cataract, bilateral: Secondary | ICD-10-CM | POA: Diagnosis not present

## 2016-02-15 DIAGNOSIS — E119 Type 2 diabetes mellitus without complications: Secondary | ICD-10-CM | POA: Diagnosis not present

## 2016-02-22 ENCOUNTER — Telehealth: Payer: Self-pay | Admitting: Medical

## 2016-02-22 NOTE — Telephone Encounter (Signed)
Is this ok to refill?  

## 2016-02-22 NOTE — Telephone Encounter (Signed)
Rcvd refill request for scripts on Budesonide 0.5 mg solution #60 ml & Albuterol 0.083 % 60 vial #180 ml be sent to [facility]

## 2016-02-23 ENCOUNTER — Other Ambulatory Visit: Payer: Self-pay | Admitting: Medical

## 2016-02-23 DIAGNOSIS — J4540 Moderate persistent asthma, uncomplicated: Secondary | ICD-10-CM

## 2016-02-23 NOTE — Telephone Encounter (Signed)
Called over to facility and person that I need to speak to was not in-they took a message and will have her call me back.

## 2016-02-23 NOTE — Telephone Encounter (Signed)
Called back to follow up as no one returned my call and they were closed. Hours are 9-3pm.

## 2016-02-23 NOTE — Telephone Encounter (Signed)
I wrote for year refill in January, so she shouldn't need refills.   [facility] says [place], [place], is this correct pharmacy?

## 2016-02-26 NOTE — Telephone Encounter (Signed)
When calling about her care ask for [facility]. She is calling the pharmacy to get to the bottom of it

## 2016-03-06 ENCOUNTER — Other Ambulatory Visit: Payer: Self-pay | Admitting: Medical

## 2016-03-06 DIAGNOSIS — G894 Chronic pain syndrome: Secondary | ICD-10-CM

## 2016-03-06 MED ORDER — HYDROCODONE-ACETAMINOPHEN 5-325 MG PO TABS: 1.0000 | ORAL_TABLET | Freq: Two times a day (BID) | ORAL | Status: DC

## 2016-03-06 NOTE — Telephone Encounter (Signed)
Is this ok to refill?  

## 2016-03-13 ENCOUNTER — Telehealth: Payer: Self-pay

## 2016-03-13 NOTE — Telephone Encounter (Signed)
Attempted call to number again. Unable to leave message. I am not in the office tomorrow to f/u on this.   Original incorrect # is [phone] Center- [phone] opened 9-3.  Martie Lee, Can you please help me with this?   Thank you, Lurena Joiner

## 2016-03-13 NOTE — Telephone Encounter (Signed)
Call from after hours service for pt, attempted call to number provided by answering service- this is incorrect.  Attempted call to number in chart with no option to leave VCM, business closes at 3pm.    Original message from answering service- blood in urine/ bottom is red. Trixie Rude

## 2016-03-13 NOTE — Telephone Encounter (Signed)
She will need to come in for an appointment

## 2016-03-14 NOTE — Telephone Encounter (Signed)
Pt is coming in tomorrow to see Dr. Susann Givens

## 2016-03-15 ENCOUNTER — Ambulatory Visit (INDEPENDENT_AMBULATORY_CARE_PROVIDER_SITE_OTHER): Payer: Medicare Other | Admitting: Family Medicine

## 2016-03-15 ENCOUNTER — Encounter: Payer: Self-pay | Admitting: Family Medicine

## 2016-03-15 VITALS — BP 124/58 | HR 63 | Temp 98.5°F | Wt 163.0 lb

## 2016-03-15 DIAGNOSIS — N390 Urinary tract infection, site not specified: Secondary | ICD-10-CM

## 2016-03-15 DIAGNOSIS — R319 Hematuria, unspecified: Secondary | ICD-10-CM | POA: Diagnosis not present

## 2016-03-15 LAB — POCT URINALYSIS DIPSTICK
BILIRUBIN UA: NEGATIVE
GLUCOSE UA: NEGATIVE
KETONES UA: NEGATIVE
Leukocytes, UA: NEGATIVE
Nitrite, UA: POSITIVE
Protein, UA: NEGATIVE
RBC UA: NEGATIVE
Urobilinogen, UA: NEGATIVE
pH, UA: 6

## 2016-03-15 MED ORDER — SULFAMETHOXAZOLE-TRIMETHOPRIM 800-160 MG PO TABS: 1.0000 | ORAL_TABLET | Freq: Two times a day (BID) | ORAL | Status: DC

## 2016-03-15 NOTE — Progress Notes (Signed)
   Subjective:    Patient ID: [name], female    DOB: June 23, 1935, 81 y.o.   MRN: [id number]  HPI  She is brought in by a staff member for evaluation of noting blood in her urine. She does use adult diapers usually changing them 3 times per day. She apparently tells them when they're in need to be changed.  Not complaint of fever, chills, urgency.   Review of Systems     Objective:   Physical Exam  alert and in no distress. Dipstick positive. Microscopic did show multiple rods as well as scattered epithelial cells.       Assessment & Plan:  UTI (lower urinary tract infection) - Plan: sulfamethoxazole-trimethoprim (BACTRIM DS,SEPTRA DS) 800-160 MG tablet  Hematuria - Plan: POCT Urinalysis Dipstick, sulfamethoxazole-trimethoprim (BACTRIM DS,SEPTRA DS) 800-160 MG tablet  They will call if further difficulty.

## 2016-03-20 DIAGNOSIS — M170 Bilateral primary osteoarthritis of knee: Secondary | ICD-10-CM | POA: Diagnosis not present

## 2016-05-01 ENCOUNTER — Encounter: Payer: Self-pay | Admitting: Medical

## 2016-05-01 ENCOUNTER — Ambulatory Visit (INDEPENDENT_AMBULATORY_CARE_PROVIDER_SITE_OTHER): Payer: Medicare Other | Admitting: Medical

## 2016-05-01 VITALS — BP 100/50 | HR 78 | Temp 97.8°F | Wt 182.0 lb

## 2016-05-01 DIAGNOSIS — J069 Acute upper respiratory infection, unspecified: Secondary | ICD-10-CM | POA: Diagnosis not present

## 2016-05-01 DIAGNOSIS — W19XXXA Unspecified fall, initial encounter: Secondary | ICD-10-CM | POA: Diagnosis not present

## 2016-05-01 DIAGNOSIS — R05 Cough: Secondary | ICD-10-CM

## 2016-05-01 DIAGNOSIS — R059 Cough, unspecified: Secondary | ICD-10-CM

## 2016-05-01 NOTE — Patient Instructions (Signed)
Recommendations: Her symptoms suggest a viral cold/ viral respiratory tract infection   Continue the twice daily Pulmicort nebulizer treatments as usual  From now through Saturday, have her use the Albuterol nebulized treatment twice daily.  After Saturday, she can use Albuterol as needed.    Albuterol can help with shortness of breath and cough spells  Begin the Tussin OTC for cough as labeled.  This should be a standing order at your facility  Have her hydrate well with water throughout the day  If not improving or worse by Friday, particular if fever, not eating, or overall worse, then call back

## 2016-05-01 NOTE — Progress Notes (Addendum)
Subjective: Chief Complaint  Patient presents with  . Cough    started friday. no production. has a rattle to it   Here for cough.  accompanied by caregiver.  Having cough since last Friday 5 days ago. No fever.  Has rattle in cough.   No sneezing.   No sore throat, no ear pain, no sneezing, congestion, vomiting.  No productive cough.  No wheezing.   Still doing pulmicort BID.   Has been around a few sick contacts.  Eating is ok.   Drinking plenty of fluid.  hans 't been using the Tussin standing scrip or albuterol.    Regarding noticeable bruising on left forearm today, she did note having a recent fall.  Date of injury maybe a week ago.  No current pain or imitations today but does have bruising left forearm  No other aggravating or relieving factors. No other complaint.  Past Medical History  Diagnosis Date  . PVD (peripheral vascular disease) (HCC)   . Hypertension   . Diabetes mellitus   . GERD (gastroesophageal reflux disease)   . Hx of deep venous thrombosis     lifelong anticoagulant therapy  . Constipation     mild intermittent  . Chronic kidney disease   . Thrombocytopenia (HCC) 10/2010    due to medications and immune dysregulation likely, stable as of 2015; no further investigation; hematology, Dr. Arline Asp  . Allergy   . Mild mental retardation   . Mood disorder (HCC)     [facility] - NP Lolita Cram  . Falls   . Diabetes type 2, controlled (HCC)   . Long term current use of anticoagulant therapy     due to hx/o recurrent DVT  . Tremor 2015    consult with Dr. Mindi Slicker Neurology.  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 Depression     [facility] - NP Lolita Cram  . Edentulous   . Osteoarthritis of both knees     Dr. Gean Birchwood  . Hypertrophic obstructive cardiomyopathy (HOCM) (HCC) 2015    normal echo and stress test other than mild LVH 06/2014; Dr. Dietrich Pates  . H/O echocardiogram 06/17/14    mild LVH, EF 60-65%, no wall  motion abnormalities  . H/O cardiovascular stress test 06/2014    nuclear stress test normal; Dr. Huston Foley  . Shortness of breath     06/2014 cardiac consult, SOB seems to be related to poor technique with handheld inhaler, switched to nebs with much improvement  . Osteopenia 2013    improvements on Boniva and Ca+D from 2011-2013.  2013 Bone Density normal /improved; repeat Bone Density scan 2016  . Anemia of chronic disease 10/2010    stable as of 2015, on iron therapy for mild iron deficiency, chronic kidney disease, anemia of chronic disease; Dr. Arline Asp prior consult  . H/O mammogram 08/04/2014    normal  . Hyperlipidemia   . History of MRI of brain and brain stem 11/2010    normal MRI of brain  . Moderate persistent asthma     asthma and bronchiectasis, pulm consult 2015; unable to do PFTs, 2015   Current Outpatient Prescriptions on File Prior to Visit  Medication Sig Dispense Refill  . ACCU-CHEK AVIVA PLUS test strip CHECK BLOOD GLUCOSE (SUGAR) 1 OR 2 TIMESA WEEK 100 each 2  . albuterol (PROVENTIL) (2.5 MG/3ML) 0.083% nebulizer solution Take 3 mLs (2.5 mg total) by nebulization every 6 (six) hours as needed for wheezing or  shortness of breath (dx J47.9    I42.2). 360 mL 5  . buPROPion (WELLBUTRIN SR) 150 MG 12 hr tablet Take 150 mg by mouth daily after breakfast.      . Calcium Carbonate-Vit D-Min (CALCIUM 1200) 1200-1000 MG-UNIT CHEW Chew 1,000 mg by mouth once. 90 each 3  . divalproex (DEPAKOTE) 250 MG DR tablet Take 500 mg by mouth 2 (two) times daily.     Marland Kitchen docusate sodium (COLACE) 100 MG capsule Take 1 capsule (100 mg total) by mouth 2 (two) times daily. Prn 60 capsule 11  . ferrous sulfate 325 (65 FE) MG tablet Take 1 tablet (325 mg total) by mouth daily with breakfast. 90 tablet 3  . FLUoxetine (PROZAC) 10 MG capsule Take 10 mg by mouth daily. 10mg  plus 40 mg am    . FLUoxetine (PROZAC) 40 MG capsule Take 40 mg by mouth daily.      . fluticasone (FLONASE) 50 MCG/ACT nasal spray  Place 2 sprays into both nostrils daily. 16 g 11  . furosemide (LASIX) 40 MG tablet Take 0.5 tablets (20 mg total) by mouth daily. 90 tablet 3  . HYDROcodone-acetaminophen (NORCO) 5-325 MG tablet Take 1 tablet by mouth 2 (two) times daily. 180 tablet 0  . isosorbide mononitrate (IMDUR) 30 MG 24 hr tablet Take 1 tablet (30 mg total) by mouth daily. 90 tablet 3  . loratadine (QC LORATADINE ALLERGY RELIEF) 10 MG tablet Take 1 tablet (10 mg total) by mouth daily. 90 tablet 3  . LORazepam (ATIVAN) 0.5 MG tablet Take 0.5 mg by mouth 2 (two) times daily. And as needed    . metoCLOPramide (REGLAN) 5 MG tablet Take 1 tablet (5 mg total) by mouth daily. 90 tablet 3  . metoprolol tartrate (LOPRESSOR) 25 MG tablet Take 1 tablet (25 mg total) by mouth 2 (two) times daily. 180 tablet 3  . montelukast (SINGULAIR) 10 MG tablet Take 1 tablet (10 mg total) by mouth at bedtime. 90 tablet 3  . omeprazole (PRILOSEC) 20 MG capsule Take 1 capsule (20 mg total) by mouth daily. 90 capsule 3  . potassium chloride (K-DUR) 10 MEQ tablet Take 1 tablet (10 mEq total) by mouth daily. 90 tablet 3  . PRODIGY TWIST TOP LANCETS 28G MISC TWICE weekly 100 each 2  . rivaroxaban (XARELTO) 20 MG TABS tablet Take 1 tablet (20 mg total) by mouth daily with supper. 90 tablet 3  . simvastatin (ZOCOR) 20 MG tablet Take 1 tablet (20 mg total) by mouth daily at 6 PM. 90 tablet 3  . sulfamethoxazole-trimethoprim (BACTRIM DS,SEPTRA DS) 800-160 MG tablet Take 1 tablet by mouth 2 (two) times daily. 20 tablet 0  . budesonide (PULMICORT) 0.5 MG/2ML nebulizer solution TAKE 2 ml (0.5mg ) BY NEBULIZER TWICE DAILY (Patient not taking: Reported on 05/01/2016) 120 mL 11   No current facility-administered medications on file prior to visit.    Objective: BP 100/50 mmHg  Pulse 78  Temp(Src) 97.8 F (36.6 C) (Tympanic)  Wt 182 lb (82.555 kg)  General appearance: alert, no distress, WD/WN HEENT: normocephalic, sclerae anicteric, TMs pearly, nares  patent, no discharge or erythema, pharynx normal Oral cavity: somewhat dry MM, no lesions Neck: supple, no lymphadenopathy, no thyromegaly, no masses Heart: RRR, normal S1, S2, no murmurs Lungs: CTA bilaterally, no wheezes, rhonchi, or rales Pulses: 1+ symmetric, upper and lower extremities, normal cap refill Ext: no edema Skin: mild healing bruising of left dorsal forearm distally   Assessment: Encounter Diagnoses  Name Primary?  Marland Kitchen  Cough Yes  . Viral URI   . Fall, initial encounter      Plan: discussed symptoms, exam findings.  Gave recommendations below, and call/return if not seeing improvement in the next 3 days.   No obvious MSK injury needing xray today.  Bruising will health with time as discussed and call if any other problems.  discussed fall prevention  Patient Instructions  Recommendations: Her symptoms suggest a viral cold/ viral respiratory tract infection   Continue the twice daily Pulmicort nebulizer treatments as usual  From now through Saturday, have her use the Albuterol nebulized treatment twice daily.  After Saturday, she can use Albuterol as needed.    Albuterol can help with shortness of breath and cough spells  Begin the Tussin OTC for cough as labeled.  This should be a standing order at your facility  Have her hydrate well with water throughout the day  If not improving or worse by Friday, particular if fever, not eating, or overall worse, then call back

## 2016-05-08 DIAGNOSIS — E119 Type 2 diabetes mellitus without complications: Secondary | ICD-10-CM | POA: Diagnosis not present

## 2016-05-08 DIAGNOSIS — H04123 Dry eye syndrome of bilateral lacrimal glands: Secondary | ICD-10-CM | POA: Diagnosis not present

## 2016-05-08 DIAGNOSIS — Z961 Presence of intraocular lens: Secondary | ICD-10-CM | POA: Diagnosis not present

## 2016-05-19 IMAGING — CR DG HAND COMPLETE 3+V*R*
3 series · 3 of 3 positions shown · non-contrast
Comparison: None.

CLINICAL DATA: Pain following fall 3 days prior

EXAM:
RIGHT HAND - COMPLETE 3+ VIEW

[x hand pa right]
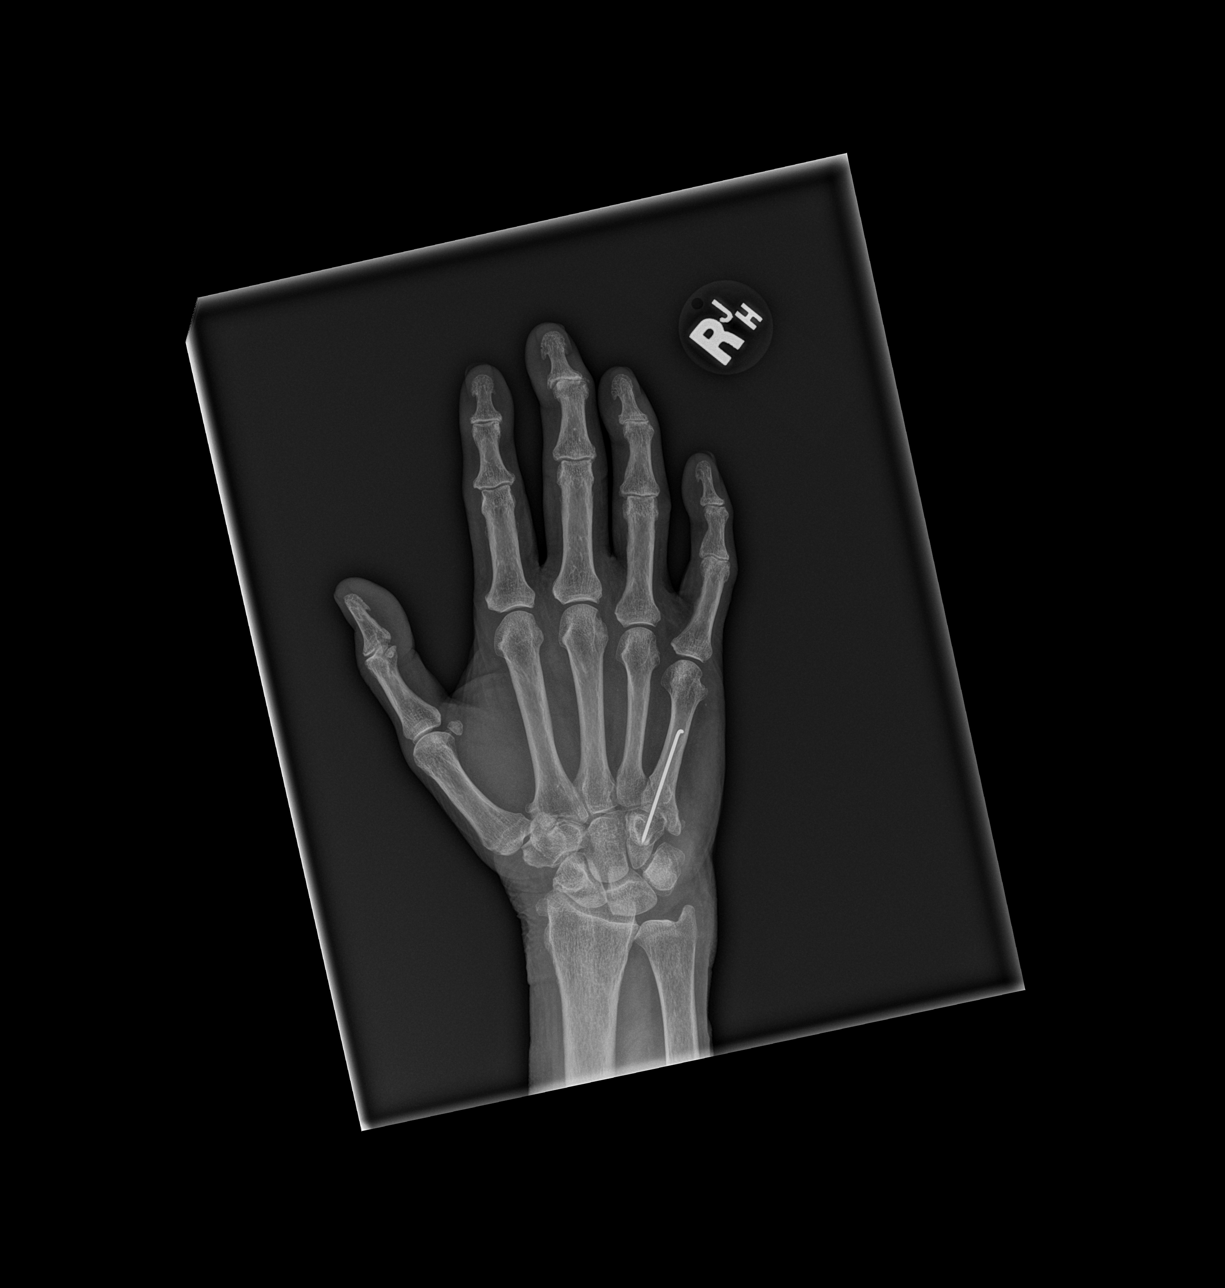

[x hand obl right]
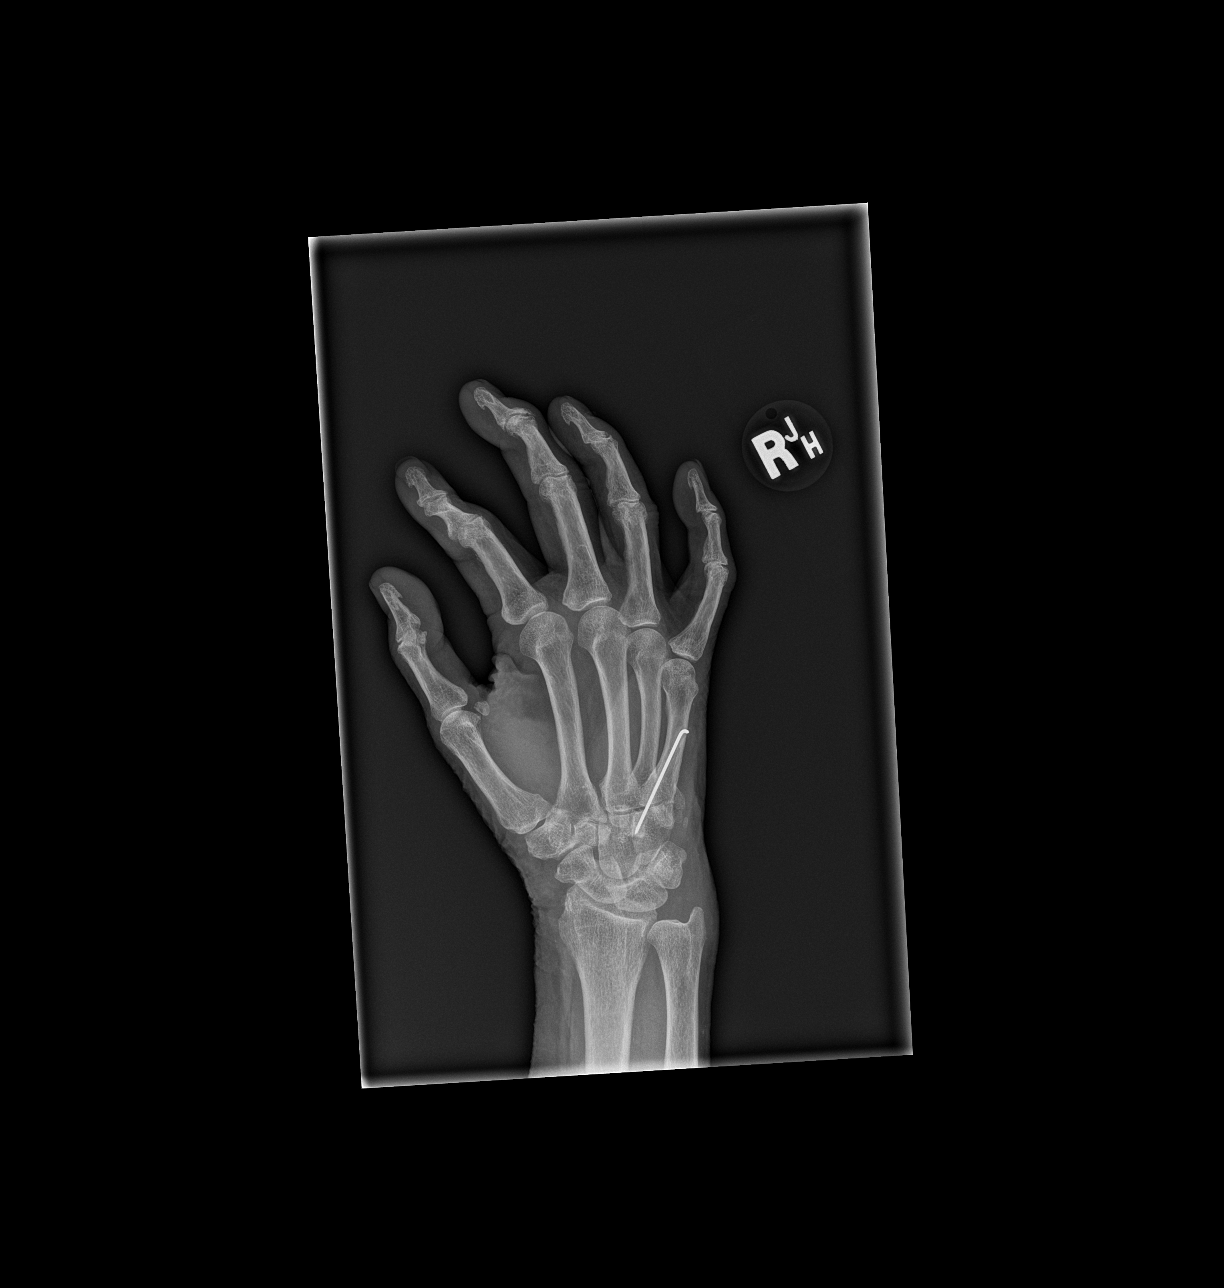

[x hand lat right]
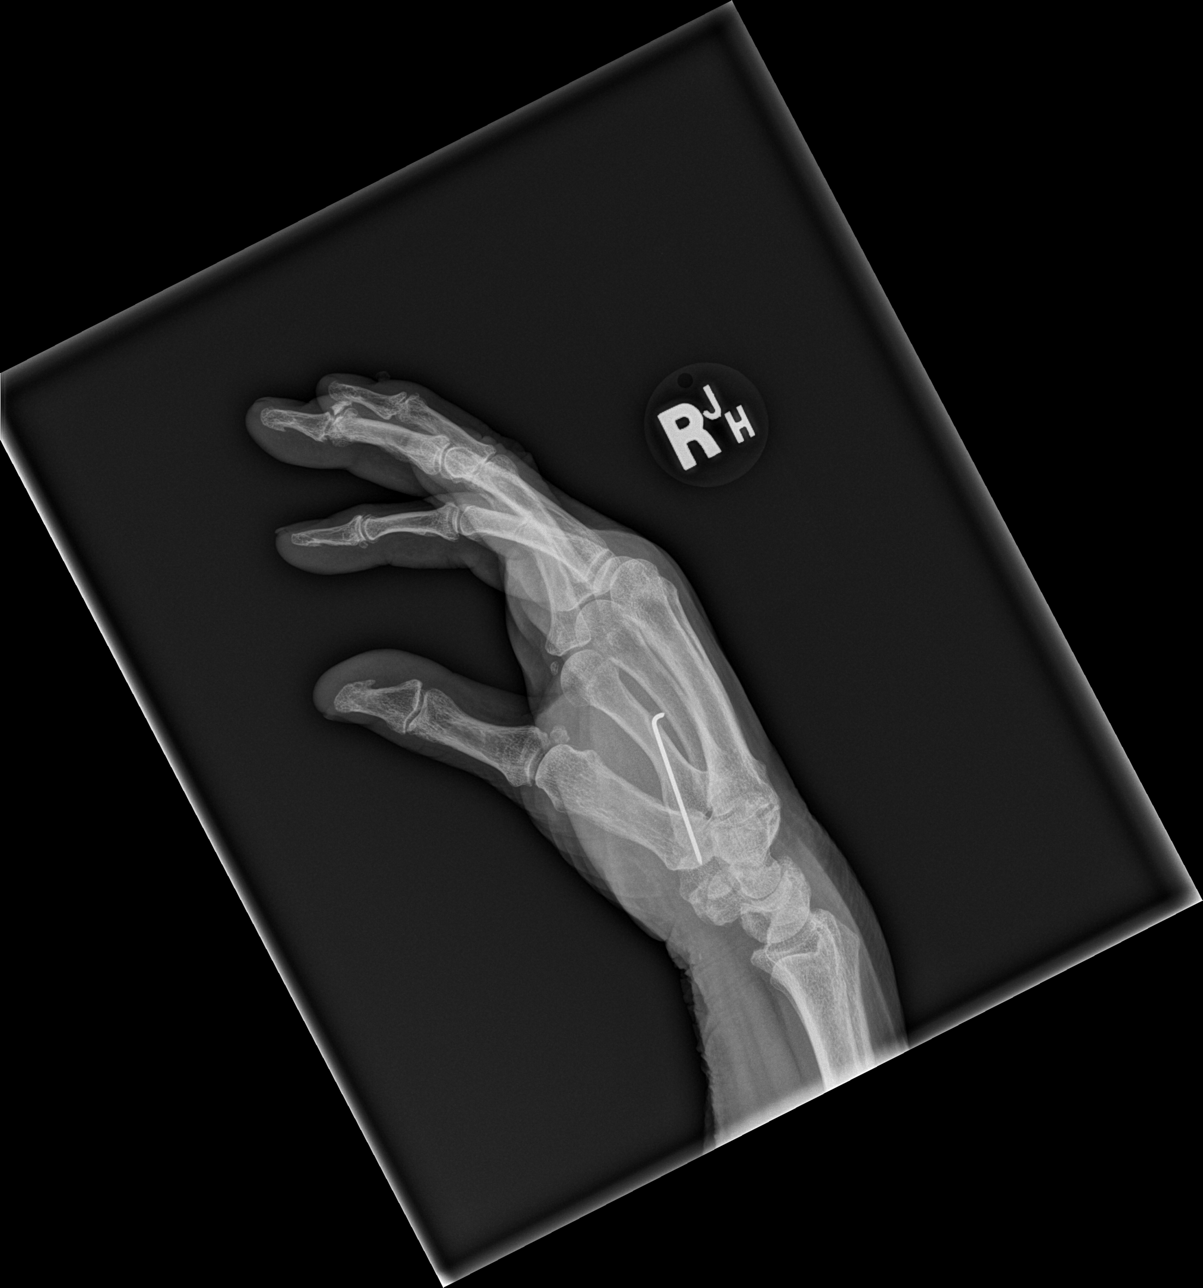

[3 of 3 positions shown; findings below may reference images not displayed]

FINDINGS: Frontal, oblique, and lateral views obtained. There is pin fixation
through a comminuted fracture of the proximal fifth metacarpal with
fracture fragments in essentially anatomic alignment in this region.
There is no demonstrable acute fracture or dislocation. There is
osteoarthritic change in the third DIP joint with calcification in
the dorsal aspect of the third DIP joint, probably residua of old
trauma. There is milder osteoarthritic change in all other PIP and
DIP joints. No bony destruction.
IMPRESSION: Old trauma proximal fifth metacarpal with pin fixation. Alignment is
near anatomic in this area. Areas of osteoarthritic change, most
severe in the third DIP joint. No acute fracture or dislocation
evident.

## 2016-05-20 ENCOUNTER — Emergency Department (HOSPITAL_COMMUNITY)
Admission: EM | Admit: 2016-05-20 | Discharge: 2016-05-20 | Disposition: A | Discharge status: Home / Self Care | Payer: Medicare Other | Attending: Emergency Medicine | Admitting: Emergency Medicine

## 2016-05-20 ENCOUNTER — Emergency Department (HOSPITAL_COMMUNITY): Payer: Medicare Other

## 2016-05-20 ENCOUNTER — Encounter (HOSPITAL_COMMUNITY): Payer: Self-pay | Admitting: Emergency Medicine

## 2016-05-20 DIAGNOSIS — E1122 Type 2 diabetes mellitus with diabetic chronic kidney disease: Secondary | ICD-10-CM | POA: Insufficient documentation

## 2016-05-20 DIAGNOSIS — Z87891 Personal history of nicotine dependence: Secondary | ICD-10-CM | POA: Diagnosis not present

## 2016-05-20 DIAGNOSIS — Y999 Unspecified external cause status: Secondary | ICD-10-CM | POA: Diagnosis not present

## 2016-05-20 DIAGNOSIS — I129 Hypertensive chronic kidney disease with stage 1 through stage 4 chronic kidney disease, or unspecified chronic kidney disease: Secondary | ICD-10-CM | POA: Insufficient documentation

## 2016-05-20 DIAGNOSIS — W228XXA Striking against or struck by other objects, initial encounter: Secondary | ICD-10-CM | POA: Diagnosis not present

## 2016-05-20 DIAGNOSIS — Z7901 Long term (current) use of anticoagulants: Secondary | ICD-10-CM | POA: Insufficient documentation

## 2016-05-20 DIAGNOSIS — N189 Chronic kidney disease, unspecified: Secondary | ICD-10-CM | POA: Diagnosis not present

## 2016-05-20 DIAGNOSIS — S199XXA Unspecified injury of neck, initial encounter: Secondary | ICD-10-CM | POA: Diagnosis not present

## 2016-05-20 DIAGNOSIS — Z79899 Other long term (current) drug therapy: Secondary | ICD-10-CM | POA: Diagnosis not present

## 2016-05-20 DIAGNOSIS — S0101XA Laceration without foreign body of scalp, initial encounter: Secondary | ICD-10-CM | POA: Insufficient documentation

## 2016-05-20 DIAGNOSIS — J45909 Unspecified asthma, uncomplicated: Secondary | ICD-10-CM | POA: Insufficient documentation

## 2016-05-20 DIAGNOSIS — Z23 Encounter for immunization: Secondary | ICD-10-CM | POA: Insufficient documentation

## 2016-05-20 DIAGNOSIS — W19XXXA Unspecified fall, initial encounter: Secondary | ICD-10-CM

## 2016-05-20 DIAGNOSIS — Y939 Activity, unspecified: Secondary | ICD-10-CM | POA: Diagnosis not present

## 2016-05-20 DIAGNOSIS — S0003XA Contusion of scalp, initial encounter: Secondary | ICD-10-CM | POA: Diagnosis not present

## 2016-05-20 DIAGNOSIS — Y92129 Unspecified place in nursing home as the place of occurrence of the external cause: Secondary | ICD-10-CM | POA: Diagnosis not present

## 2016-05-20 DIAGNOSIS — S0990XA Unspecified injury of head, initial encounter: Secondary | ICD-10-CM | POA: Diagnosis present

## 2016-05-20 DIAGNOSIS — G20 Parkinson's disease: Secondary | ICD-10-CM | POA: Diagnosis not present

## 2016-05-20 MED ORDER — ACETAMINOPHEN 325 MG PO TABS
ORAL_TABLET | ORAL | Status: AC
  Filled 2016-05-20: qty 2

## 2016-05-20 MED ORDER — ACETAMINOPHEN 325 MG PO TABS
650.0000 mg | ORAL_TABLET | Freq: Once | ORAL | Status: AC
Start: 2016-05-20 — End: 2016-05-20
  Administered 2016-05-20: 650 mg via ORAL

## 2016-05-20 MED ORDER — TETANUS-DIPHTH-ACELL PERTUSSIS 5-2.5-18.5 LF-MCG/0.5 IM SUSP
0.5000 mL | Freq: Once | INTRAMUSCULAR | Status: AC
  Administered 2016-05-20: 0.5 mL via INTRAMUSCULAR
  Filled 2016-05-20: qty 0.5

## 2016-05-20 NOTE — Discharge Instructions (Signed)

## 2016-05-20 NOTE — ED Triage Notes (Addendum)
[facility] is [facility] and slipped off her bed and hit her head,  Has small lac to back of her head, bleeding controlled at this time,  Fall was witnessed by staff and no loc pt aa and normal per statff that is with her
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
## 2016-05-20 NOTE — ED Notes (Signed)
Pt and caregiver verbalizes understanding of discharge instructions and follow up information; signature pad malfunction therefore signature unobtainable

## 2016-05-21 ENCOUNTER — Encounter: Payer: Self-pay | Admitting: Medical

## 2016-05-21 ENCOUNTER — Ambulatory Visit (INDEPENDENT_AMBULATORY_CARE_PROVIDER_SITE_OTHER): Payer: Medicare Other | Admitting: Medical

## 2016-05-21 VITALS — BP 110/64 | HR 66 | Temp 97.6°F | Wt 163.0 lb

## 2016-05-21 DIAGNOSIS — W1809XD Striking against other object with subsequent fall, subsequent encounter: Secondary | ICD-10-CM | POA: Diagnosis not present

## 2016-05-21 DIAGNOSIS — R05 Cough: Secondary | ICD-10-CM | POA: Diagnosis not present

## 2016-05-21 DIAGNOSIS — R296 Repeated falls: Secondary | ICD-10-CM | POA: Diagnosis not present

## 2016-05-21 DIAGNOSIS — S0101XD Laceration without foreign body of scalp, subsequent encounter: Secondary | ICD-10-CM

## 2016-05-21 DIAGNOSIS — J479 Bronchiectasis, uncomplicated: Secondary | ICD-10-CM | POA: Diagnosis not present

## 2016-05-21 DIAGNOSIS — T148XXA Other injury of unspecified body region, initial encounter: Secondary | ICD-10-CM

## 2016-05-21 DIAGNOSIS — R238 Other skin changes: Secondary | ICD-10-CM

## 2016-05-21 DIAGNOSIS — R059 Cough, unspecified: Secondary | ICD-10-CM

## 2016-05-21 MED ORDER — SILVER SULFADIAZINE 1 % EX CREA: 1.0000 "application " | TOPICAL_CREAM | Freq: Every day | CUTANEOUS | 0 refills | Status: DC

## 2016-05-21 NOTE — Progress Notes (Signed)
Subjective: Chief Complaint  Patient presents with  . Cough    about a week in a half ago. fell this weekend, and yesterday. slipped off of the corner of the bed and slid off yesterday not sure about the fall this weekend.     Here for several concerns, accompanied by caregiver.   Here for recheck on wound on back of scalp.  Fell yesterday while trying to sit on edge of bed.  Landed on back and back of head hit ground.   Went to the ED, had CT scan, had staples applied to laceration of back of scalp.  Today she denies headache, denies blurred vision, no numbness or tingling.  She notes no major pain in back of head.  Not using ice.   Cough - has cough worse at night, in the day is a clearing the throat type of cough.  At night cough is more frequent.  Doesn't feel sick, no fever, no productive cough.  No sore throat, no SOB.   Not using Albuterol as apparently there was discrepancy in instructions from last visit so not using albuterol at all.  Still using Pulmicort BID.  This past weekend fell a different time walking.  Struck an object with left hand tearing skin. Wants [place] to look at wound.  Denies pain in wrist, hand, or grasping.  She just notes tenderness at the wound.   Lost balance.  Denies vision or hearing changes, no chest pain, no edema, no syncope, no dizziness.  No other aggravating or relieving factors. No other complaint.   Past Medical History:  Diagnosis Date  . Allergy   . Anemia of chronic disease 10/2010   stable as of 2015, on iron therapy for mild iron deficiency, chronic kidney disease, anemia of chronic disease; Dr. Arline Asp prior consult  . Chronic kidney disease   . Constipation    mild intermittent  . Depression    [facility] - NP Lolita Cram  . Diabetes mellitus   . Diabetes type 2, controlled (HCC)   . Edentulous   . Falls   . GERD (gastroesophageal reflux disease)   . H/O cardiovascular stress test 06/2014   nuclear stress test normal; Dr.  Huston Foley  . H/O echocardiogram 06/17/14   mild LVH, EF 60-65%, no wall motion abnormalities  . H/O mammogram 08/04/2014   normal  . History of MRI of brain and brain stem 11/2010   normal MRI of brain  . Hx of deep venous thrombosis    lifelong anticoagulant therapy  . Hyperlipidemia   . Hypertension   . Hypertrophic obstructive cardiomyopathy (HOCM) (HCC) 2015   normal echo and stress test other than mild LVH 06/2014; Dr. Dietrich Pates  . Long term current use of anticoagulant therapy    due to hx/o recurrent DVT  . Mild mental retardation   . Moderate persistent asthma    asthma and bronchiectasis, pulm consult 2015; unable to do PFTs, 2015  . Mood disorder (HCC)    [facility] - NP Lolita Cram  . Osteoarthritis of both knees    Dr. Gean Birchwood  . Osteopenia 2013   improvements on Boniva and Ca+D from 2011-2013.  2013 Bone Density normal /improved; repeat Bone Density scan 2016  . PVD (peripheral vascular disease) (HCC)   . Shortness of breath    06/2014 cardiac consult, SOB seems to be related to poor technique with handheld inhaler, switched to nebs with much improvement  .  Thrombocytopenia (HCC) 10/2010   due to medications and immune dysregulation likely, stable as of 2015; no further investigation; hematology, Dr. Arline Asp  . Tremor 2015   consult with Dr. Mindi Slicker Neurology.  Parkinsonian tremor without Parkinsons     Objective:  BP 110/64   Pulse 66   Temp 97.6 F (36.4 C) (Tympanic)   Wt 163 lb (73.9 kg) Comment: was shaking and not fully letting go of walker  BMI 24.78 kg/m   Gen: wd, wn, nad Left forearm posteriorly distal forearm with superficial wound 5cm x 1 cm, surrounding purplish echymosis Posterior scalp with vertical linear wound with 5 staples present, underling hematoma, somewhat tender Lungs clear Oral somewhat dry mucosa HENT otherwise unremarkable strength and pulses strength and sensation of extremities seems WNL Resting  tremor present as usual, moderate tremor, seems to be her usual affect, answers questions appropriable Several other bruises noted of bilat forearms and left elbow nontender otherwise over neck back arms and legs other than left forearm wound    Assessment: Encounter Diagnoses  Name Primary?  Marland Kitchen Wound of skin Yes  . Scalp laceration, subsequent encounter   . Fall against object, subsequent encounter   . Cough   . Frequent falls   . Bronchiectasis without complication (HCC)     Plan: Scalp laceration - examined wound. F/u in 4 days for staple removal.  advised ice BID x 20 min each Fall - she will try plain walker to see if this gives any more stability to help prevent falls Cough - increase albuterol use short term, then prn, c/t Pulmicort BID Begin silvadene cream daily with daily dressing change to left forearm wound/skin tear. F/u in 4 days.   Spent > 30 minutes face to face with patient in discussion of symptoms, evaluation, plan and recommendations.

## 2016-05-21 NOTE — Patient Instructions (Addendum)
Back of scalp laceration  Use ice pack with cloth in between skin and ice pack, 15-20 minutes two to three times daily as tolerated for the next 3 days  Return in 4 days for staple removal  If red, hot, tender to touch, call back sooner  Currently the wound is healing ok  Cough   Continue Pulmicort nebulized treatment twice daily as usual  Use albuterol nebulized treatment every 4-6 hours as need for shortness of breath or cough  So currently since she is having increased cough, go ahead and use the albuterol at least twice daily through Friday  Then, use Albuterol as needed every 4-6 hours.  This is a standing prn script.  If not improving by Friday in regards to cough, then call back  Skin tear  Begin daily dressing change cleaning wound gently with soap and water, cover with silvadene cream and new bandage daily.  Use paper tape to avoid further skin tearing.  Begin trial of regular walker for a week to see if she tolerates this better getting in and out of chart and with ambulation.  Keep the other walker in case we go back to this.  Let me know within a week how she is doing with this.   I do recommend trying to encourage her to drink more water in general

## 2016-05-21 NOTE — Addendum Note (Signed)
Addended by: Jac Canavan on: 05/21/2016 11:37 AM   Modules accepted: Orders

## 2016-05-24 ENCOUNTER — Encounter: Payer: Self-pay | Admitting: Medical

## 2016-05-24 ENCOUNTER — Ambulatory Visit: Payer: Medicare Other | Admitting: Medical

## 2016-05-24 ENCOUNTER — Ambulatory Visit (INDEPENDENT_AMBULATORY_CARE_PROVIDER_SITE_OTHER): Payer: Medicare Other | Admitting: Medical

## 2016-05-24 VITALS — BP 128/80

## 2016-05-24 DIAGNOSIS — Z4802 Encounter for removal of sutures: Secondary | ICD-10-CM

## 2016-05-24 DIAGNOSIS — S0101XD Laceration without foreign body of scalp, subsequent encounter: Secondary | ICD-10-CM

## 2016-05-24 NOTE — Progress Notes (Signed)
Subjective:     Patient ID: [name], female   DOB: 1935-09-30, 81 y.o.   MRN: [id number]  HPI Chief Complaint  Patient presents with  . Suture / Staple Removal    refused to be weighed.     Here for staple removal.  I saw her earlier in the week for wound check and other issues.   She is feeling fine today, ready to get staples out.  No wound drainage, less tenderness of posterior scalp wound now. No new c/o  Review of Systems     Objective:   Physical Exam  Gen: we, wn, ,nad Posterior scalp central with vertical linear wound with 4 staples present.  Crusting blood, less swelling than 3 days ago.   No pus, no warmth, induration, or fluctuance.      Assessment:     Encounter Diagnoses  Name Primary?  . Encounter for staple removal Yes  . Scalp laceration, subsequent encounter        Plan:     Examined wound, healing approprietly, cleaned and prepped wound in usual fashion. Removed 4 staples with staple remover.  Patient tolerate well.  F/u prn.

## 2016-06-07 ENCOUNTER — Telehealth: Payer: Self-pay | Admitting: Medical

## 2016-06-07 ENCOUNTER — Other Ambulatory Visit: Payer: Self-pay | Admitting: Medical

## 2016-06-07 DIAGNOSIS — G894 Chronic pain syndrome: Secondary | ICD-10-CM

## 2016-06-07 MED ORDER — HYDROCODONE-ACETAMINOPHEN 5-325 MG PO TABS: 1.0000 | ORAL_TABLET | Freq: Two times a day (BID) | ORAL | 0 refills | Status: DC

## 2016-06-07 NOTE — Telephone Encounter (Signed)
LM that script ready for pick up.

## 2016-06-07 NOTE — Telephone Encounter (Signed)
rx ready for pickup 

## 2016-06-07 NOTE — Telephone Encounter (Signed)
Caregiver called and Pt needs refill hydrocodone to [facility]

## 2016-06-12 ENCOUNTER — Telehealth: Payer: Self-pay

## 2016-06-12 NOTE — Telephone Encounter (Signed)
She was going to orthopedist for this.  I would have her f/u there.   See if chart record shows which ortho office she was seeing.

## 2016-06-12 NOTE — Telephone Encounter (Signed)
[name] at [facility] for pt called to see if it was time for her to get her knee injections. Reviewed chart back to Feb 2017 and was not able to find records of this. Please advise if you or Dr. Susann Givens would do knee injections. [name] # [phone]. Trixie Rude

## 2016-06-13 NOTE — Telephone Encounter (Signed)
Called and left message on [facility] voice mail word for word and I looked back in her record and did not see who the ortho was

## 2016-06-25 ENCOUNTER — Telehealth: Payer: Self-pay | Admitting: Medical

## 2016-06-25 DIAGNOSIS — M170 Bilateral primary osteoarthritis of knee: Secondary | ICD-10-CM | POA: Diagnosis not present

## 2016-06-25 NOTE — Telephone Encounter (Signed)
[employer] asked for clarification on how/when to check blood sugar. Order that have on file is not clear if they are to check 2 times a week & PRN OR one or the other. Order also state to report to provider if reading is less that 70 or greater that 200.

## 2016-06-27 NOTE — Telephone Encounter (Signed)
Fax/call in order, see if this is ok for them  I mainly want 2 times per week monitoring to ensure glucose is staying under 130 and not running high  But they also should check as needed if symptoms/signs of hypoglycemia.   If this still doesn't make it clear to them, I can write a much more detailed order

## 2016-06-27 NOTE — Telephone Encounter (Signed)
LMTCB and written order upfront

## 2016-06-28 NOTE — Telephone Encounter (Signed)
Spoke to [place] and will fax over order to her for pt/  Fax # [phone]

## 2016-07-16 ENCOUNTER — Telehealth: Payer: Self-pay | Admitting: Medical

## 2016-07-16 NOTE — Telephone Encounter (Addendum)
It is time for pt's next Prolia injection, recv'd approval from [employer] [employer].  Pt's caregiver has to get from her pharmacy & bring here for injection.  Spoke with Eden Emms t# [phone] & she will pick up from pharmacy & bring her here for injection. Please send Rx to [facility] pharmacy

## 2016-07-17 ENCOUNTER — Other Ambulatory Visit: Payer: Self-pay | Admitting: Medical

## 2016-07-17 MED ORDER — DENOSUMAB 60 MG/ML ~~LOC~~ SOLN: 60.0000 mg | Freq: Once | SUBCUTANEOUS | 0 refills | Status: AC

## 2016-07-23 DIAGNOSIS — F329 Major depressive disorder, single episode, unspecified: Secondary | ICD-10-CM | POA: Diagnosis not present

## 2016-07-23 DIAGNOSIS — F71 Moderate intellectual disabilities: Secondary | ICD-10-CM | POA: Diagnosis not present

## 2016-07-23 DIAGNOSIS — F4321 Adjustment disorder with depressed mood: Secondary | ICD-10-CM | POA: Diagnosis not present

## 2016-08-01 ENCOUNTER — Encounter: Payer: Self-pay | Admitting: Medical

## 2016-08-01 ENCOUNTER — Ambulatory Visit (INDEPENDENT_AMBULATORY_CARE_PROVIDER_SITE_OTHER): Payer: Medicare Other | Admitting: Medical

## 2016-08-01 VITALS — BP 102/70 | HR 66 | Ht 68.0 in | Wt 156.0 lb

## 2016-08-01 DIAGNOSIS — N189 Chronic kidney disease, unspecified: Secondary | ICD-10-CM

## 2016-08-01 DIAGNOSIS — Z79899 Other long term (current) drug therapy: Secondary | ICD-10-CM | POA: Diagnosis not present

## 2016-08-01 DIAGNOSIS — R413 Other amnesia: Secondary | ICD-10-CM | POA: Insufficient documentation

## 2016-08-01 DIAGNOSIS — R296 Repeated falls: Secondary | ICD-10-CM | POA: Diagnosis not present

## 2016-08-01 DIAGNOSIS — E118 Type 2 diabetes mellitus with unspecified complications: Secondary | ICD-10-CM

## 2016-08-01 DIAGNOSIS — I10 Essential (primary) hypertension: Secondary | ICD-10-CM

## 2016-08-01 DIAGNOSIS — Z23 Encounter for immunization: Secondary | ICD-10-CM | POA: Diagnosis not present

## 2016-08-01 DIAGNOSIS — F70 Mild intellectual disabilities: Secondary | ICD-10-CM | POA: Diagnosis not present

## 2016-08-01 DIAGNOSIS — F0630 Mood disorder due to known physiological condition, unspecified: Secondary | ICD-10-CM

## 2016-08-01 DIAGNOSIS — R8290 Unspecified abnormal findings in urine: Secondary | ICD-10-CM

## 2016-08-01 DIAGNOSIS — Z7409 Other reduced mobility: Secondary | ICD-10-CM | POA: Diagnosis not present

## 2016-08-01 DIAGNOSIS — Z7901 Long term (current) use of anticoagulants: Secondary | ICD-10-CM | POA: Diagnosis not present

## 2016-08-01 DIAGNOSIS — E559 Vitamin D deficiency, unspecified: Secondary | ICD-10-CM | POA: Diagnosis not present

## 2016-08-01 LAB — CBC
HEMATOCRIT: 32.9 % — AB (ref 35.0–45.0)
HEMOGLOBIN: 11.2 g/dL — AB (ref 11.7–15.5)
MCH: 30.9 pg (ref 27.0–33.0)
MCHC: 34 g/dL (ref 32.0–36.0)
MCV: 90.9 fL (ref 80.0–100.0)
MPV: 11.7 fL (ref 7.5–12.5)
Platelets: 141 10*3/uL (ref 140–400)
RBC: 3.62 MIL/uL — ABNORMAL LOW (ref 3.80–5.10)
RDW: 12.9 % (ref 11.0–15.0)
WBC: 5.6 10*3/uL (ref 4.0–10.5)

## 2016-08-01 LAB — POCT URINALYSIS DIPSTICK
BILIRUBIN UA: NEGATIVE
GLUCOSE UA: NEGATIVE
Ketones, UA: NEGATIVE
Nitrite, UA: POSITIVE
PH UA: 6
Protein, UA: NEGATIVE
RBC UA: NEGATIVE
SPEC GRAV UA: 1.025
UROBILINOGEN UA: NEGATIVE

## 2016-08-01 LAB — COMPREHENSIVE METABOLIC PANEL
ALBUMIN: 3.6 g/dL (ref 3.6–5.1)
ALK PHOS: 59 U/L (ref 33–130)
ALT: 7 U/L (ref 6–29)
AST: 14 U/L (ref 10–35)
BILIRUBIN TOTAL: 0.4 mg/dL (ref 0.2–1.2)
BUN: 37 mg/dL — ABNORMAL HIGH (ref 7–25)
CO2: 29 mmol/L (ref 20–31)
CREATININE: 2.15 mg/dL — AB (ref 0.60–0.93)
Calcium: 9.3 mg/dL (ref 8.6–10.4)
Chloride: 103 mmol/L (ref 98–110)
Glucose, Bld: 155 mg/dL — ABNORMAL HIGH (ref 65–99)
Potassium: 4.8 mmol/L (ref 3.5–5.3)
SODIUM: 141 mmol/L (ref 135–146)
TOTAL PROTEIN: 6 g/dL — AB (ref 6.1–8.1)

## 2016-08-01 LAB — TSH: TSH: 1.44 mIU/L

## 2016-08-01 LAB — VITAMIN B12: Vitamin B-12: 483 pg/mL (ref 200–1100)

## 2016-08-01 NOTE — Progress Notes (Signed)
Attempted to complete a vision exam, mini-mental state examination and patient health questionnaire (PHQ-9), patient was unable to complete these exams due to mild mental retardation.

## 2016-08-01 NOTE — Progress Notes (Signed)
Subjective: Chief Complaint  Patient presents with  . Memory Loss Concerns    psychology requested memory evaluation due to forgetting    Here with caregiver from group home.   [facility]    Recently saw psychiatry, Lolita CramLinda Grininger, NP.  They wanted her to come in here today for memory loss concerns, medical eval.   In recent weeks she forgets that she has eaten, she forgets to tell caregiver if she has fallen.  Seems to have recent change in memory.   She goes to day program but not necessarily participating.  They have art and reading room, music room.  Watches TV, likes [employer] show.  Some days she eats 100% of meal, sometimes only 40-50% of meals.  No urinary symptoms.  More emotional at times, crying spells.    [name] is the only visitor but hasn't been in a month.   Sees cardiology Monday.  Past Medical History:  Diagnosis Date  . Allergy   . Anemia of chronic disease 10/2010   stable as of 2015, on iron therapy for mild iron deficiency, chronic kidney disease, anemia of chronic disease; Dr. Arline AspMurinson prior consult  . Chronic kidney disease   . Constipation    mild intermittent  . Depression    [facility] - NP Lolita CramLinda Grininger  . Diabetes mellitus   . Diabetes type 2, controlled (HCC)   . Edentulous   . Falls   . GERD (gastroesophageal reflux disease)   . H/O cardiovascular stress test 06/2014   nuclear stress test normal; Dr. Huston FoleyPaul Ross  . H/O echocardiogram 06/17/14   mild LVH, EF 60-65%, no wall motion abnormalities  . H/O mammogram 08/04/2014   normal  . History of MRI of brain and brain stem 11/2010   normal MRI of brain  . Hx of deep venous thrombosis    lifelong anticoagulant therapy  . Hyperlipidemia   . Hypertension   . Hypertrophic obstructive cardiomyopathy (HOCM) (HCC) 2015   normal echo and stress test other than mild LVH 06/2014; Dr. Dietrich PatesPaula Ross  . Long term current use of anticoagulant therapy    due to hx/o recurrent DVT  .  Mild mental retardation   . Moderate persistent asthma    asthma and bronchiectasis, pulm consult 2015; unable to do PFTs, 2015  . Mood disorder (HCC)    [facility] - NP Lolita CramLinda Grininger  . Osteoarthritis of both knees    Dr. Gean BirchwoodFrank Rowan  . Osteopenia 2013   improvements on Boniva and Ca+D from 2011-2013.  2013 Bone Density normal /improved; repeat Bone Density scan 2016  . PVD (peripheral vascular disease) (HCC)   . Shortness of breath    06/2014 cardiac consult, SOB seems to be related to poor technique with handheld inhaler, switched to nebs with much improvement  . Thrombocytopenia (HCC) 10/2010   due to medications and immune dysregulation likely, stable as of 2015; no further investigation; hematology, Dr. Arline AspMurinson  . Tremor 2015   consult with Dr. Mindi SlickerPenumali/[facility].  Parkinsonian tremor without Parkinsons   Current Outpatient Prescriptions on File Prior to Visit  Medication Sig Dispense Refill  . ACCU-CHEK AVIVA PLUS test strip CHECK BLOOD GLUCOSE (SUGAR) 1 OR 2 TIMESA WEEK 100 each 2  . albuterol (PROVENTIL) (2.5 MG/3ML) 0.083% nebulizer solution Take 3 mLs (2.5 mg total) by nebulization every 6 (six) hours as needed for wheezing or shortness of breath (dx J47.9    I42.2). 360 mL  5  . budesonide (PULMICORT) 0.5 MG/2ML nebulizer solution TAKE 2 ml (0.5mg ) BY NEBULIZER TWICE DAILY 120 mL 11  . buPROPion (WELLBUTRIN SR) 150 MG 12 hr tablet Take 150 mg by mouth daily after breakfast.      . Calcium Carbonate-Vit D-Min (CALCIUM 1200) 1200-1000 MG-UNIT CHEW Chew 1,000 mg by mouth once. 90 each 3  . divalproex (DEPAKOTE) 250 MG DR tablet Take 500 mg by mouth 2 (two) times daily.     Marland Kitchen docusate sodium (COLACE) 100 MG capsule Take 1 capsule (100 mg total) by mouth 2 (two) times daily. Prn 60 capsule 11  . ferrous sulfate 325 (65 FE) MG tablet Take 1 tablet (325 mg total) by mouth daily with breakfast. 90 tablet 3  . FLUoxetine (PROZAC) 10 MG capsule Take 10 mg by mouth  daily. 10mg  plus 40 mg am    . FLUoxetine (PROZAC) 40 MG capsule Take 40 mg by mouth daily.      . fluticasone (FLONASE) 50 MCG/ACT nasal spray Place 2 sprays into both nostrils daily. 16 g 11  . furosemide (LASIX) 40 MG tablet Take 0.5 tablets (20 mg total) by mouth daily. 90 tablet 3  . HYDROcodone-acetaminophen (NORCO) 5-325 MG tablet Take 1 tablet by mouth 2 (two) times daily. 180 tablet 0  . isosorbide mononitrate (IMDUR) 30 MG 24 hr tablet Take 1 tablet (30 mg total) by mouth daily. 90 tablet 3  . loratadine (QC LORATADINE ALLERGY RELIEF) 10 MG tablet Take 1 tablet (10 mg total) by mouth daily. 90 tablet 3  . LORazepam (ATIVAN) 0.5 MG tablet Take 0.5 mg by mouth 2 (two) times daily. And as needed    . metoCLOPramide (REGLAN) 5 MG tablet Take 1 tablet (5 mg total) by mouth daily. 90 tablet 3  . metoprolol tartrate (LOPRESSOR) 25 MG tablet Take 1 tablet (25 mg total) by mouth 2 (two) times daily. 180 tablet 3  . montelukast (SINGULAIR) 10 MG tablet Take 1 tablet (10 mg total) by mouth at bedtime. 90 tablet 3  . omeprazole (PRILOSEC) 20 MG capsule Take 1 capsule (20 mg total) by mouth daily. 90 capsule 3  . potassium chloride (K-DUR) 10 MEQ tablet Take 1 tablet (10 mEq total) by mouth daily. 90 tablet 3  . PRODIGY TWIST TOP LANCETS 28G MISC TWICE weekly 100 each 2  . rivaroxaban (XARELTO) 20 MG TABS tablet Take 1 tablet (20 mg total) by mouth daily with supper. 90 tablet 3  . silver sulfADIAZINE (SILVADENE) 1 % cream Apply 1 application topically daily. 50 g 0  . simvastatin (ZOCOR) 20 MG tablet Take 1 tablet (20 mg total) by mouth daily at 6 PM. 90 tablet 3   No current facility-administered medications on file prior to visit.    ROS subjective    Objective: BP 102/70   Pulse 66   Ht 5\' 8"  (1.727 m)   Wt 156 lb (70.8 kg)   SpO2 97%   BMI 23.72 kg/m   General appearance: alert, no distress, WD/WN, white female, somewhat slumped in chair Bruise on left dorsal hand over 1st -3 rd  metacarpals HEENT: normocephalic, sclerae anicteric, PERRLA, EOMi, nares patent, no discharge or erythema, pharynx normal Oral cavity: MMM, no lesions Neck: supple, no lymphadenopathy, no thyromegaly, no masses, no bruits Heart: RRR, normal S1, S2, no murmurs Lungs: CTA bilaterally, no wheezes, rhonchi, or rales Abdomen: +bs, soft, non tender, non distended, no masses, no hepatomegaly, no splenomegaly Back: non tender Musculoskeletal: nontender, no swelling, no  obvious deformity Extremities: no edema, no cyanosis, no clubbing Pulses: 1+ symmetric, upper and lower extremities, normal cap refill Neurological: alert, action tremor noted, strength normal upper extremities and lower extremities, sensation normal throughout, DTRs 2+ throughout, no cerebellar signs, gait normal Psychiatric: normal affect, behavior normal, pleasant, answers questions appropriate but not as happy and smiling and interactive as at prior visits.    Assessment:. Encounter Diagnoses  Name Primary?  . Memory change Yes  . Impaired mobility   . Need for prophylactic vaccination and inoculation against influenza   . Abnormal urinalysis   . Frequent falls   . Mild mental retardation   . Mood disorder in conditions classified elsewhere   . High risk medication use   . Chronic kidney disease, unspecified CKD stage   . Type 2 diabetes mellitus with complication, without long-term current use of insulin (HCC)   . Essential hypertension      Plan: In recent visits since she left the smaller group home she seems to have declined in mood, activity, overall.  I suspect this is in part due to age related declined, likely some dementia and depression.  She may have UTI as well today.  Lab eval today, likely referral to neurology.   Reviewed PHQ9, MMSE.     Counseled on strategies to help with memory such as having her refer to calendar and clock daily when inquired by caregiver, color by numbers, drawing, bingo, board  games.  Urine culture sent  Alleya was seen today for memory loss concerns.  Diagnoses and all orders for this visit:  Memory change  Impaired mobility  Need for prophylactic vaccination and inoculation against influenza  Abnormal urinalysis  Frequent falls  Mild mental retardation  Mood disorder in conditions classified elsewhere  High risk medication use  Chronic kidney disease, unspecified CKD stage  Type 2 diabetes mellitus with complication, without long-term current use of insulin (HCC)  Essential hypertension  Other orders -     Flu vaccine HIGH DOSE PF -     POCT urinalysis dipstick -     TSH -     CBC -     Vitamin B12 -     VITAMIN D 25 Hydroxy (Vit-D Deficiency, Fractures) -     Hemoglobin A1c -     Comprehensive metabolic panel -     Protime-INR -     APTT -     Urine culture

## 2016-08-01 NOTE — Telephone Encounter (Signed)
Called & left message for Elon Jester regarding Prolia injection

## 2016-08-02 ENCOUNTER — Other Ambulatory Visit: Payer: Medicare Other

## 2016-08-02 LAB — PROTIME-INR
INR: 1
PROTHROMBIN TIME: 11.1 s (ref 9.0–11.5)

## 2016-08-02 LAB — APTT: aPTT: 25 s (ref 22–34)

## 2016-08-02 LAB — HEMOGLOBIN A1C
Hgb A1c MFr Bld: 5.8 % — ABNORMAL HIGH (ref <5.7)
Mean Plasma Glucose: 120 mg/dL

## 2016-08-02 LAB — VITAMIN D 25 HYDROXY (VIT D DEFICIENCY, FRACTURES): Vit D, 25-Hydroxy: 85 ng/mL (ref 30–100)

## 2016-08-04 ENCOUNTER — Other Ambulatory Visit: Payer: Self-pay | Admitting: Medical

## 2016-08-04 LAB — URINE CULTURE

## 2016-08-04 MED ORDER — AMOXICILLIN-POT CLAVULANATE 875-125 MG PO TABS: 1.0000 | ORAL_TABLET | Freq: Two times a day (BID) | ORAL | 0 refills | Status: DC

## 2016-08-05 ENCOUNTER — Encounter: Payer: Self-pay | Admitting: Physician Assistant

## 2016-08-05 ENCOUNTER — Ambulatory Visit (INDEPENDENT_AMBULATORY_CARE_PROVIDER_SITE_OTHER): Payer: Medicare Other | Admitting: Physician Assistant

## 2016-08-05 VITALS — BP 140/70 | HR 60 | Ht 68.0 in | Wt 169.1 lb

## 2016-08-05 DIAGNOSIS — J45909 Unspecified asthma, uncomplicated: Secondary | ICD-10-CM

## 2016-08-05 DIAGNOSIS — I10 Essential (primary) hypertension: Secondary | ICD-10-CM

## 2016-08-05 DIAGNOSIS — R5383 Other fatigue: Secondary | ICD-10-CM

## 2016-08-05 DIAGNOSIS — R251 Tremor, unspecified: Secondary | ICD-10-CM

## 2016-08-05 DIAGNOSIS — Z86718 Personal history of other venous thrombosis and embolism: Secondary | ICD-10-CM

## 2016-08-05 MED ORDER — METOPROLOL TARTRATE 25 MG PO TABS: 12.5000 mg | ORAL_TABLET | Freq: Two times a day (BID) | ORAL | 3 refills | Status: DC
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
## 2016-08-05 NOTE — Patient Instructions (Addendum)
Medication Instructions:  START TAKING METOPROLOL 12.5 MG TWICE A DAY   If you need a refill on your cardiac medications before your next appointment, please call your pharmacy.  Labwork: NONE ORDER TODAY  Testing/Procedures: Your physician has requested that you have an echocardiogram. Echocardiography is a painless test that uses sound waves to create images of your heart. It provides your doctor with information about the size and shape of your heart and how well your heart's chambers and valves are working. This procedure takes approximately one hour. There are no restrictions for this procedure.  Follow-Up:  Your physician wants you to follow-up in:  IN 6   MONTHS WITH DR Tenny Craw You will receive a reminder letter in the mail two months in advance. If you don't receive a letter, please call our office to schedule the follow-up appointment.  Any Other Special Instructions Will Be Listed Below (If Applicable).

## 2016-08-06 ENCOUNTER — Telehealth: Payer: Self-pay | Admitting: Family Medicine

## 2016-08-06 NOTE — Telephone Encounter (Signed)
Pt lives at [facility] and Rudy Jew RN there that cares for patient requested recent labs results as patient has fallen often recently.  Same sent via fax [phone]

## 2016-08-16 ENCOUNTER — Telehealth: Payer: Self-pay | Admitting: Medical

## 2016-08-16 ENCOUNTER — Encounter: Payer: Self-pay | Admitting: Medical

## 2016-08-16 ENCOUNTER — Other Ambulatory Visit: Payer: Self-pay

## 2016-08-16 ENCOUNTER — Ambulatory Visit (INDEPENDENT_AMBULATORY_CARE_PROVIDER_SITE_OTHER): Payer: Medicare Other | Admitting: Medical

## 2016-08-16 VITALS — BP 122/78 | HR 54 | Temp 97.6°F | Ht 68.0 in | Wt 171.2 lb

## 2016-08-16 DIAGNOSIS — Z79899 Other long term (current) drug therapy: Secondary | ICD-10-CM

## 2016-08-16 DIAGNOSIS — N3000 Acute cystitis without hematuria: Secondary | ICD-10-CM | POA: Diagnosis not present

## 2016-08-16 DIAGNOSIS — F32A Depression, unspecified: Secondary | ICD-10-CM

## 2016-08-16 DIAGNOSIS — M8580 Other specified disorders of bone density and structure, unspecified site: Secondary | ICD-10-CM | POA: Diagnosis not present

## 2016-08-16 DIAGNOSIS — F329 Major depressive disorder, single episode, unspecified: Secondary | ICD-10-CM | POA: Diagnosis not present

## 2016-08-16 DIAGNOSIS — F70 Mild intellectual disabilities: Secondary | ICD-10-CM

## 2016-08-16 DIAGNOSIS — Z7409 Other reduced mobility: Secondary | ICD-10-CM

## 2016-08-16 DIAGNOSIS — R413 Other amnesia: Secondary | ICD-10-CM

## 2016-08-16 MED ORDER — DENOSUMAB 60 MG/ML ~~LOC~~ SOLN
60.0000 mg | Freq: Once | SUBCUTANEOUS | Status: AC
  Administered 2016-08-16: 60 mg via SUBCUTANEOUS

## 2016-08-16 NOTE — Progress Notes (Signed)
Subjective: Chief Complaint  Patient presents with  . Follow-up    Prolia Injection   Here for f/u.  Here with caregiver from group home.   Is at [facility]  Here for Prolia injection for osteoporosis.   Last bone density test was 11/2015.   Her first Prolia injection was a little over 79mo ago.    Last visit on 08/01/16 we discussed memory loss.  She had +urine infection on culture, and at that time we cut Reglan in 1/2 and stopped Simvastatin to see if this would help her memory concerns.  Caregiver notes no major improvement in memory but she has been a little more active in group time and complaining less since last visit.   She has been enjoying the music room.    At last visit there was concern for eval for memory loss.  She had recently seen psychiatry, Lolita Cram, NP.  They wanted her to come in here for memory loss concerns, medical eval.   In recent weeks she forgets that she has eaten, she forgets to tell caregiver if she has fallen.  Seems to have recent change in memory.   She goes to day program but not necessarily participating.  They have art and reading room, music room.  Watches TV, likes [employer]her show.  Some days she eats 100% of meal, sometimes only 40-50% of meals.  No urinary symptoms.  More emotional at times, crying spells.    [name] is the only visitor but hasn't been in a month.   She has also seen cardiology since last visit.  Has echocardiogram coming up soon, and metoprolol was cut in half.  Past Medical History:  Diagnosis Date  . Allergy   . Anemia of chronic disease 10/2010   stable as of 2015, on iron therapy for mild iron deficiency, chronic kidney disease, anemia of chronic disease; Dr. Arline Asp prior consult  . Chronic kidney disease   . Constipation    mild intermittent  . Depression    [facility] - NP Lolita Cram  . Diabetes mellitus   . Diabetes type 2, controlled (HCC)   . Edentulous   . Falls   . GERD  (gastroesophageal reflux disease)   . H/O cardiovascular stress test 06/2014   nuclear stress test normal; Dr. Huston Foley  . H/O echocardiogram 06/17/14   mild LVH, EF 60-65%, no wall motion abnormalities  . H/O mammogram 08/04/2014   normal  . History of MRI of brain and brain stem 11/2010   normal MRI of brain  . Hx of deep venous thrombosis    lifelong anticoagulant therapy  . Hyperlipidemia   . Hypertension   . Hypertrophic obstructive cardiomyopathy (HOCM) (HCC) 2015   normal echo and stress test other than mild LVH 06/2014; Dr. Dietrich Pates  . Long term current use of anticoagulant therapy    due to hx/o recurrent DVT  . Mild mental retardation   . Moderate persistent asthma    asthma and bronchiectasis, pulm consult 2015; unable to do PFTs, 2015  . Mood disorder (HCC)    [facility] - NP Lolita Cram  . Osteoarthritis of both knees    Dr. Gean Birchwood  . Osteopenia 2013   improvements on Boniva and Ca+D from 2011-2013.  2013 Bone Density normal /improved; repeat Bone Density scan 2016  . PVD (peripheral vascular disease) (HCC)   . Shortness of breath    06/2014 cardiac consult, SOB seems  to be related to poor technique with handheld inhaler, switched to nebs with much improvement  . Thrombocytopenia (HCC) 10/2010   due to medications and immune dysregulation likely, stable as of 2015; no further investigation; hematology, Dr. Arline Asp  . Tremor 2015   consult with Dr. Mindi Slicker Neurology.  Parkinsonian tremor without Parkinsons   Current Outpatient Prescriptions on File Prior to Visit  Medication Sig Dispense Refill  . ACCU-CHEK AVIVA PLUS test strip CHECK BLOOD GLUCOSE (SUGAR) 1 OR 2 TIMESA WEEK 100 each 2  . albuterol (PROVENTIL) (2.5 MG/3ML) 0.083% nebulizer solution Take 3 mLs (2.5 mg total) by nebulization every 6 (six) hours as needed for wheezing or shortness of breath (dx J47.9    I42.2). 360 mL 5  . budesonide (PULMICORT) 0.5 MG/2ML nebulizer solution  TAKE 2 ml (0.5mg ) BY NEBULIZER TWICE DAILY 120 mL 11  . buPROPion (WELLBUTRIN SR) 150 MG 12 hr tablet Take 150 mg by mouth daily after breakfast.      . Calcium Carbonate-Vit D-Min (CALCIUM 1200) 1200-1000 MG-UNIT CHEW Chew 1,000 mg by mouth once. 90 each 3  . divalproex (DEPAKOTE) 250 MG DR tablet Take 500 mg by mouth 2 (two) times daily.     Marland Kitchen docusate sodium (COLACE) 100 MG capsule Take 1 capsule (100 mg total) by mouth 2 (two) times daily. Prn 60 capsule 11  . ferrous sulfate 325 (65 FE) MG tablet Take 1 tablet (325 mg total) by mouth daily with breakfast. 90 tablet 3  . FLUoxetine (PROZAC) 10 MG capsule Take 10 mg by mouth daily. 10mg  plus 40 mg am    . FLUoxetine (PROZAC) 40 MG capsule Take 40 mg by mouth daily.      . fluticasone (FLONASE) 50 MCG/ACT nasal spray Place 2 sprays into both nostrils daily. 16 g 11  . furosemide (LASIX) 40 MG tablet Take 0.5 tablets (20 mg total) by mouth daily. 90 tablet 3  . HYDROcodone-acetaminophen (NORCO) 5-325 MG tablet Take 1 tablet by mouth 2 (two) times daily. 180 tablet 0  . isosorbide mononitrate (IMDUR) 30 MG 24 hr tablet Take 1 tablet (30 mg total) by mouth daily. 90 tablet 3  . loratadine (QC LORATADINE ALLERGY RELIEF) 10 MG tablet Take 1 tablet (10 mg total) by mouth daily. 90 tablet 3  . LORazepam (ATIVAN) 0.5 MG tablet Take 0.5 mg by mouth 2 (two) times daily. And as needed    . metoCLOPramide (REGLAN) 5 MG tablet Take 1 tablet (5 mg total) by mouth daily. 90 tablet 3  . metoprolol tartrate (LOPRESSOR) 25 MG tablet Take 0.5 tablets (12.5 mg total) by mouth 2 (two) times daily. 180 tablet 3  . montelukast (SINGULAIR) 10 MG tablet Take 1 tablet (10 mg total) by mouth at bedtime. 90 tablet 3  . Multiple Vitamin (MULTIVITAMIN) tablet Take 1 tablet by mouth daily.    Marland Kitchen omeprazole (PRILOSEC) 20 MG capsule Take 1 capsule (20 mg total) by mouth daily. 90 capsule 3  . potassium chloride (K-DUR) 10 MEQ tablet Take 1 tablet (10 mEq total) by mouth daily.  90 tablet 3  . PRODIGY TWIST TOP LANCETS 28G MISC TWICE weekly 100 each 2  . PROLIA 60 MG/ML SOLN injection 1 mL by Subconjunctival route every 6 (six) months.    . rivaroxaban (XARELTO) 20 MG TABS tablet Take 1 tablet (20 mg total) by mouth daily with supper. 90 tablet 3  . silver sulfADIAZINE (SILVADENE) 1 % cream Apply 1 application topically daily. 50 g 0  .  simvastatin (ZOCOR) 20 MG tablet Take 1 tablet (20 mg total) by mouth daily at 6 PM. 90 tablet 3   No current facility-administered medications on file prior to visit.    ROS subjective    Objective: BP 122/78 (BP Location: Left Arm, Patient Position: Sitting, Cuff Size: Normal)   Pulse (!) 54   Temp 97.6 F (36.4 C) (Oral)   Ht 5\' 8"  (1.727 m)   Wt 171 lb 3.2 oz (77.7 kg)   SpO2 96%   BMI 26.03 kg/m   General appearance: alert, no distress, WD/WN, white female Neurological: alert, action tremor noted, strength normal upper extremities and lower extremities, sensation normal throughout, DTRs 2+ throughout, no cerebellar signs, gait normal Psychiatric: pleasant, more interactive today than prior visit.    Assessment:. Encounter Diagnoses  Name Primary?  . Osteopenia, unspecified location Yes  . Memory change   . Depression, unspecified depression type   . Impaired mobility   . Mild mental retardation   . Acute cystitis without hematuria   . High risk medication use      Plan: Prolia injection given IM today.  Memory change - referral to neurology but c/t changes made last time.  We stopped statin, cut Reglan in 1/2 last time and treated her for the + UTI on culture.   She couldn't give urine sample today  We will have her drop by for clean catch UA at her convenience.   She seems more smiling and a little more of her character shining through today.  Last visit she seemed more down and flat affect, but seems to be more spry today.   I hope she continues this trend.      Darral Dashsther was seen today for  follow-up.  Diagnoses and all orders for this visit:  Osteopenia, unspecified location  Memory change -     Ambulatory referral to Neurology  Depression, unspecified depression type  Impaired mobility  Mild mental retardation  Acute cystitis without hematuria  High risk medication use

## 2016-08-16 NOTE — Telephone Encounter (Signed)
This already in epic

## 2016-08-16 NOTE — Telephone Encounter (Signed)
Refer back to Dr. Danae Orleans at [facility] for memory loss, tremor.  Make sure to note that they look at recent labs, notes.

## 2016-08-18 ENCOUNTER — Telehealth: Payer: Self-pay | Admitting: Medical

## 2016-08-18 NOTE — Telephone Encounter (Signed)
P.A. BUDESONIDE

## 2016-08-18 NOTE — Telephone Encounter (Signed)
P.A. ALBUTEROL

## 2016-08-19 ENCOUNTER — Other Ambulatory Visit (HOSPITAL_COMMUNITY): Payer: Self-pay

## 2016-08-20 ENCOUNTER — Encounter: Payer: Self-pay | Admitting: Internal Medicine

## 2016-08-21 ENCOUNTER — Other Ambulatory Visit (HOSPITAL_COMMUNITY): Payer: Self-pay

## 2016-09-03 DIAGNOSIS — F71 Moderate intellectual disabilities: Secondary | ICD-10-CM | POA: Diagnosis not present

## 2016-09-03 DIAGNOSIS — F329 Major depressive disorder, single episode, unspecified: Secondary | ICD-10-CM | POA: Diagnosis not present

## 2016-09-03 DIAGNOSIS — F4321 Adjustment disorder with depressed mood: Secondary | ICD-10-CM | POA: Diagnosis not present

## 2016-09-04 ENCOUNTER — Ambulatory Visit (INDEPENDENT_AMBULATORY_CARE_PROVIDER_SITE_OTHER): Payer: Medicare Other | Admitting: Diagnostic Neuroimaging

## 2016-09-04 ENCOUNTER — Encounter: Payer: Self-pay | Admitting: Diagnostic Neuroimaging

## 2016-09-04 VITALS — BP 148/110 | HR 136 | Ht 73.0 in | Wt 174.2 lb

## 2016-09-04 DIAGNOSIS — F03C0 Unspecified dementia, severe, without behavioral disturbance, psychotic disturbance, mood disturbance, and anxiety: Secondary | ICD-10-CM

## 2016-09-04 DIAGNOSIS — F79 Unspecified intellectual disabilities: Secondary | ICD-10-CM

## 2016-09-04 DIAGNOSIS — F329 Major depressive disorder, single episode, unspecified: Secondary | ICD-10-CM | POA: Diagnosis not present

## 2016-09-04 DIAGNOSIS — F29 Unspecified psychosis not due to a substance or known physiological condition: Secondary | ICD-10-CM | POA: Diagnosis not present

## 2016-09-04 DIAGNOSIS — F32A Depression, unspecified: Secondary | ICD-10-CM

## 2016-09-04 DIAGNOSIS — F0390 Unspecified dementia without behavioral disturbance: Secondary | ICD-10-CM

## 2016-09-04 DIAGNOSIS — G20 Parkinson's disease: Secondary | ICD-10-CM

## 2016-09-04 DIAGNOSIS — R413 Other amnesia: Secondary | ICD-10-CM | POA: Diagnosis not present
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
## 2016-09-09 ENCOUNTER — Other Ambulatory Visit: Payer: Self-pay

## 2016-09-09 ENCOUNTER — Encounter: Payer: Self-pay | Admitting: Physician Assistant

## 2016-09-09 ENCOUNTER — Telehealth: Payer: Self-pay | Admitting: *Deleted

## 2016-09-09 ENCOUNTER — Other Ambulatory Visit: Payer: Self-pay | Admitting: Medical

## 2016-09-09 ENCOUNTER — Telehealth: Payer: Self-pay

## 2016-09-09 ENCOUNTER — Ambulatory Visit (HOSPITAL_COMMUNITY): Payer: Medicare Other | Attending: Cardiology

## 2016-09-09 DIAGNOSIS — J449 Chronic obstructive pulmonary disease, unspecified: Secondary | ICD-10-CM | POA: Insufficient documentation

## 2016-09-09 DIAGNOSIS — R5383 Other fatigue: Secondary | ICD-10-CM | POA: Insufficient documentation

## 2016-09-09 DIAGNOSIS — Z87891 Personal history of nicotine dependence: Secondary | ICD-10-CM | POA: Diagnosis not present

## 2016-09-09 DIAGNOSIS — E119 Type 2 diabetes mellitus without complications: Secondary | ICD-10-CM | POA: Diagnosis not present

## 2016-09-09 DIAGNOSIS — I10 Essential (primary) hypertension: Secondary | ICD-10-CM | POA: Insufficient documentation

## 2016-09-09 DIAGNOSIS — G894 Chronic pain syndrome: Secondary | ICD-10-CM

## 2016-09-09 DIAGNOSIS — J4540 Moderate persistent asthma, uncomplicated: Secondary | ICD-10-CM

## 2016-09-09 DIAGNOSIS — J45909 Unspecified asthma, uncomplicated: Secondary | ICD-10-CM

## 2016-09-09 MED ORDER — ALBUTEROL SULFATE (2.5 MG/3ML) 0.083% IN NEBU: 2.5000 mg | INHALATION_SOLUTION | Freq: Four times a day (QID) | RESPIRATORY_TRACT | 1 refills | Status: DC | PRN

## 2016-09-09 MED ORDER — HYDROCODONE-ACETAMINOPHEN 5-325 MG PO TABS: 1.0000 | ORAL_TABLET | Freq: Two times a day (BID) | ORAL | 0 refills | Status: DC

## 2016-09-09 MED ORDER — BUDESONIDE 0.5 MG/2ML IN SUSP: RESPIRATORY_TRACT | 5 refills | Status: DC

## 2016-09-09 NOTE — Telephone Encounter (Signed)
Faxed request for Hydrocodone refill. Please call when ready.

## 2016-09-09 NOTE — Telephone Encounter (Signed)
rx ready 

## 2016-09-09 NOTE — Telephone Encounter (Signed)
Per Cover my meds, no P.A. Required, Per pharmacy went thru but they do need refills to new pharmacy [facility]  

## 2016-09-09 NOTE — Telephone Encounter (Signed)
Per Cover my meds, no P.A. Required, Per pharmacy went thru but they do need refills to new pharmacy [facility]

## 2016-09-09 NOTE — Telephone Encounter (Signed)
Lmtcb to go over echo results.  

## 2016-09-09 NOTE — Telephone Encounter (Signed)
Caregiver at facility where pt resides called stating that pt needs Hydorcodone at [facility]. Call them back at the facility at [phone] when script is ready for pick up

## 2016-09-10 DIAGNOSIS — F71 Moderate intellectual disabilities: Secondary | ICD-10-CM | POA: Diagnosis not present

## 2016-09-10 DIAGNOSIS — F4321 Adjustment disorder with depressed mood: Secondary | ICD-10-CM | POA: Diagnosis not present

## 2016-09-10 DIAGNOSIS — Z6822 Body mass index (BMI) 22.0-22.9, adult: Secondary | ICD-10-CM | POA: Diagnosis not present

## 2016-09-10 DIAGNOSIS — F329 Major depressive disorder, single episode, unspecified: Secondary | ICD-10-CM | POA: Diagnosis not present

## 2016-09-10 NOTE — Telephone Encounter (Signed)
DPR ok to s/w Rudy Jew, RN Health Service Coordinator in regards to results for the pt. Echo results given to [facility], EF normal, moderate diastolic dysfunction. Continue current Tx plan. I will fax results to PCP, as well as to the nursing facility ATT: Rudy Jew, RN fax # [phone] for pt's records there. [name] verbalized understanding to plan of care for the pt.

## 2016-10-11 ENCOUNTER — Other Ambulatory Visit: Payer: Self-pay

## 2016-10-15 ENCOUNTER — Other Ambulatory Visit: Payer: Self-pay

## 2016-10-15 DIAGNOSIS — E119 Type 2 diabetes mellitus without complications: Secondary | ICD-10-CM

## 2016-10-17 ENCOUNTER — Encounter: Payer: Self-pay | Admitting: Internal Medicine

## 2016-11-20 ENCOUNTER — Encounter: Payer: Self-pay | Admitting: Podiatry

## 2016-11-20 ENCOUNTER — Ambulatory Visit (INDEPENDENT_AMBULATORY_CARE_PROVIDER_SITE_OTHER): Payer: Medicare Other | Admitting: Podiatry

## 2016-11-20 DIAGNOSIS — E119 Type 2 diabetes mellitus without complications: Secondary | ICD-10-CM

## 2016-11-20 DIAGNOSIS — B351 Tinea unguium: Secondary | ICD-10-CM

## 2016-11-20 DIAGNOSIS — E118 Type 2 diabetes mellitus with unspecified complications: Secondary | ICD-10-CM | POA: Diagnosis not present

## 2016-11-20 NOTE — Patient Instructions (Signed)
Remove the Band-Aid on the fourth left toe in 1-3 days and continue applying topical antibiotic ointment and a Band-Aid daily until a scab forms Carrington Clamp DPM [facility] 11/20/2016  Diabetes and Foot Care Diabetes may cause you to have problems because of poor blood supply (circulation) to your feet and legs. This may cause the skin on your feet to become thinner, break easier, and heal more slowly. Your skin may become dry, and the skin may peel and crack. You may also have nerve damage in your legs and feet causing decreased feeling in them. You may not notice minor injuries to your feet that could lead to infections or more serious problems. Taking care of your feet is one of the most important things you can do for yourself. Follow these instructions at home:  Wear shoes at all times, even in the house. Do not go barefoot. Bare feet are easily injured.  Check your feet daily for blisters, cuts, and redness. If you cannot see the bottom of your feet, use a mirror or ask someone for help.  Wash your feet with warm water (do not use hot water) and mild soap. Then pat your feet and the areas between your toes until they are completely dry. Do not soak your feet as this can dry your skin.  Apply a moisturizing lotion or petroleum jelly (that does not contain alcohol and is unscented) to the skin on your feet and to dry, brittle toenails. Do not apply lotion between your toes.  Trim your toenails straight across. Do not dig under them or around the cuticle. File the edges of your nails with an emery board or nail file.  Do not cut corns or calluses or try to remove them with medicine.  Wear clean socks or stockings every day. Make sure they are not too tight. Do not wear knee-high stockings since they may decrease blood flow to your legs.  Wear shoes that fit properly and have enough cushioning. To break in new shoes, wear them for just a few hours a day. This prevents you from  injuring your feet. Always look in your shoes before you put them on to be sure there are no objects inside.  Do not cross your legs. This may decrease the blood flow to your feet.  If you find a minor scrape, cut, or break in the skin on your feet, keep it and the skin around it clean and dry. These areas may be cleansed with mild soap and water. Do not cleanse the area with peroxide, alcohol, or iodine.  When you remove an adhesive bandage, be sure not to damage the skin around it.  If you have a wound, look at it several times a day to make sure it is healing.  Do not use heating pads or hot water bottles. They may burn your skin. If you have lost feeling in your feet or legs, you may not know it is happening until it is too late.  Make sure your health care provider performs a complete foot exam at least annually or more often if you have foot problems. Report any cuts, sores, or bruises to your health care provider immediately. Contact a health care provider if:  You have an injury that is not healing.  You have cuts or breaks in the skin.  You have an ingrown nail.  You notice redness on your legs or feet.  You feel burning or tingling in your legs or  feet.  You have pain or cramps in your legs and feet.  Your legs or feet are numb.  Your feet always feel cold. Get help right away if:  There is increasing redness, swelling, or pain in or around a wound.  There is a red line that goes up your leg.  Pus is coming from a wound.  You develop a fever or as directed by your health care provider.  You notice a bad smell coming from an ulcer or wound. This information is not intended to replace advice given to you by your health care provider. Make sure you discuss any questions you have with your health care provider. Document Released: 10/11/2000 Document Revised: 03/21/2016 Document Reviewed: 03/23/2013 Elsevier Interactive Patient Education  2017 [employer].

## 2016-11-20 NOTE — Progress Notes (Signed)
Patient ID: [name], female   DOB: 04/17/1935, 82 y.o.   MRN: [id number]    Subjective: This patient presents for ongoing debridement of painful toenails. Patient is a diabetic with a history of mood disorders, abnormal involuntary movements. Patient lives in assisted living and patient's caregivers present to treatment room  Objective: Patient is confused and has difficulty responding to direct questioning DP and PT pulses 2/4 bilaterally Capillary reflex delayed bilaterally Sensation to 10 g monofilament wire difficult to assess patient has difficulty responding Sensation to tuning fork difficulty to assess patient has difficulty responding Ankle reflexes reactive bilaterally No open skin lesions bilaterally Atrophic skin bilateral The toenails 6-10 are elongated, brittle, discolored, hypertrophic and tender to direct palpation HAV bilaterally Hammertoe 2-Bilaterally Manual motor testing dorsi flexion, plantar flexion 5/5 bilaterally Patient walks slowly with walker Has involuntary upper and lower extremity motor movements  Assessment: Diabetic with satisfactory vascular status Neurological status hard to evaluate Symptomatic onychomycoses 6-10  Plan: Debridement toenails 6-10 mechanically and electrically without any bleeding  Reappoint 3 months 

## 2016-11-20 NOTE — Progress Notes (Signed)
   Subjective:    Patient ID: [name], female    DOB: 1934/11/19, 82 y.o.   MRN: [id number]  HPI   I am here to get my feet checked and my toenails are thick and discolored and sometimes they get numb and tender, sore and burns and throbs and I am a diabetic    Review of Systems  All other systems reviewed and are negative.      Objective:   Physical Exam        Assessment & Plan:

## 2016-11-21 DIAGNOSIS — M1712 Unilateral primary osteoarthritis, left knee: Secondary | ICD-10-CM | POA: Diagnosis not present

## 2016-11-21 DIAGNOSIS — M1711 Unilateral primary osteoarthritis, right knee: Secondary | ICD-10-CM | POA: Diagnosis not present

## 2016-12-02 ENCOUNTER — Ambulatory Visit (INDEPENDENT_AMBULATORY_CARE_PROVIDER_SITE_OTHER): Payer: Medicare Other | Admitting: Medical

## 2016-12-02 ENCOUNTER — Encounter: Payer: Self-pay | Admitting: Medical

## 2016-12-02 VITALS — BP 128/75 | HR 76 | Ht 71.5 in | Wt 160.2 lb

## 2016-12-02 DIAGNOSIS — Z86718 Personal history of other venous thrombosis and embolism: Secondary | ICD-10-CM

## 2016-12-02 DIAGNOSIS — J4540 Moderate persistent asthma, uncomplicated: Secondary | ICD-10-CM

## 2016-12-02 DIAGNOSIS — I422 Other hypertrophic cardiomyopathy: Secondary | ICD-10-CM | POA: Diagnosis not present

## 2016-12-02 DIAGNOSIS — R259 Unspecified abnormal involuntary movements: Secondary | ICD-10-CM | POA: Diagnosis not present

## 2016-12-02 DIAGNOSIS — N189 Chronic kidney disease, unspecified: Secondary | ICD-10-CM | POA: Diagnosis not present

## 2016-12-02 DIAGNOSIS — E118 Type 2 diabetes mellitus with unspecified complications: Secondary | ICD-10-CM

## 2016-12-02 DIAGNOSIS — D638 Anemia in other chronic diseases classified elsewhere: Secondary | ICD-10-CM

## 2016-12-02 DIAGNOSIS — R634 Abnormal weight loss: Secondary | ICD-10-CM | POA: Insufficient documentation

## 2016-12-02 DIAGNOSIS — G894 Chronic pain syndrome: Secondary | ICD-10-CM

## 2016-12-02 DIAGNOSIS — Z7901 Long term (current) use of anticoagulants: Secondary | ICD-10-CM | POA: Diagnosis not present

## 2016-12-02 DIAGNOSIS — M170 Bilateral primary osteoarthritis of knee: Secondary | ICD-10-CM | POA: Diagnosis not present

## 2016-12-02 DIAGNOSIS — R413 Other amnesia: Secondary | ICD-10-CM | POA: Diagnosis not present

## 2016-12-02 DIAGNOSIS — Z7409 Other reduced mobility: Secondary | ICD-10-CM

## 2016-12-02 DIAGNOSIS — F0630 Mood disorder due to known physiological condition, unspecified: Secondary | ICD-10-CM

## 2016-12-02 DIAGNOSIS — K219 Gastro-esophageal reflux disease without esophagitis: Secondary | ICD-10-CM

## 2016-12-02 DIAGNOSIS — F32A Depression, unspecified: Secondary | ICD-10-CM

## 2016-12-02 DIAGNOSIS — F70 Mild intellectual disabilities: Secondary | ICD-10-CM

## 2016-12-02 DIAGNOSIS — F329 Major depressive disorder, single episode, unspecified: Secondary | ICD-10-CM | POA: Diagnosis not present

## 2016-12-02 DIAGNOSIS — Z0000 Encounter for general adult medical examination without abnormal findings: Secondary | ICD-10-CM

## 2016-12-02 DIAGNOSIS — Z79899 Other long term (current) drug therapy: Secondary | ICD-10-CM

## 2016-12-02 DIAGNOSIS — R296 Repeated falls: Secondary | ICD-10-CM

## 2016-12-02 DIAGNOSIS — L89311 Pressure ulcer of right buttock, stage 1: Secondary | ICD-10-CM

## 2016-12-02 DIAGNOSIS — M8580 Other specified disorders of bone density and structure, unspecified site: Secondary | ICD-10-CM

## 2016-12-02 DIAGNOSIS — R630 Anorexia: Secondary | ICD-10-CM | POA: Insufficient documentation

## 2016-12-02 LAB — RENAL FUNCTION PANEL
Albumin: 3.7 g/dL (ref 3.6–5.1)
BUN: 23 mg/dL (ref 7–25)
CHLORIDE: 104 mmol/L (ref 98–110)
CO2: 26 mmol/L (ref 20–31)
CREATININE: 1.53 mg/dL — AB (ref 0.60–0.93)
Calcium: 9 mg/dL (ref 8.6–10.4)
GLUCOSE: 90 mg/dL (ref 65–99)
POTASSIUM: 4.8 mmol/L (ref 3.5–5.3)
Phosphorus: 2.7 mg/dL (ref 2.1–4.3)
Sodium: 140 mmol/L (ref 135–146)

## 2016-12-02 LAB — POCT URINALYSIS DIPSTICK
GLUCOSE UA: NEGATIVE
KETONES UA: NEGATIVE
Nitrite, UA: NEGATIVE
Protein, UA: NEGATIVE
RBC UA: NEGATIVE
SPEC GRAV UA: 1.025
UROBILINOGEN UA: NEGATIVE
pH, UA: 6

## 2016-12-02 LAB — CBC WITH DIFFERENTIAL/PLATELET
BASOS ABS: 0 {cells}/uL (ref 0–200)
BASOS PCT: 0 %
EOS ABS: 106 {cells}/uL (ref 15–500)
Eosinophils Relative: 2 %
HEMATOCRIT: 35 % (ref 35.0–45.0)
Hemoglobin: 11.7 g/dL (ref 11.7–15.5)
Lymphocytes Relative: 25 %
Lymphs Abs: 1325 cells/uL (ref 850–3900)
MCH: 30.5 pg (ref 27.0–33.0)
MCHC: 33.4 g/dL (ref 32.0–36.0)
MCV: 91.4 fL (ref 80.0–100.0)
MONO ABS: 636 {cells}/uL (ref 200–950)
MONOS PCT: 12 %
MPV: 11.2 fL (ref 7.5–12.5)
NEUTROS ABS: 3233 {cells}/uL (ref 1500–7800)
Neutrophils Relative %: 61 %
PLATELETS: 123 10*3/uL — AB (ref 140–400)
RBC: 3.83 MIL/uL (ref 3.80–5.10)
RDW: 13.3 % (ref 11.0–15.0)
WBC: 5.3 10*3/uL (ref 4.0–10.5)

## 2016-12-02 LAB — IRON: Iron: 140 ug/dL (ref 45–160)

## 2016-12-02 LAB — FERRITIN: FERRITIN: 512 ng/mL — AB (ref 20–288)
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
## 2016-12-03 LAB — HEMOGLOBIN A1C
HEMOGLOBIN A1C: 5.8 % — AB (ref <5.7)
Mean Plasma Glucose: 120 mg/dL

## 2016-12-04 ENCOUNTER — Other Ambulatory Visit: Payer: Self-pay | Admitting: Medical

## 2016-12-04 DIAGNOSIS — Z0000 Encounter for general adult medical examination without abnormal findings: Secondary | ICD-10-CM | POA: Insufficient documentation

## 2016-12-04 MED ORDER — ONE-DAILY MULTI VITAMINS PO TABS: 1.0000 | ORAL_TABLET | Freq: Every day | ORAL | 3 refills | Status: AC

## 2016-12-04 MED ORDER — METOPROLOL TARTRATE 25 MG PO TABS
12.5000 mg | ORAL_TABLET | Freq: Two times a day (BID) | ORAL | 3 refills | Status: DC
Start: 2016-12-04 — End: 2016-12-05

## 2016-12-04 MED ORDER — ISOSORBIDE MONONITRATE ER 30 MG PO TB24: 30.0000 mg | ORAL_TABLET | Freq: Every day | ORAL | 3 refills | Status: AC

## 2016-12-04 MED ORDER — HYDROCODONE-ACETAMINOPHEN 5-325 MG PO TABS: 1.0000 | ORAL_TABLET | Freq: Two times a day (BID) | ORAL | 0 refills | Status: DC

## 2016-12-04 MED ORDER — FUROSEMIDE 20 MG PO TABS: 20.0000 mg | ORAL_TABLET | Freq: Every day | ORAL | 3 refills | Status: AC

## 2016-12-04 MED ORDER — MONTELUKAST SODIUM 10 MG PO TABS: 10.0000 mg | ORAL_TABLET | Freq: Every day | ORAL | 3 refills | Status: DC

## 2016-12-04 MED ORDER — SILVER SULFADIAZINE 1 % EX CREA: 1.0000 "application " | TOPICAL_CREAM | Freq: Every day | CUTANEOUS | 1 refills | Status: DC

## 2016-12-04 MED ORDER — GLUCOSE BLOOD VI STRP
ORAL_STRIP | 2 refills | Status: AC
Start: 2016-12-04 — End: ?

## 2016-12-04 MED ORDER — FLUTICASONE PROPIONATE 50 MCG/ACT NA SUSP: 2.0000 | Freq: Every day | NASAL | 11 refills | Status: AC

## 2016-12-04 MED ORDER — METOCLOPRAMIDE HCL 5 MG PO TABS: 5.0000 mg | ORAL_TABLET | Freq: Every day | ORAL | 3 refills | Status: DC

## 2016-12-04 MED ORDER — ALBUTEROL SULFATE (2.5 MG/3ML) 0.083% IN NEBU: 2.5000 mg | INHALATION_SOLUTION | Freq: Four times a day (QID) | RESPIRATORY_TRACT | 2 refills | Status: AC | PRN

## 2016-12-04 MED ORDER — DOCUSATE SODIUM 100 MG PO CAPS: 100.0000 mg | ORAL_CAPSULE | Freq: Two times a day (BID) | ORAL | 11 refills | Status: AC

## 2016-12-04 MED ORDER — BUDESONIDE 0.5 MG/2ML IN SUSP
RESPIRATORY_TRACT | 11 refills | Status: AC
Start: 2016-12-04 — End: ?

## 2016-12-04 MED ORDER — RIVAROXABAN 20 MG PO TABS: 20.0000 mg | ORAL_TABLET | Freq: Every day | ORAL | 3 refills | Status: DC

## 2016-12-04 MED ORDER — LORATADINE 10 MG PO TABS: 10.0000 mg | ORAL_TABLET | Freq: Every day | ORAL | 3 refills | Status: AC

## 2016-12-04 MED ORDER — OMEPRAZOLE 20 MG PO CPDR: 20.0000 mg | DELAYED_RELEASE_CAPSULE | Freq: Every day | ORAL | 3 refills | Status: DC

## 2016-12-04 MED ORDER — PRODIGY TWIST TOP LANCETS 28G MISC: 2 refills | Status: DC

## 2016-12-04 MED ORDER — POTASSIUM CHLORIDE ER 10 MEQ PO TBCR: 10.0000 meq | EXTENDED_RELEASE_TABLET | Freq: Every day | ORAL | 3 refills | Status: DC

## 2016-12-04 MED ORDER — SIMVASTATIN 20 MG PO TABS: 20.0000 mg | ORAL_TABLET | Freq: Every day | ORAL | 3 refills | Status: DC

## 2016-12-04 NOTE — Addendum Note (Signed)
Addended by: Jac Canavan on: 12/04/2016 08:53 AM   Modules accepted: Level of Service

## 2016-12-04 NOTE — Addendum Note (Signed)
Addended by: Jac Canavan on: 12/04/2016 05:04 PM   Modules accepted: Orders

## 2016-12-05 ENCOUNTER — Telehealth: Payer: Self-pay

## 2016-12-05 DIAGNOSIS — E118 Type 2 diabetes mellitus with unspecified complications: Secondary | ICD-10-CM

## 2016-12-05 MED ORDER — MONTELUKAST SODIUM 10 MG PO TABS: 10.0000 mg | ORAL_TABLET | Freq: Every day | ORAL | 3 refills | Status: AC

## 2016-12-05 MED ORDER — SIMVASTATIN 20 MG PO TABS: 20.0000 mg | ORAL_TABLET | Freq: Every day | ORAL | 3 refills | Status: DC

## 2016-12-05 MED ORDER — OMEPRAZOLE 20 MG PO CPDR: 20.0000 mg | DELAYED_RELEASE_CAPSULE | Freq: Every day | ORAL | 3 refills | Status: AC

## 2016-12-05 MED ORDER — DIVALPROEX SODIUM 250 MG PO DR TAB: 500.0000 mg | DELAYED_RELEASE_TABLET | Freq: Two times a day (BID) | ORAL | 3 refills | Status: DC

## 2016-12-05 MED ORDER — METOPROLOL TARTRATE 25 MG PO TABS: 12.5000 mg | ORAL_TABLET | Freq: Two times a day (BID) | ORAL | 3 refills | Status: AC

## 2016-12-05 MED ORDER — PRODIGY TWIST TOP LANCETS 28G MISC: 2 refills | Status: AC

## 2016-12-05 MED ORDER — POTASSIUM CHLORIDE ER 10 MEQ PO TBCR: 10.0000 meq | EXTENDED_RELEASE_TABLET | Freq: Every day | ORAL | 3 refills | Status: DC

## 2016-12-05 NOTE — Telephone Encounter (Signed)
Pt needs refill of hydrocodone. Please call [name] at [phone]. [name]

## 2016-12-06 NOTE — Telephone Encounter (Signed)
done

## 2016-12-07 NOTE — Patient Instructions (Addendum)
RECOMMENDATIONS   Medications: Asthma  Continue Pulmicort nebulized treatment twice daily for prevention of asthma flare up  continue Albuterol nebulized treatment as needed up to every 6 hours   Heart disease  Continue Lasix/Furosemide 20mg  daily in the morning  Continue K-dur/potassium chloride daily in the morning along with Lasix  Continue Metoprolol/Lopressor 25mg , 1/2 tablet twice daily  Continue Isosorbide Mononitrate 30mg  daily in the morning  Allergies  continue Singulair 10mg , 1 tablet daily at bedtime  Continue Flonase/Fluticasone nasal spray, 2 sprays per nostril daily in the morning for allergies  Continue Loratadine 10mg  tablet daily in the morning for allergies  Diabetes  Check glucose fasting in the morning on Monday and Thursday.   Fasting glucose should be between 70-130. If routinely seeing numbers higher or lower than this range, let doctor know  High cholesterol  Continue Zocor/Simvastatin 20mg  tablet daily at bedtime  History of blood clots  Continue Xarelto/Rivaroxaban 20mg  tablet daily with evening meal  Bed sore/ulcer on the right buttock  Keep this clean daily with soap and water  Use Silvadene cream/Silver Sulfadiazine once daily to this area  If the area is getting bigger, redder, deeper, or worse in the next week, then recheck  GI issues - Acid reflux/GERD, constipation:  Continue Metoclopramide/Reglan 5mg  tablet daily in the moring  continue Colace/Docusate 100mg  capsule, once to twice daily as needed for constipation  Continue Omeprazole/Prilosec 20mg  daily in the morning  Pain/joint pain  Continue Norco/Hydrocodone 5/325mg  tablet twice daily for pain  Continue daily multivitamin in the morning  Osteopenia   Continue Prolia injection every 6 months  STOP/discontinue iron/Ferrous gluconate STOP/discontinue Lasix 40mg  when she runs out as I changed the dose to 20mg  tablet so you can stop breaking them in  half  Depression - these medications are prescribed by her psychiatrist: Depakote Prozac Lorazepam   Recheck in 2 weeks on the bed sore, weight loss!

## 2016-12-13 ENCOUNTER — Telehealth: Payer: Self-pay | Admitting: Medical

## 2016-12-13 NOTE — Telephone Encounter (Signed)
[name] _0  [employer] called stating that she just picked up info from our office and she is understanding the info to state that pt should not be Iron at all ow. Is this correct? If so, Kennyth Lose will need an order to remove Iron from her med list so that she can update her MMR. Call [place] back at [phone]

## 2016-12-16 NOTE — Telephone Encounter (Signed)
Yes, that is correct, I discontinued iron/ferrous this recent visit.  Make sure they read over the AV summary and recommendations to see if any other concerns

## 2016-12-30 NOTE — Telephone Encounter (Signed)
Resent insurance verification for next Prolia injection

## 2017-01-01 ENCOUNTER — Other Ambulatory Visit: Payer: Self-pay

## 2017-01-01 DIAGNOSIS — G894 Chronic pain syndrome: Secondary | ICD-10-CM

## 2017-01-01 MED ORDER — HYDROCODONE-ACETAMINOPHEN 5-325 MG PO TABS: 1.0000 | ORAL_TABLET | Freq: Two times a day (BID) | ORAL | 0 refills | Status: DC

## 2017-01-01 MED ORDER — HYDROCODONE-ACETAMINOPHEN 5-325 MG PO TABS
1.0000 | ORAL_TABLET | Freq: Two times a day (BID) | ORAL | 0 refills | Status: DC
Start: 2017-01-01 — End: 2017-01-01

## 2017-01-01 MED ORDER — HYDROCODONE-ACETAMINOPHEN 5-325 MG PO TABS
1.0000 | ORAL_TABLET | Freq: Two times a day (BID) | ORAL | 0 refills | Status: DC
Start: 2017-02-01 — End: 2017-01-01

## 2017-02-05 ENCOUNTER — Telehealth: Payer: Self-pay | Admitting: Medical

## 2017-02-05 NOTE — Telephone Encounter (Signed)
Left message for nurse that pt needs Prolia injection

## 2017-02-11 ENCOUNTER — Telehealth: Payer: Self-pay | Admitting: Medical

## 2017-02-11 NOTE — Telephone Encounter (Signed)
Prolia approved. Pt will not owe any money. Prolia injection scheduled for 02/17/17. Medication ordered from [facility] on 02/10/2017.

## 2017-02-17 ENCOUNTER — Telehealth: Payer: Self-pay | Admitting: Medical

## 2017-02-17 ENCOUNTER — Other Ambulatory Visit (INDEPENDENT_AMBULATORY_CARE_PROVIDER_SITE_OTHER): Payer: Medicare Other

## 2017-02-17 DIAGNOSIS — Q782 Osteopetrosis: Secondary | ICD-10-CM

## 2017-02-17 MED ORDER — DENOSUMAB 60 MG/ML ~~LOC~~ SOLN
60.0000 mg | Freq: Once | SUBCUTANEOUS | Status: AC
  Administered 2017-02-17: 60 mg via SUBCUTANEOUS

## 2017-02-17 NOTE — Telephone Encounter (Signed)
Pt came in and received prollia

## 2017-02-19 ENCOUNTER — Ambulatory Visit (INDEPENDENT_AMBULATORY_CARE_PROVIDER_SITE_OTHER): Payer: Medicare Other | Admitting: Podiatry

## 2017-02-19 ENCOUNTER — Telehealth: Payer: Self-pay | Admitting: Family Medicine

## 2017-02-19 DIAGNOSIS — B351 Tinea unguium: Secondary | ICD-10-CM | POA: Diagnosis not present

## 2017-02-19 DIAGNOSIS — E119 Type 2 diabetes mellitus without complications: Secondary | ICD-10-CM | POA: Diagnosis not present

## 2017-02-19 NOTE — Patient Instructions (Signed)
Return every 3 months for debridement of mycotic toenails Carrington Clamp DPM [facility] 02/19/2017    Diabetes and Foot Care Diabetes may cause you to have problems because of poor blood supply (circulation) to your feet and legs. This may cause the skin on your feet to become thinner, break easier, and heal more slowly. Your skin may become dry, and the skin may peel and crack. You may also have nerve damage in your legs and feet causing decreased feeling in them. You may not notice minor injuries to your feet that could lead to infections or more serious problems. Taking care of your feet is one of the most important things you can do for yourself. Follow these instructions at home:  Wear shoes at all times, even in the house. Do not go barefoot. Bare feet are easily injured.  Check your feet daily for blisters, cuts, and redness. If you cannot see the bottom of your feet, use a mirror or ask someone for help.  Wash your feet with warm water (do not use hot water) and mild soap. Then pat your feet and the areas between your toes until they are completely dry. Do not soak your feet as this can dry your skin.  Apply a moisturizing lotion or petroleum jelly (that does not contain alcohol and is unscented) to the skin on your feet and to dry, brittle toenails. Do not apply lotion between your toes.  Trim your toenails straight across. Do not dig under them or around the cuticle. File the edges of your nails with an emery board or nail file.  Do not cut corns or calluses or try to remove them with medicine.  Wear clean socks or stockings every day. Make sure they are not too tight. Do not wear knee-high stockings since they may decrease blood flow to your legs.  Wear shoes that fit properly and have enough cushioning. To break in new shoes, wear them for just a few hours a day. This prevents you from injuring your feet. Always look in your shoes before you put them on to be sure  there are no objects inside.  Do not cross your legs. This may decrease the blood flow to your feet.  If you find a minor scrape, cut, or break in the skin on your feet, keep it and the skin around it clean and dry. These areas may be cleansed with mild soap and water. Do not cleanse the area with peroxide, alcohol, or iodine.  When you remove an adhesive bandage, be sure not to damage the skin around it.  If you have a wound, look at it several times a day to make sure it is healing.  Do not use heating pads or hot water bottles. They may burn your skin. If you have lost feeling in your feet or legs, you may not know it is happening until it is too late.  Make sure your health care provider performs a complete foot exam at least annually or more often if you have foot problems. Report any cuts, sores, or bruises to your health care provider immediately. Contact a health care provider if:  You have an injury that is not healing.  You have cuts or breaks in the skin.  You have an ingrown nail.  You notice redness on your legs or feet.  You feel burning or tingling in your legs or feet.  You have pain or cramps in your legs and feet.  Your legs or feet are numb.  Your feet always feel cold. Get help right away if:  There is increasing redness, swelling, or pain in or around a wound.  There is a red line that goes up your leg.  Pus is coming from a wound.  You develop a fever or as directed by your health care provider.  You notice a bad smell coming from an ulcer or wound. This information is not intended to replace advice given to you by your health care provider. Make sure you discuss any questions you have with your health care provider. Document Released: 10/11/2000 Document Revised: 03/21/2016 Document Reviewed: 03/23/2013 Elsevier Interactive Patient Education  2017 [employer].

## 2017-02-19 NOTE — Progress Notes (Signed)
Patient ID: [name], female   DOB: Feb 25, 1935, 82 y.o.   MRN: [id number]    Subjective: This patient presents for ongoing debridement of painful toenails. Patient is a diabetic with a history of mood disorders, abnormal involuntary movements. Patient lives in assisted living and patient's caregivers present to treatment room  Objective: Patient is confused and has difficulty responding to direct questioning DP and PT pulses 2/4 bilaterally Capillary reflex delayed bilaterally Sensation to 10 g monofilament wire difficult to assess patient has difficulty responding Sensation to tuning fork difficulty to assess patient has difficulty responding Ankle reflexes reactive bilaterally No open skin lesions bilaterally Atrophic skin bilateral Absent hair growth bilaterally The toenails 6-10 are elongated, brittle, discolored, hypertrophic and tender to direct palpation HAV bilaterally Hammertoe 2-Bilaterally Manual motor testing dorsi flexion, plantar flexion 5/5 bilaterally Patient walks slowly with walker Has involuntary upper and lower extremity motor movements  Assessment: Diabetic with satisfactory vascular status Neurological status hard to evaluate Symptomatic onychomycoses 6-10  Plan: Debridement toenails 6-10 mechanically and electrically without any bleeding  Reappoint 3 months

## 2017-02-19 NOTE — Telephone Encounter (Signed)
[facility] request to change the Omeprozole to Zeqrid.  Paper in your folder.

## 2017-02-23 ENCOUNTER — Telehealth: Payer: Self-pay | Admitting: Medical

## 2017-02-23 NOTE — Telephone Encounter (Addendum)
P.A. ZEGERID (OMEPRAZOLE/SODIUM BICARBONATE)

## 2017-02-24 NOTE — Telephone Encounter (Signed)
Called [facility] regarding change from Omeprazole to Zegerid and find out why since insurance doesn't want to pay for it and if approved states will be a higher co pay.  S/w pharmacist and they do a review of pt's with multiple stomach meds and try to compose them and the goal is to D/C the omep and eventually the Reglan and just have her on Zegerid if works for our pt.  Per pharmacist pt has tried other PPI's and failed and I completed P.A. Zegerid, Omeprazole/sodium bicarbonate & was approved.  Called [facility] pharmacist back & went thru for $1.25.

## 2017-03-06 DIAGNOSIS — M1712 Unilateral primary osteoarthritis, left knee: Secondary | ICD-10-CM | POA: Diagnosis not present

## 2017-03-06 DIAGNOSIS — M1711 Unilateral primary osteoarthritis, right knee: Secondary | ICD-10-CM | POA: Diagnosis not present

## 2017-04-04 ENCOUNTER — Telehealth: Payer: Self-pay

## 2017-04-04 DIAGNOSIS — E118 Type 2 diabetes mellitus with unspecified complications: Secondary | ICD-10-CM

## 2017-04-04 MED ORDER — SIMVASTATIN 20 MG PO TABS: 20.0000 mg | ORAL_TABLET | Freq: Every day | ORAL | 0 refills | Status: DC

## 2017-04-04 NOTE — Telephone Encounter (Signed)
rx renewed.

## 2017-04-04 NOTE — Telephone Encounter (Signed)
Pt needs 90 day refill of Simvastatin called to [facility]. Trixie Rude

## 2017-04-07 ENCOUNTER — Telehealth: Payer: Self-pay | Admitting: Medical

## 2017-04-07 NOTE — Telephone Encounter (Signed)
[facility] called and states that pt needs a refill on her hydrocodone pt is out please call jackie at [phone] when ready to be picked up

## 2017-04-07 NOTE — Telephone Encounter (Signed)
Left message for nurse.

## 2017-04-07 NOTE — Telephone Encounter (Signed)
Lets get her in for 52mo follow up on diabetes, medication, and pain medication

## 2017-04-08 ENCOUNTER — Encounter: Payer: Self-pay | Admitting: Medical

## 2017-04-08 ENCOUNTER — Ambulatory Visit (INDEPENDENT_AMBULATORY_CARE_PROVIDER_SITE_OTHER): Payer: Medicare Other | Admitting: Medical

## 2017-04-08 VITALS — BP 116/74 | HR 88 | Wt 146.6 lb

## 2017-04-08 DIAGNOSIS — Z79899 Other long term (current) drug therapy: Secondary | ICD-10-CM

## 2017-04-08 DIAGNOSIS — R634 Abnormal weight loss: Secondary | ICD-10-CM

## 2017-04-08 DIAGNOSIS — J301 Allergic rhinitis due to pollen: Secondary | ICD-10-CM | POA: Diagnosis not present

## 2017-04-08 DIAGNOSIS — E118 Type 2 diabetes mellitus with unspecified complications: Secondary | ICD-10-CM

## 2017-04-08 DIAGNOSIS — Z86718 Personal history of other venous thrombosis and embolism: Secondary | ICD-10-CM | POA: Diagnosis not present

## 2017-04-08 DIAGNOSIS — S40811A Abrasion of right upper arm, initial encounter: Secondary | ICD-10-CM

## 2017-04-08 DIAGNOSIS — F70 Mild intellectual disabilities: Secondary | ICD-10-CM

## 2017-04-08 DIAGNOSIS — R259 Unspecified abnormal involuntary movements: Secondary | ICD-10-CM | POA: Diagnosis not present

## 2017-04-08 DIAGNOSIS — R296 Repeated falls: Secondary | ICD-10-CM | POA: Diagnosis not present

## 2017-04-08 DIAGNOSIS — M545 Low back pain, unspecified: Secondary | ICD-10-CM

## 2017-04-08 DIAGNOSIS — D692 Other nonthrombocytopenic purpura: Secondary | ICD-10-CM | POA: Diagnosis not present

## 2017-04-08 DIAGNOSIS — G8929 Other chronic pain: Secondary | ICD-10-CM

## 2017-04-08 DIAGNOSIS — R630 Anorexia: Secondary | ICD-10-CM

## 2017-04-08 DIAGNOSIS — I739 Peripheral vascular disease, unspecified: Secondary | ICD-10-CM

## 2017-04-08 DIAGNOSIS — J4540 Moderate persistent asthma, uncomplicated: Secondary | ICD-10-CM

## 2017-04-08 DIAGNOSIS — Z7901 Long term (current) use of anticoagulants: Secondary | ICD-10-CM

## 2017-04-08 DIAGNOSIS — S40021A Contusion of right upper arm, initial encounter: Secondary | ICD-10-CM | POA: Insufficient documentation

## 2017-04-08 DIAGNOSIS — I10 Essential (primary) hypertension: Secondary | ICD-10-CM

## 2017-04-08 DIAGNOSIS — D638 Anemia in other chronic diseases classified elsewhere: Secondary | ICD-10-CM | POA: Diagnosis not present

## 2017-04-08 DIAGNOSIS — Z7409 Other reduced mobility: Secondary | ICD-10-CM

## 2017-04-08 DIAGNOSIS — N189 Chronic kidney disease, unspecified: Secondary | ICD-10-CM

## 2017-04-08 DIAGNOSIS — M170 Bilateral primary osteoarthritis of knee: Secondary | ICD-10-CM

## 2017-04-08 DIAGNOSIS — G894 Chronic pain syndrome: Secondary | ICD-10-CM

## 2017-04-08 DIAGNOSIS — Z9181 History of falling: Secondary | ICD-10-CM

## 2017-04-08 DIAGNOSIS — J479 Bronchiectasis, uncomplicated: Secondary | ICD-10-CM | POA: Diagnosis not present

## 2017-04-08 LAB — LIPID PANEL
CHOL/HDL RATIO: 2.2 ratio (ref <5.0)
Cholesterol: 127 mg/dL (ref <200)
HDL: 57 mg/dL (ref >50)
LDL Cholesterol: 54 mg/dL (ref <100)
TRIGLYCERIDES: 78 mg/dL (ref <150)
VLDL: 16 mg/dL (ref <30)

## 2017-04-08 LAB — RENAL FUNCTION PANEL
ALBUMIN: 3.3 g/dL — AB (ref 3.6–5.1)
BUN: 30 mg/dL — ABNORMAL HIGH (ref 7–25)
CO2: 29 mmol/L (ref 20–31)
Calcium: 8.7 mg/dL (ref 8.6–10.4)
Chloride: 102 mmol/L (ref 98–110)
Creat: 1.63 mg/dL — ABNORMAL HIGH (ref 0.60–0.93)
Glucose, Bld: 92 mg/dL (ref 65–99)
Phosphorus: 3.3 mg/dL (ref 2.1–4.3)
Potassium: 4.7 mmol/L (ref 3.5–5.3)
SODIUM: 139 mmol/L (ref 135–146)

## 2017-04-08 LAB — CBC WITH DIFFERENTIAL/PLATELET
Basophils Absolute: 0 cells/uL (ref 0–200)
Basophils Relative: 0 %
EOS ABS: 55 {cells}/uL (ref 15–500)
Eosinophils Relative: 1 %
HEMATOCRIT: 33.5 % — AB (ref 35.0–45.0)
Hemoglobin: 10.9 g/dL — ABNORMAL LOW (ref 11.7–15.5)
LYMPHS PCT: 19 %
Lymphs Abs: 1045 cells/uL (ref 850–3900)
MCH: 30 pg (ref 27.0–33.0)
MCHC: 32.5 g/dL (ref 32.0–36.0)
MCV: 92.3 fL (ref 80.0–100.0)
MONO ABS: 770 {cells}/uL (ref 200–950)
MONOS PCT: 14 %
MPV: 11.2 fL (ref 7.5–12.5)
NEUTROS PCT: 66 %
Neutro Abs: 3630 cells/uL (ref 1500–7800)
Platelets: 154 10*3/uL (ref 140–400)
RBC: 3.63 MIL/uL — ABNORMAL LOW (ref 3.80–5.10)
RDW: 13.3 % (ref 11.0–15.0)
WBC: 5.5 10*3/uL (ref 4.0–10.5)

## 2017-04-08 LAB — HEPATIC FUNCTION PANEL
ALT: 8 U/L (ref 6–29)
AST: 14 U/L (ref 10–35)
Albumin: 3.3 g/dL — ABNORMAL LOW (ref 3.6–5.1)
Alkaline Phosphatase: 56 U/L (ref 33–130)
BILIRUBIN DIRECT: 0.1 mg/dL (ref <=0.2)
Indirect Bilirubin: 0.3 mg/dL (ref 0.2–1.2)
TOTAL PROTEIN: 5.8 g/dL — AB (ref 6.1–8.1)
Total Bilirubin: 0.4 mg/dL (ref 0.2–1.2)

## 2017-04-08 LAB — TSH: TSH: 1.11 m[IU]/L

## 2017-04-08 MED ORDER — HYDROCODONE-ACETAMINOPHEN 5-325 MG PO TABS: 1.0000 | ORAL_TABLET | Freq: Two times a day (BID) | ORAL | 0 refills | Status: DC

## 2017-04-08 NOTE — Progress Notes (Signed)
Subjective: Chief Complaint  Patient presents with  . med check    med check , discuss pain meds, pt fell last week   Here at my request from group home SNF for recheck. Lives at [facility] group home.   She is brought in today by [place] from the day program.   [name] provides some history but is not with her at the home so doesn't have all details about day to day care at the home.  Her medication log is reviewed today.    She is using scheduled Norco BID at 8am and 9pm for low back pain and bilat knee pain.   Last month had updated knee injection bilaterally by orthopedics for knee pain.  She gets low back pain intermittent, but her pains in general are worse in the evening.    appetite is fine.  Eats 3 meals daily and 2 snacks daily.  No blood in stool or urine.   Is social, participates in games with the other residents.  Listen to music, tv.  Nurse on site is [phone]  Of note, her medication log shows most of her evening medications are dispensed at 9pm, except simvastatin at 6pm.   There is a duplicate entry for norco for April and May, but no June notation?  She fell last week, unwitnessed.  Fell against her walker cutting her right forearm.   She notes some tenderness at the area of the wound.   In general hasn't been falling a lot.    No other c/o  Past Medical History:  Diagnosis Date  . Allergy   . Anemia of chronic disease 10/2010   stable as of 2015, on iron therapy for mild iron deficiency, chronic kidney disease, anemia of chronic disease; Dr. Ralene Ford prior consult  . Chronic kidney disease   . Constipation    mild intermittent  . Depression    [facility] - NP [name]  . Diabetes mellitus   . Diabetes type 2, controlled ([place])   . Edentulous   . Falls   . GERD (gastroesophageal reflux disease)   . H/O cardiovascular stress test 06/2014   nuclear stress test normal; Dr. Wyatt Ford  . H/O echocardiogram 06/17/2014   mild LVH,  EF 60-65%, no wall motion abnormalities // Echo 11/17: EF 55-60, normal wall motion, grade 2 diastolic dysfunction, MAC  . H/O mammogram 08/04/2014   normal  . History of MRI of brain and brain stem 11/2010   normal MRI of brain  . Hx of deep venous thrombosis    lifelong anticoagulant therapy  . Hyperlipidemia   . Hypertension   . Hypertrophic obstructive cardiomyopathy (HOCM) ([place]) 2015   normal echo and stress test other than mild LVH 06/2014; Dr. Dorris Ford  . Long term current use of anticoagulant therapy    due to hx/o recurrent DVT  . Mild mental retardation   . Moderate persistent asthma    asthma and bronchiectasis, pulm consult 2015; unable to do PFTs, 2015  . Mood disorder ([place])    [facility] - NP [name]  . Osteoarthritis of both knees    Dr. Frederik Ford  . Osteopenia 2013   improvements on Boniva and Ca+D from 2011-2013.  2013 Bone Density normal /improved; repeat Bone Density scan 2016  . PVD (peripheral vascular disease) ([place])   . Shortness of breath    06/2014 cardiac consult, SOB seems to be related to poor technique with handheld inhaler,  switched to nebs with much improvement  . Thrombocytopenia ([name]) 10/2010   due to medications and immune dysregulation likely, stable as of 2015; no further investigation; hematology, Dr. Ralene Ford  . Tremor 2015   consult with Dr. Netta Ford Neurology.  Parkinsonian tremor without Parkinsons   Current Outpatient Prescriptions on File Prior to Visit  Medication Sig Dispense Refill  . albuterol (PROVENTIL) (2.5 MG/3ML) 0.083% nebulizer solution Take 3 mLs (2.5 mg total) by nebulization every 6 (six) hours as needed for wheezing or shortness of breath (dx J47.9    I42.2). 360 mL 2  . budesonide (PULMICORT) 0.5 MG/2ML nebulizer solution TAKE 2 ml (0.50m) BY NEBULIZER TWICE DAILY 120 mL 11  . Calcium Carbonate-Vit D-Min (CALCIUM 1200) 1200-1000 MG-UNIT CHEW Chew 1,000 mg by mouth once. 90 each 3  . divalproex  (DEPAKOTE) 250 MG DR tablet Take 2 tablets (500 mg total) by mouth 2 (two) times daily. 60 tablet 3  . ferrous sulfate 325 (65 FE) MG tablet Take 1 tablet (325 mg total) by mouth daily with breakfast. 90 tablet 3  . FLUoxetine (PROZAC) 40 MG capsule Take 40 mg by mouth daily.      . fluticasone (FLONASE) 50 MCG/ACT nasal spray Place 2 sprays into both nostrils daily. 16 g 11  . furosemide (LASIX) 20 MG tablet Take 1 tablet (20 mg total) by mouth daily. 90 tablet 3  . glucose blood (ACCU-CHEK AVIVA PLUS) test strip CHECK BLOOD GLUCOSE (SUGAR) 1 OR 2 TIMESA WEEK 100 each 2  . isosorbide mononitrate (IMDUR) 30 MG 24 hr tablet Take 1 tablet (30 mg total) by mouth daily. 90 tablet 3  . loratadine (QC LORATADINE ALLERGY RELIEF) 10 MG tablet Take 1 tablet (10 mg total) by mouth daily. 90 tablet 3  . LORazepam (ATIVAN) 0.5 MG tablet Take 0.5 mg by mouth 2 (two) times daily. And as needed    . metoCLOPramide (REGLAN) 5 MG tablet Take 1 tablet (5 mg total) by mouth daily. 90 tablet 3  . metoprolol tartrate (LOPRESSOR) 25 MG tablet Take 0.5 tablets (12.5 mg total) by mouth 2 (two) times daily. 180 tablet 3  . montelukast (SINGULAIR) 10 MG tablet Take 1 tablet (10 mg total) by mouth at bedtime. 90 tablet 3  . Multiple Vitamin (MULTIVITAMIN) tablet Take 1 tablet by mouth daily. 90 tablet 3  . omeprazole (PRILOSEC) 20 MG capsule Take 1 capsule (20 mg total) by mouth daily. 90 capsule 3  . potassium chloride (K-DUR) 10 MEQ tablet Take 1 tablet (10 mEq total) by mouth daily. 90 tablet 3  . PRODIGY TWIST TOP LANCETS 28G MISC TWICE weekly 100 each 2  . PROLIA 60 MG/ML SOLN injection 1 mL by Subconjunctival route every 6 (six) months.    . rivaroxaban (XARELTO) 20 MG TABS tablet Take 1 tablet (20 mg total) by mouth daily with supper. 90 tablet 3  . silver sulfADIAZINE (SILVADENE) 1 % cream Apply 1 application topically daily. 50 g 1  . simvastatin (ZOCOR) 20 MG tablet Take 1 tablet (20 mg total) by mouth daily at 6  PM. 90 tablet 0  . docusate sodium (COLACE) 100 MG capsule Take 1 capsule (100 mg total) by mouth 2 (two) times daily. Prn 60 capsule 11  . FLUoxetine (PROZAC) 10 MG capsule Take 10 mg by mouth daily. 149mplus 40 mg am     No current facility-administered medications on file prior to visit.    Review of Systems Constitutional: -fever, -chills, -sweats, -unexpected weight change,-fatigue  ENT: -runny nose, -ear pain, -sore throat Cardiology:  -chest pain, -palpitations, -edema Respiratory: -cough, -shortness of breath, -wheezing Gastroenterology: -abdominal pain, -nausea, -vomiting, -diarrhea, -constipation  Ophthalmology: -vision changes Urology: -dysuria, -difficulty urinating, -hematuria, -urinary frequency, -urgency Neurology: -headache, -weakness, -tingling, -numbness    Objective: BP 116/74   Pulse 88   Wt 146 lb 9.6 oz (66.5 kg)   SpO2 96%   BMI 20.16 kg/m   Wt Readings from Last 3 Encounters:  04/08/17 146 lb 9.6 oz (66.5 kg)  12/02/16 160 lb 3.2 oz (72.7 kg)  09/04/16 174 lb 3.2 oz (79 kg)   General appearance: alert, no distress, WD/WN,  Oral cavity: MMM, no lesions Neck: supple, no lymphadenopathy, no thyromegaly, no masses Heart: RRR, normal S1, S2, no murmurs Lungs: CTA bilaterally, no wheezes, rhonchi, or rales Abdomen: +bs, soft, non tender, non distended, no masses, no hepatomegaly, no splenomegaly Pulses: 2+ symmetric, upper and lower extremities, normal cap refill non tender low back.  ROM relatively full MSK: mild tendnerss over right mid and distal forearm, mild pain with right wrist extension.  othewise arms and legs nontender Skin : there is a roundish series of three curved 1/4 circle abrasions of right dorsal forearm, with scabbing present,  There are several purplish bruises and purpura coloration along right forearm, some old suggestive of senile purpura, and some suggestive of the recent fall and injury.    Diabetic Foot Exam - Simple   Simple  Foot Form Diabetic Foot exam was performed with the following findings:  Yes 04/08/2017  9:28 PM  Visual Inspection See comments:  Yes Sensation Testing Intact to touch and monofilament testing bilaterally:  Yes Pulse Check Posterior Tibialis and Dorsalis pulse intact bilaterally:  Yes Comments Mildly thickened toenails, right great toe medial surface of MTP with mild callous      Assessment: Encounter Diagnoses  Name Primary?  . Weight loss Yes  . Essential hypertension   . Senile purpura ([place])   . Moderate persistent asthma without complication   . Bronchiectasis without complication ([place])   . Allergic rhinitis due to pollen, unspecified seasonality   . Peripheral vascular disease ([place])   . Type 2 diabetes mellitus with complication, without long-term current use of insulin ([place])   . Abnormal involuntary movement   . History of DVT (deep vein thrombosis)   . High risk medication use   . Frequent falls   . Chronic pain syndrome   . Anemia of chronic disease   . Anorexia   . Chronic kidney disease, unspecified CKD stage   . Primary osteoarthritis of both knees   . Mild mental retardation   . Long term current use of anticoagulant therapy   . Impaired mobility   . Chronic bilateral low back pain without sciatica   . Abrasion of right upper extremity, initial encounter   . History of recent fall   . Arm contusion, right, initial encounter       Plan: Of note, she has had recent 14lb weight loss.  Will need to monitor weight a little more closely with help of group home.  Reportedly appetite is good, not skipping meals, seems to be in good spirits.  Will send for xray of L spine and right forearm given the pain reports and recent fall.   Chronic pain, knee pain, back pain - c/t Norco, but will communicate with group home to give evening dose earlier such as 7 or 8pm instead of 9pm.  C/t 8am dose  Labs today given the chronic issues  Asthma is controlled on current  medications  Bandaged her right arm wounds with bacitracin and sterile bandage today.  Discussed wound care  Reviewed med history today, f/u pending labs  [name] was seen today for med check.  Diagnoses and all orders for this visit:  Weight loss -     CBC with Differential/Platelet -     TSH -     Hepatic function panel -     Renal Function Panel  Essential hypertension -     TSH -     Lipid panel  Senile purpura (HCC)  Moderate persistent asthma without complication  Bronchiectasis without complication (H[facility])  Allergic rhinitis due to pollen, unspecified seasonality  Peripheral vascular disease (H[facility])  Type 2 diabetes mellitus with complication, without long-term current use of insulin (H[facility]) -     CBC with Differential/Platelet -     Hemoglobin A1c -     TSH -     HM DIABETES FOOT EXAM -     HM DIABETES EYE EXAM -     Microalbumin / creatinine urine ratio -     Hepatic function panel -     Renal Function Panel  Abnormal involuntary movement  History of DVT (deep vein thrombosis)  High risk medication use  Frequent falls  Chronic pain syndrome -     Discontinue: HYDROcodone-acetaminophen (NORCO) 5-325 MG tablet; Take 1 tablet by mouth 2 (two) times daily. -     Discontinue: HYDROcodone-acetaminophen (NORCO) 5-325 MG tablet; Take 1 tablet by mouth 2 (two) times daily. -     HYDROcodone-acetaminophen (NORCO) 5-325 MG tablet; Take 1 tablet by mouth 2 (two) times daily.  Anemia of chronic disease -     CBC with Differential/Platelet  Anorexia  Chronic kidney disease, unspecified CKD stage  Primary osteoarthritis of both knees  Mild mental retardation  Long term current use of anticoagulant therapy  Impaired mobility  Chronic bilateral low back pain without sciatica -     DG Lumbar Spine Complete; Future -     DG Lumbar Spine Complete; Future  Abrasion of right upper extremity, initial encounter -     DG Forearm Right; Future -     DG Forearm Right;  Future  History of recent fall -     DG Forearm Right; Future -     DG Forearm Right; Future  Arm contusion, right, initial encounter -     DG Forearm Right; Future -     DG Forearm Right; Future

## 2017-04-09 LAB — HEMOGLOBIN A1C
Hgb A1c MFr Bld: 5.8 % — ABNORMAL HIGH (ref <5.7)
Mean Plasma Glucose: 120 mg/dL

## 2017-04-09 LAB — MICROALBUMIN / CREATININE URINE RATIO
Creatinine, Urine: 171 mg/dL (ref 20–320)
MICROALB/CREAT RATIO: 11 ug/mg{creat} (ref <30)
Microalb, Ur: 1.9 mg/dL

## 2017-04-14 ENCOUNTER — Encounter: Payer: Self-pay | Admitting: Medical

## 2017-04-18 ENCOUNTER — Telehealth: Payer: Self-pay

## 2017-04-18 NOTE — Telephone Encounter (Signed)
Called to let us know [name] has not been feeling good no fever 99.8 all vitals normal just she had a rattle in left lung she told them to give her a neb treatment to see if it would lossen it up she wanted to know if there is anything else she needs to do please advise

## 2017-04-18 NOTE — Telephone Encounter (Signed)
[name] was informed no need for anything else unless she got worse then make appointment

## 2017-04-18 NOTE — Telephone Encounter (Signed)
No

## 2017-05-08 DIAGNOSIS — H26493 Other secondary cataract, bilateral: Secondary | ICD-10-CM | POA: Diagnosis not present

## 2017-05-08 DIAGNOSIS — E119 Type 2 diabetes mellitus without complications: Secondary | ICD-10-CM | POA: Diagnosis not present

## 2017-05-09 ENCOUNTER — Encounter: Payer: Self-pay | Admitting: Medical

## 2017-05-09 ENCOUNTER — Ambulatory Visit
Admission: RE | Admit: 2017-05-09 | Discharge: 2017-05-09 | Disposition: A | Discharge status: Home / Self Care | Payer: Medicare Other | Source: Ambulatory Visit | Attending: Medical | Admitting: Medical

## 2017-05-09 ENCOUNTER — Ambulatory Visit (INDEPENDENT_AMBULATORY_CARE_PROVIDER_SITE_OTHER): Payer: Medicare Other | Admitting: Medical

## 2017-05-09 ENCOUNTER — Telehealth: Payer: Self-pay | Admitting: Medical

## 2017-05-09 VITALS — BP 118/82 | HR 84 | Temp 98.1°F | Wt 138.6 lb

## 2017-05-09 DIAGNOSIS — R0602 Shortness of breath: Secondary | ICD-10-CM

## 2017-05-09 DIAGNOSIS — R05 Cough: Secondary | ICD-10-CM | POA: Diagnosis not present

## 2017-05-09 DIAGNOSIS — R059 Cough, unspecified: Secondary | ICD-10-CM

## 2017-05-09 DIAGNOSIS — R634 Abnormal weight loss: Secondary | ICD-10-CM | POA: Diagnosis not present

## 2017-05-09 LAB — TSH: TSH: 1.13 mIU/L

## 2017-05-09 LAB — BASIC METABOLIC PANEL
BUN: 34 mg/dL — AB (ref 7–25)
CO2: 30 mmol/L (ref 20–31)
Calcium: 8.9 mg/dL (ref 8.6–10.4)
Chloride: 102 mmol/L (ref 98–110)
Creat: 1.79 mg/dL — ABNORMAL HIGH (ref 0.60–0.93)
GLUCOSE: 152 mg/dL — AB (ref 65–99)
Potassium: 4.9 mmol/L (ref 3.5–5.3)
SODIUM: 140 mmol/L (ref 135–146)

## 2017-05-09 LAB — CBC WITH DIFFERENTIAL/PLATELET
Basophils Absolute: 0 cells/uL (ref 0–200)
Basophils Relative: 0 %
EOS PCT: 1 %
Eosinophils Absolute: 84 cells/uL (ref 15–500)
HCT: 33.8 % — ABNORMAL LOW (ref 35.0–45.0)
Hemoglobin: 11.2 g/dL — ABNORMAL LOW (ref 11.7–15.5)
Lymphocytes Relative: 17 %
Lymphs Abs: 1428 cells/uL (ref 850–3900)
MCH: 31.2 pg (ref 27.0–33.0)
MCHC: 33.1 g/dL (ref 32.0–36.0)
MCV: 94.2 fL (ref 80.0–100.0)
MPV: 10.4 fL (ref 7.5–12.5)
Monocytes Absolute: 840 cells/uL (ref 200–950)
Monocytes Relative: 10 %
NEUTROS PCT: 72 %
Neutro Abs: 6048 cells/uL (ref 1500–7800)
PLATELETS: 175 10*3/uL (ref 140–400)
RBC: 3.59 MIL/uL — AB (ref 3.80–5.10)
RDW: 13.2 % (ref 11.0–15.0)
WBC: 8.4 10*3/uL (ref 4.0–10.5)

## 2017-05-09 MED ORDER — CLARITHROMYCIN 500 MG PO TABS: 500.0000 mg | ORAL_TABLET | Freq: Two times a day (BID) | ORAL | 0 refills | Status: DC

## 2017-05-09 MED ORDER — PREDNISONE 10 MG PO TABS: ORAL_TABLET | ORAL | 0 refills | Status: DC

## 2017-05-09 NOTE — Progress Notes (Signed)
Subjective: Chief Complaint  Patient presents with  . Cough    lingering  cough   Here for lingering cough x 2 weeks.   Seems like she is trying to get mucous up.   No fever.   Has sounded wheezy.   Doesn't feel well.  Stomach hurts some.  No other symptoms.  Still using Pulmicort neb BID, albuterol BID last few days or so. No other aggravating or relieving factors. No other complaint.  Past Medical History:  Diagnosis Date  . Allergy   . Anemia of chronic disease 10/2010   stable as of 2015, on iron therapy for mild iron deficiency, chronic kidney disease, anemia of chronic disease; Dr. Ralene Ok prior consult  . Chronic kidney disease   . Constipation    mild intermittent  . Depression    [facility] - NP Eugenie Norrie  . Diabetes mellitus   . Diabetes type 2, controlled ([place])   . Edentulous   . Falls   . GERD (gastroesophageal reflux disease)   . H/O cardiovascular stress test 06/2014   nuclear stress test normal; Dr. Wyatt Haste  . H/O echocardiogram 06/17/2014   mild LVH, EF 60-65%, no wall motion abnormalities // Echo 11/17: EF 55-60, normal wall motion, grade 2 diastolic dysfunction, MAC  . H/O mammogram 08/04/2014   normal  . History of MRI of brain and brain stem 11/2010   normal MRI of brain  . Hx of deep venous thrombosis    lifelong anticoagulant therapy  . Hyperlipidemia   . Hypertension   . Hypertrophic obstructive cardiomyopathy (HOCM) ([place]) 2015   normal echo and stress test other than mild LVH 06/2014; Dr. Dorris Carnes  . Long term current use of anticoagulant therapy    due to hx/o recurrent DVT  . Mild mental retardation   . Moderate persistent asthma    asthma and bronchiectasis, pulm consult 2015; unable to do PFTs, 2015  . Mood disorder ([place])    [facility] - NP Eugenie Norrie  . Osteoarthritis of both knees    Dr. Frederik Pear  . Osteopenia 2013   improvements on Boniva and Ca+D from 2011-2013.  2013 Bone Density normal /improved;  repeat Bone Density scan 2016  . PVD (peripheral vascular disease) ([place])   . Shortness of breath    06/2014 cardiac consult, SOB seems to be related to poor technique with handheld inhaler, switched to nebs with much improvement  . Thrombocytopenia ([place]) 10/2010   due to medications and immune dysregulation likely, stable as of 2015; no further investigation; hematology, Dr. Ralene Ok  . Tremor 2015   consult with Dr. Netta Neat Neurology.  Parkinsonian tremor without Parkinsons    Current Outpatient Prescriptions on File Prior to Visit  Medication Sig Dispense Refill  . albuterol (PROVENTIL) (2.5 MG/3ML) 0.083% nebulizer solution Take 3 mLs (2.5 mg total) by nebulization every 6 (six) hours as needed for wheezing or shortness of breath (dx J47.9    I42.2). 360 mL 2  . budesonide (PULMICORT) 0.5 MG/2ML nebulizer solution TAKE 2 ml (0.12m) BY NEBULIZER TWICE DAILY 120 mL 11  . Calcium Carbonate-Vit D-Min (CALCIUM 1200) 1200-1000 MG-UNIT CHEW Chew 1,000 mg by mouth once. 90 each 3  . divalproex (DEPAKOTE) 250 MG DR tablet Take 2 tablets (500 mg total) by mouth 2 (two) times daily. 60 tablet 3  . docusate sodium (COLACE) 100 MG capsule Take 1 capsule (100 mg total) by mouth 2 (two) times daily. Prn 60 capsule  11  . ferrous sulfate 325 (65 FE) MG tablet Take 1 tablet (325 mg total) by mouth daily with breakfast. 90 tablet 3  . FLUoxetine (PROZAC) 10 MG capsule Take 10 mg by mouth daily. 35m plus 40 mg am    . FLUoxetine (PROZAC) 40 MG capsule Take 40 mg by mouth daily.      . fluticasone (FLONASE) 50 MCG/ACT nasal spray Place 2 sprays into both nostrils daily. 16 g 11  . furosemide (LASIX) 20 MG tablet Take 1 tablet (20 mg total) by mouth daily. 90 tablet 3  . glucose blood (ACCU-CHEK AVIVA PLUS) test strip CHECK BLOOD GLUCOSE (SUGAR) 1 OR 2 TIMESA WEEK 100 each 2  . [START ON 06/08/2017] HYDROcodone-acetaminophen (NORCO) 5-325 MG tablet Take 1 tablet by mouth 2 (two) times daily. 60 tablet 0   . isosorbide mononitrate (IMDUR) 30 MG 24 hr tablet Take 1 tablet (30 mg total) by mouth daily. 90 tablet 3  . loratadine (QC LORATADINE ALLERGY RELIEF) 10 MG tablet Take 1 tablet (10 mg total) by mouth daily. 90 tablet 3  . LORazepam (ATIVAN) 0.5 MG tablet Take 0.5 mg by mouth 2 (two) times daily. And as needed    . metoCLOPramide (REGLAN) 5 MG tablet Take 1 tablet (5 mg total) by mouth daily. 90 tablet 3  . metoprolol tartrate (LOPRESSOR) 25 MG tablet Take 0.5 tablets (12.5 mg total) by mouth 2 (two) times daily. 180 tablet 3  . montelukast (SINGULAIR) 10 MG tablet Take 1 tablet (10 mg total) by mouth at bedtime. 90 tablet 3  . Multiple Vitamin (MULTIVITAMIN) tablet Take 1 tablet by mouth daily. 90 tablet 3  . omeprazole (PRILOSEC) 20 MG capsule Take 1 capsule (20 mg total) by mouth daily. 90 capsule 3  . potassium chloride (K-DUR) 10 MEQ tablet Take 1 tablet (10 mEq total) by mouth daily. 90 tablet 3  . PRODIGY TWIST TOP LANCETS 28G MISC TWICE weekly 100 each 2  . PROLIA 60 MG/ML SOLN injection 1 mL by Subconjunctival route every 6 (six) months.    . rivaroxaban (XARELTO) 20 MG TABS tablet Take 1 tablet (20 mg total) by mouth daily with supper. 90 tablet 3  . silver sulfADIAZINE (SILVADENE) 1 % cream Apply 1 application topically daily. 50 g 1  . simvastatin (ZOCOR) 20 MG tablet Take 1 tablet (20 mg total) by mouth daily at 6 PM. 90 tablet 0   No current facility-administered medications on file prior to visit.    ROS as in subjective   Objective: BP 118/82   Pulse 84   Temp 98.1 F (36.7 C)   Wt 138 lb 9.6 oz (62.9 kg)   SpO2 94%   BMI 19.06 kg/m   Wt Readings from Last 3 Encounters:  05/09/17 138 lb 9.6 oz (62.9 kg)  04/08/17 146 lb 9.6 oz (66.5 kg)  12/02/16 160 lb 3.2 oz (72.7 kg)   General appearance: alert, no distress, WD/WN HEENT: normocephalic, sclerae anicteric, TMs pearly, nares patent, no discharge or erythema, pharynx normal Oral cavity: MMM, no lesions Neck:  supple, no lymphadenopathy, no thyromegaly, no masses Heart: RRR, normal S1, S2, no murmurs Lungs: decreased left lower fields, otherwise no wheezes, rhonchi, or rales Abdomen: +bs, soft, non tender, non distended, no masses, no hepatomegaly, no splenomegaly Pulses: 1+ symmetric, upper and lower extremities, normal cap refill     Assessment: Encounter Diagnoses  Name Primary?  . Cough Yes  . SOB (shortness of breath)   . Weight loss  Plan: Concern for possible pneumonia.   Advised rest, hydration, STAT labs today, and go for xray.    Medications sent presumptively for CAP.   If much worse over weekend, go to the ED  Annaly was seen today for cough.  Diagnoses and all orders for this visit:  Cough -     DG Chest 2 View; Future -     Basic metabolic panel -     CBC with Differential/Platelet -     TSH -     Pulse oximetry (single); Future  SOB (shortness of breath) -     DG Chest 2 View; Future -     Basic metabolic panel -     CBC with Differential/Platelet -     TSH -     Pulse oximetry (single); Future  Weight loss -     DG Chest 2 View; Future -     Basic metabolic panel -     CBC with Differential/Platelet -     TSH -     Pulse oximetry (single); Future  Other orders -     clarithromycin (BIAXIN) 500 MG tablet; Take 1 tablet (500 mg total) by mouth 2 (two) times daily. -     predniSONE (DELTASONE) 10 MG tablet; 6/5/4/3/2/1

## 2017-05-09 NOTE — Telephone Encounter (Signed)
I put blank script on your desk for the written order - Biaxin and Prednisone

## 2017-05-09 NOTE — Telephone Encounter (Signed)
Pt order was sent to home and spoke with [facility] about this.

## 2017-05-09 NOTE — Telephone Encounter (Signed)
[name] at [facility] called to let [name] know that if any meds are ordered for [name] after xray today, script will need to be sent to their emergency pharmacy at [facility] and [name] will need an order faxed to her at [phone] for the meds before she able to give new meds to [name].

## 2017-05-16 ENCOUNTER — Telehealth: Payer: Self-pay

## 2017-05-16 NOTE — Telephone Encounter (Signed)
The nurse jackie called from the group  Said that [name] is feeling must better and no fever, still coughing a little but not like before, sound better, more active. She said that she is prednisone, did you still need for her to come in ?

## 2017-05-18 NOTE — Telephone Encounter (Signed)
If improving, I would recommend f/u in 2wk to make sure lungs still clear and not losing anymore weight

## 2017-05-19 NOTE — Telephone Encounter (Signed)
Havery Moros, reached  Voice mail. Left info for follow up appt.

## 2017-05-21 ENCOUNTER — Telehealth: Payer: Self-pay | Admitting: Medical

## 2017-05-21 NOTE — Telephone Encounter (Signed)
pls call and see how Para March is doing?  I received incident report that either 1 or more medications were missed on 05/16/17.   There is a form that appears to require my signautre?  Please inquire

## 2017-05-21 NOTE — Telephone Encounter (Signed)
Spoke with pt 's nurse  [name] at [facility] ,she said ms.Chermak is fine and having no problems.

## 2017-05-27 ENCOUNTER — Ambulatory Visit (INDEPENDENT_AMBULATORY_CARE_PROVIDER_SITE_OTHER): Payer: Medicare Other | Admitting: Podiatry

## 2017-05-27 ENCOUNTER — Encounter: Payer: Self-pay | Admitting: Podiatry

## 2017-05-27 DIAGNOSIS — M79676 Pain in unspecified toe(s): Secondary | ICD-10-CM

## 2017-05-27 DIAGNOSIS — E119 Type 2 diabetes mellitus without complications: Secondary | ICD-10-CM

## 2017-05-27 DIAGNOSIS — B351 Tinea unguium: Secondary | ICD-10-CM

## 2017-05-27 NOTE — Progress Notes (Signed)
Patient ID: [name], female   DOB: Sep 09, 1935, 82 y.o.   MRN: [id number]    Subjective: This patient presents for ongoing debridement of painful toenails. Patient is a diabetic with a history of mood disorders, abnormal involuntary movements. Patient lives in assisted living and patient's caregivers present to treatment room  Objective: Patient is confused and has difficulty responding to direct questioning DP and PT pulses 2/4 bilaterally Capillary reflex delayed bilaterally Sensation to 10 g monofilament wire difficult to assess patient has difficulty responding Sensation to tuning fork difficulty to assess patient has difficulty responding Ankle reflexes reactive bilaterally No open skin lesions bilaterally Atrophic skin bilateral The toenails 6-10 are elongated, brittle, discolored, hypertrophic and tender to direct palpation HAV bilaterally Hammertoe 2-Bilaterally Manual motor testing dorsi flexion, plantar flexion 5/5 bilaterally Patient walks slowly with walker Has involuntary upper and lower extremity motor movements  Assessment: Diabetic with satisfactory vascular status Neurological status hard to evaluate Symptomatic onychomycoses 6-10  Plan: Debridement toenails 6-10 mechanically and electrically without any bleeding  Reappoint 3 months

## 2017-05-27 NOTE — Patient Instructions (Signed)

## 2017-07-01 ENCOUNTER — Ambulatory Visit (INDEPENDENT_AMBULATORY_CARE_PROVIDER_SITE_OTHER): Payer: Medicare Other | Admitting: Medical

## 2017-07-01 ENCOUNTER — Encounter: Payer: Self-pay | Admitting: Medical

## 2017-07-01 VITALS — BP 136/74 | HR 78 | Wt 134.0 lb

## 2017-07-01 DIAGNOSIS — E118 Type 2 diabetes mellitus with unspecified complications: Secondary | ICD-10-CM

## 2017-07-01 DIAGNOSIS — R8290 Unspecified abnormal findings in urine: Secondary | ICD-10-CM

## 2017-07-01 DIAGNOSIS — N189 Chronic kidney disease, unspecified: Secondary | ICD-10-CM

## 2017-07-01 DIAGNOSIS — R630 Anorexia: Secondary | ICD-10-CM

## 2017-07-01 DIAGNOSIS — D638 Anemia in other chronic diseases classified elsewhere: Secondary | ICD-10-CM

## 2017-07-01 DIAGNOSIS — Z7409 Other reduced mobility: Secondary | ICD-10-CM | POA: Diagnosis not present

## 2017-07-01 DIAGNOSIS — M549 Dorsalgia, unspecified: Secondary | ICD-10-CM | POA: Diagnosis not present

## 2017-07-01 DIAGNOSIS — W19XXXA Unspecified fall, initial encounter: Secondary | ICD-10-CM

## 2017-07-01 DIAGNOSIS — M25532 Pain in left wrist: Secondary | ICD-10-CM

## 2017-07-01 DIAGNOSIS — G894 Chronic pain syndrome: Secondary | ICD-10-CM

## 2017-07-01 DIAGNOSIS — M79641 Pain in right hand: Secondary | ICD-10-CM | POA: Diagnosis not present

## 2017-07-01 DIAGNOSIS — M79632 Pain in left forearm: Secondary | ICD-10-CM | POA: Diagnosis not present

## 2017-07-01 DIAGNOSIS — R634 Abnormal weight loss: Secondary | ICD-10-CM

## 2017-07-01 LAB — CBC WITH DIFFERENTIAL/PLATELET
BASOS ABS: 0 {cells}/uL (ref 0–200)
Basophils Relative: 0 %
EOS ABS: 258 {cells}/uL (ref 15–500)
Eosinophils Relative: 3 %
HCT: 33.2 % — ABNORMAL LOW (ref 35.0–45.0)
Hemoglobin: 10.9 g/dL — ABNORMAL LOW (ref 11.7–15.5)
LYMPHS PCT: 13 %
Lymphs Abs: 1118 cells/uL (ref 850–3900)
MCH: 30.8 pg (ref 27.0–33.0)
MCHC: 32.8 g/dL (ref 32.0–36.0)
MCV: 93.8 fL (ref 80.0–100.0)
MPV: 10.3 fL (ref 7.5–12.5)
Monocytes Absolute: 860 cells/uL (ref 200–950)
Monocytes Relative: 10 %
NEUTROS PCT: 74 %
Neutro Abs: 6364 cells/uL (ref 1500–7800)
PLATELETS: 248 10*3/uL (ref 140–400)
RBC: 3.54 MIL/uL — ABNORMAL LOW (ref 3.80–5.10)
RDW: 12.6 % (ref 11.0–15.0)
WBC: 8.6 10*3/uL (ref 4.0–10.5)

## 2017-07-01 LAB — POCT URINALYSIS DIP (PROADVANTAGE DEVICE)
BILIRUBIN UA: NEGATIVE
BILIRUBIN UA: NEGATIVE mg/dL
Blood, UA: NEGATIVE
Glucose, UA: NEGATIVE mg/dL
Nitrite, UA: NEGATIVE
SPECIFIC GRAVITY, URINE: 1.015
Urobilinogen, Ur: NEGATIVE
pH, UA: 7 (ref 5.0–8.0)

## 2017-07-01 MED ORDER — HYDROCODONE-ACETAMINOPHEN 5-325 MG PO TABS: 1.0000 | ORAL_TABLET | Freq: Four times a day (QID) | ORAL | 0 refills | Status: DC | PRN

## 2017-07-01 NOTE — Progress Notes (Signed)
Subjective: Chief Complaint  Patient presents with  . back pain    back pain , she said that she fell    Here for fall, back pain.   Here today from health services coordinator from [facility], [employer].  However, she fell last week at the group home.   Alexis called while I was in to get incident report sent to [place].   Here for c/o left low back pain.  She notes she was in the kitchen at the group home Saturday 06/28/17, using walker, and somehow fell walking back to her room.  She said she just fell, hit forehead, tore skin from top of right hand and left forearm.  However, group home said she fell backwards.    She currently reports pain in both hands, arms, low back.  She denies pain in head, no leg pain. No paresthesias.   When examined she does seem to note pain throughout arms, although there are some distinct tender points.     She denies syncope.    She is not sure exactly how she fell.  She does have mental retardation so history is limited today.  Alexis from day program was advised to bring her to appt today, was notified of appt over weekend.  Ubaldo Glassing does not work for the group home.   Ms. Jeanett Schlein notes lack of desire for food since the fall.  Past Medical History:  Diagnosis Date  . Allergy   . Anemia of chronic disease 10/2010   stable as of 2015, on iron therapy for mild iron deficiency, chronic kidney disease, anemia of chronic disease; Dr. Ralene Ok prior consult  . Chronic kidney disease   . Constipation    mild intermittent  . Depression    [facility] - NP Eugenie Norrie  . Diabetes mellitus   . Diabetes type 2, controlled ([place])   . Edentulous   . Falls   . GERD (gastroesophageal reflux disease)   . H/O cardiovascular stress test 06/2014   nuclear stress test normal; Dr. Wyatt Haste  . H/O echocardiogram 06/17/2014   mild LVH, EF 60-65%, no wall motion abnormalities // Echo 11/17: EF 55-60, normal wall motion, grade 2 diastolic dysfunction, MAC   . H/O mammogram 08/04/2014   normal  . History of MRI of brain and brain stem 11/2010   normal MRI of brain  . Hx of deep venous thrombosis    lifelong anticoagulant therapy  . Hyperlipidemia   . Hypertension   . Hypertrophic obstructive cardiomyopathy (HOCM) ([place]) 2015   normal echo and stress test other than mild LVH 06/2014; Dr. Dorris Carnes  . Long term current use of anticoagulant therapy    due to hx/o recurrent DVT  . Mild mental retardation   . Moderate persistent asthma    asthma and bronchiectasis, pulm consult 2015; unable to do PFTs, 2015  . Mood disorder ([place])    [facility] - NP Eugenie Norrie  . Osteoarthritis of both knees    Dr. Frederik Pear  . Osteopenia 2013   improvements on Boniva and Ca+D from 2011-2013.  2013 Bone Density normal /improved; repeat Bone Density scan 2016  . PVD (peripheral vascular disease) ([place])   . Shortness of breath    06/2014 cardiac consult, SOB seems to be related to poor technique with handheld inhaler, switched to nebs with much improvement  . Thrombocytopenia ([place]) 10/2010   due to medications and immune dysregulation likely, stable as of 2015;  no further investigation; hematology, Dr. Ralene Ok  . Tremor 2015   consult with Dr. Netta Neat Neurology.  Parkinsonian tremor without Parkinsons   Current Outpatient Prescriptions on File Prior to Visit  Medication Sig Dispense Refill  . albuterol (PROVENTIL) (2.5 MG/3ML) 0.083% nebulizer solution Take 3 mLs (2.5 mg total) by nebulization every 6 (six) hours as needed for wheezing or shortness of breath (dx J47.9    I42.2). 360 mL 2  . budesonide (PULMICORT) 0.5 MG/2ML nebulizer solution TAKE 2 ml (0.96m) BY NEBULIZER TWICE DAILY 120 mL 11  . Calcium Carbonate-Vit D-Min (CALCIUM 1200) 1200-1000 MG-UNIT CHEW Chew 1,000 mg by mouth once. 90 each 3  . clarithromycin (BIAXIN) 500 MG tablet Take 1 tablet (500 mg total) by mouth 2 (two) times daily. 20 tablet 0  . divalproex (DEPAKOTE)  250 MG DR tablet Take 2 tablets (500 mg total) by mouth 2 (two) times daily. 60 tablet 3  . docusate sodium (COLACE) 100 MG capsule Take 1 capsule (100 mg total) by mouth 2 (two) times daily. Prn 60 capsule 11  . ferrous sulfate 325 (65 FE) MG tablet Take 1 tablet (325 mg total) by mouth daily with breakfast. 90 tablet 3  . FLUoxetine (PROZAC) 10 MG capsule Take 10 mg by mouth daily. 176mplus 40 mg am    . FLUoxetine (PROZAC) 40 MG capsule Take 40 mg by mouth daily.      . fluticasone (FLONASE) 50 MCG/ACT nasal spray Place 2 sprays into both nostrils daily. 16 g 11  . furosemide (LASIX) 20 MG tablet Take 1 tablet (20 mg total) by mouth daily. 90 tablet 3  . glucose blood (ACCU-CHEK AVIVA PLUS) test strip CHECK BLOOD GLUCOSE (SUGAR) 1 OR 2 TIMESA WEEK 100 each 2  . isosorbide mononitrate (IMDUR) 30 MG 24 hr tablet Take 1 tablet (30 mg total) by mouth daily. 90 tablet 3  . loratadine (QC LORATADINE ALLERGY RELIEF) 10 MG tablet Take 1 tablet (10 mg total) by mouth daily. 90 tablet 3  . LORazepam (ATIVAN) 0.5 MG tablet Take 0.5 mg by mouth 2 (two) times daily. And as needed    . metoCLOPramide (REGLAN) 5 MG tablet Take 1 tablet (5 mg total) by mouth daily. 90 tablet 3  . metoprolol tartrate (LOPRESSOR) 25 MG tablet Take 0.5 tablets (12.5 mg total) by mouth 2 (two) times daily. 180 tablet 3  . montelukast (SINGULAIR) 10 MG tablet Take 1 tablet (10 mg total) by mouth at bedtime. 90 tablet 3  . Multiple Vitamin (MULTIVITAMIN) tablet Take 1 tablet by mouth daily. 90 tablet 3  . omeprazole (PRILOSEC) 20 MG capsule Take 1 capsule (20 mg total) by mouth daily. 90 capsule 3  . potassium chloride (K-DUR) 10 MEQ tablet Take 1 tablet (10 mEq total) by mouth daily. 90 tablet 3  . predniSONE (DELTASONE) 10 MG tablet 6/5/4/3/2/1 21 tablet 0  . PRODIGY TWIST TOP LANCETS 28G MISC TWICE weekly 100 each 2  . PROLIA 60 MG/ML SOLN injection 1 mL by Subconjunctival route every 6 (six) months.    . rivaroxaban (XARELTO)  20 MG TABS tablet Take 1 tablet (20 mg total) by mouth daily with supper. 90 tablet 3  . silver sulfADIAZINE (SILVADENE) 1 % cream Apply 1 application topically daily. 50 g 1  . simvastatin (ZOCOR) 20 MG tablet Take 1 tablet (20 mg total) by mouth daily at 6 PM. 90 tablet 0   No current facility-administered medications on file prior to visit.    ROS  as in subjective    Objective: BP 136/74   Pulse 78   Wt 134 lb (60.8 kg)   BMI 18.43 kg/m   Wt Readings from Last 3 Encounters:  07/01/17 134 lb (60.8 kg)  05/09/17 138 lb 9.6 oz (62.9 kg)  04/08/17 146 lb 9.6 oz (66.5 kg)   Gen: seated in chart  Beside walker.     Psych: seems her usual self, answering questions appropriately Head: no bruising, nontneder, no deformity Neuro: non focal, CN2-12 intact, arms hands legs back seems to have normal sensation and strength Purplish bruising left forearm mid lateral to posteriorly from distal forearm to mid forearm, tender throughout this area, decreased left wrist extension, pain with hard grip test, but finger ROM and strength seems to be WNL, there are 2 skin tears/ avulsion naproxen 2.5cm diameter in same are of forearm, there is purplish bruising over left base of thumb to MCP of 1st finger, tender in same area.   There is diamond shape patter of purplish bruising over right hand over 1st -4th fingers from MCP to mid hand, tender in same area.   There is crusted healing abrasion right lat earl elbow.   Tender in elbow in same area.   Other than left wrist, no obvious abnormal ROm or deformity.   There is senile purpura bruising of both forearms otherwise Tender in left low back paraspinal.  No obvious bruising in back or buttock.   But she is limited on ambulation today reportedly due to back pain. Legs nontneder, no bruising or obvious injury to legs.    Assessment: Encounter Diagnoses  Name Primary?  . Fall, initial encounter Yes  . Acute back pain, unspecified back location,  unspecified back pain laterality   . Type 2 diabetes mellitus with complication, without long-term current use of insulin ([place])   . Chronic kidney disease, unspecified CKD stage   . Abnormal urinalysis   . Anemia of chronic disease   . Anorexia   . Impaired mobility   . Weight loss   . Left forearm pain   . Left wrist pain   . Right hand pain   . Chronic pain syndrome       Plan: History limited today, exam somewhat difficult given her inability to pinpoint pain. We will request incident report from group home.  Fall, multiple injuries.  xrays today.  Will increase Hydrocodone short term to q6hr prn.  CMA cleaned and rebandaged the skin wounds of left forearm and right hand.  Labs today given anorexia and CKD, worsening from last visit labs.  Geraldean was seen today for back pain.  Diagnoses and all orders for this visit:  Fall, initial encounter -     DG Lumbar Spine Complete; Future -     DG Pelvis Comp Min 3V; Future  Acute back pain, unspecified back location, unspecified back pain laterality -     DG Lumbar Spine Complete; Future -     DG Pelvis Comp Min 3V; Future  Type 2 diabetes mellitus with complication, without long-term current use of insulin (H[facility])  Chronic kidney disease, unspecified CKD stage -     Comprehensive metabolic panel  Abnormal urinalysis  Anemia of chronic disease -     CBC with Differential/Platelet  Anorexia -     CBC with Differential/Platelet -     Comprehensive metabolic panel -     TSH -     Lactate dehydrogenase  Impaired mobility  Weight loss -  CBC with Differential/Platelet -     Comprehensive metabolic panel -     TSH -     Lactate dehydrogenase  Left forearm pain -     DG Forearm Left; Future  Left wrist pain -     DG Wrist 2 Views Left; Future  Right hand pain -     DG Hand Complete Right; Future  Chronic pain syndrome -     HYDROcodone-acetaminophen (NORCO) 5-325 MG tablet; Take 1 tablet by mouth every 6  (six) hours as needed for moderate pain. But c/t BID scheduled dosing.  Q6hr prn for 1 week   Spent > 45 minutes face to face with patient in discussion of symptoms, evaluation, plan and recommendations.

## 2017-07-01 NOTE — Addendum Note (Signed)
Addended by: Winn Jock on: 07/01/2017 10:10 AM   Modules accepted: Orders

## 2017-07-01 NOTE — Addendum Note (Signed)
Addended by: Winn Jock on: 07/01/2017 10:18 AM   Modules accepted: Orders

## 2017-07-02 ENCOUNTER — Telehealth: Payer: Self-pay | Admitting: Family Medicine

## 2017-07-02 ENCOUNTER — Other Ambulatory Visit: Payer: Self-pay | Admitting: Medical

## 2017-07-02 ENCOUNTER — Ambulatory Visit
Admission: RE | Admit: 2017-07-02 | Discharge: 2017-07-02 | Disposition: A | Discharge status: Home / Self Care | Payer: Medicare Other | Source: Ambulatory Visit | Attending: Medical | Admitting: Medical

## 2017-07-02 DIAGNOSIS — S3993XA Unspecified injury of pelvis, initial encounter: Secondary | ICD-10-CM | POA: Diagnosis not present

## 2017-07-02 DIAGNOSIS — W19XXXA Unspecified fall, initial encounter: Secondary | ICD-10-CM

## 2017-07-02 DIAGNOSIS — R102 Pelvic and perineal pain: Secondary | ICD-10-CM | POA: Diagnosis not present

## 2017-07-02 DIAGNOSIS — S6992XA Unspecified injury of left wrist, hand and finger(s), initial encounter: Secondary | ICD-10-CM | POA: Diagnosis not present

## 2017-07-02 DIAGNOSIS — M549 Dorsalgia, unspecified: Secondary | ICD-10-CM

## 2017-07-02 DIAGNOSIS — S6991XA Unspecified injury of right wrist, hand and finger(s), initial encounter: Secondary | ICD-10-CM | POA: Diagnosis not present

## 2017-07-02 DIAGNOSIS — M25532 Pain in left wrist: Secondary | ICD-10-CM

## 2017-07-02 DIAGNOSIS — M545 Low back pain: Secondary | ICD-10-CM | POA: Diagnosis not present

## 2017-07-02 DIAGNOSIS — M79641 Pain in right hand: Secondary | ICD-10-CM | POA: Diagnosis not present

## 2017-07-02 DIAGNOSIS — S3992XA Unspecified injury of lower back, initial encounter: Secondary | ICD-10-CM | POA: Diagnosis not present

## 2017-07-02 LAB — COMPREHENSIVE METABOLIC PANEL
ALT: 11 U/L (ref 6–29)
AST: 16 U/L (ref 10–35)
Albumin: 3.3 g/dL — ABNORMAL LOW (ref 3.6–5.1)
Alkaline Phosphatase: 74 U/L (ref 33–130)
BUN: 34 mg/dL — AB (ref 7–25)
CHLORIDE: 104 mmol/L (ref 98–110)
CO2: 23 mmol/L (ref 20–32)
CREATININE: 1.44 mg/dL — AB (ref 0.60–0.88)
Calcium: 8.7 mg/dL (ref 8.6–10.4)
Glucose, Bld: 184 mg/dL — ABNORMAL HIGH (ref 65–99)
POTASSIUM: 5.1 mmol/L (ref 3.5–5.3)
SODIUM: 141 mmol/L (ref 135–146)
Total Bilirubin: 0.4 mg/dL (ref 0.2–1.2)
Total Protein: 5.6 g/dL — ABNORMAL LOW (ref 6.1–8.1)

## 2017-07-02 LAB — TSH: TSH: 1.43 mIU/L

## 2017-07-02 LAB — LACTATE DEHYDROGENASE: LDH: 245 U/L (ref 120–250)

## 2017-07-02 NOTE — Telephone Encounter (Signed)
Called and spoke with [name] the nurse at the home where [name] lives , she said that fell on Thursday of last week and stopping eating on Wednesday. [name] said that had the dietitian come and evaluated her to find out why she is not eating. The dietitian said to fix her  What ever she would  to eat to see if she will eat. Pt was still wasn't eating. An appt was made for and also  because she had fallen in the bathroom.she was told her  walker got caught in the doorway causing her to fall and hit back. [name] said that she would not be able to give [place] a copy of  Report of her fall because the state will not allow it. She said that they will try to take her for her x-ray today.

## 2017-07-02 NOTE — Telephone Encounter (Signed)
Shane please let me correct [place] phone number [phone]

## 2017-07-02 NOTE — Telephone Encounter (Signed)
Vincenza Hews Tysinger advised need to call adult protective services:  Called Adult Protective services [phone] left message to return call. Concerns of : Patient fell on 8/30 and came into the office on 9/4.  Patient caregivers were told patient needs several xrays.  None completed as of today.  Request of patient fall incident report was declined from group home,  Stated the [place] told them they cannot give [place] a copy.    Patient needs xrays of rt hand, left wrist and forearm, back and pelvis.  Patient advised [place] while here that they let her fall and states she fell forward and hit her head.  Group attendee that brought her states she fell backwards.  Group attendee had no idea why she brought her to appointment, nor what her medications were.  Also concerns losing weight and food offered.  [facility]  [address]   [place], [place]  [address] [address] appears to be pt location.   While typing I received a call from Jennette Kettle of Social Services [phone] will let us know if this case is approve  Dorinda Hill just advised when she spoke with group home today, [name] the nurse lead her to believe the xrays were not priority.

## 2017-07-02 NOTE — Telephone Encounter (Signed)
I spoke with [name] this afternoon.   She was aware of the fall 2 days ago, but she was also frustrated when she learned today that the group home hadn't taken [facility] for xrays yet.    Regardless of yesterdays visit, Reece Levy is concerned that Para March continues to decline in general and she and Para March family/guardian feel its time she be moved to SNF.  They will get me FL2 and paperwork to sign.

## 2017-07-02 NOTE — Telephone Encounter (Signed)
Called [employer] [facility] [phone]   Voice mail full, sent email to call me. Called Newell Coral VP of [facility] [phone] voice mail full, sent email to call me.  This is the person [facility] location told me to call as Interior and spatial designer for their office.  Called Lorine Bears  VP of [facility] [phone] lmtr[facility] and sent email to call me.  I did not leave patient name.  Immediately Newell Coral called me back, she said she would call them and call me back.  I gave her my cell to reach me after hours.

## 2017-07-02 NOTE — Telephone Encounter (Signed)
[name] who is on HIPAA called and states she and [name]  now feel it is time for [name] to go into a nursing home.  [name] said 16 months ago pt was in too good of shape to go.  She states now she is in need of that extra care, that she is going down hill and has lost a lot of weight.  Please call [name] [phone] to discuss.

## 2017-07-02 NOTE — Telephone Encounter (Signed)
FYI  I am not trying to accuse group home of any wrongdoing.  My concern is Para March who has mental retardation was providing the history as the day program worker that came in had no first hand knowledge of how or when she fell.  It was important to learn if she hit her head and when the accident happened.   Between the day program worker calling the group home yesterday, and Dorinda Hill my CMA calling today, it still wasn't clear when the injury occurred, whether she hit her head or not, and the group home was defensive not wanting to give details of the injury report.  So I became concerned about these facts in addition to the fact that the xrays hadn't taken place by this afternoon the next day after she came in, as well as the fact the injury reportedly occurred at least 3+ days ago.  She was obviously in pain yesterday, had limited left wrist range of motion, and I was concerned about possibility of fracture.  She is ambulating using the walker and had a hard time doing this in the hall yesterday.  Additionally, at [facility] previous visit, I asked that they contact me if she continued to lose weight which she has.  For all of the reasons above, I consulted with Dr. Susann Givens supervising physical as well as Lafonda Mosses, office manager to help sort this out and come up with a plan.

## 2017-07-03 ENCOUNTER — Encounter: Payer: Self-pay | Admitting: Medical

## 2017-07-03 ENCOUNTER — Telehealth: Payer: Self-pay | Admitting: Family Medicine

## 2017-07-03 NOTE — Telephone Encounter (Signed)
Claude Manges coordinator with [employer] called for [employer].  [phone]

## 2017-07-03 NOTE — Telephone Encounter (Signed)
I called this morning and spoke to Painter about results, advised rest mostly other than assisted ambulation a few times today for circulation.  Advised that we would send over letter about results and orders.  About 12 pm today [name] from group home called back.  We discussed xray results, my recommendations, my concerns about the injury, lack of helpful information and delay in getting xrays, and need to go and have the left forearm xray.  We faxed over my letter from today outlining the recommendations.  I have also spoken with [name] today who is working to get Hester placed in a skilled nursing facility with more direct supervision and assistance given her decline, recent falls, current injuries, and questions about diet and nutrition.     We will await paperwork to sign for transfer.

## 2017-07-03 NOTE — Telephone Encounter (Signed)
Okay thank you

## 2017-07-03 NOTE — Telephone Encounter (Signed)
ok 

## 2017-07-03 NOTE — Telephone Encounter (Signed)
[name] with [employer] called and they have accepted the case.  [name] will be handling the case [phone].  The will begin the case within 3 days of yesterdays report.

## 2017-07-04 ENCOUNTER — Ambulatory Visit
Admission: RE | Admit: 2017-07-04 | Discharge: 2017-07-04 | Disposition: A | Discharge status: Home / Self Care | Payer: Medicare Other | Source: Ambulatory Visit | Attending: Medical | Admitting: Medical

## 2017-07-04 ENCOUNTER — Telehealth: Payer: Self-pay

## 2017-07-04 DIAGNOSIS — S59911A Unspecified injury of right forearm, initial encounter: Secondary | ICD-10-CM | POA: Diagnosis not present

## 2017-07-04 DIAGNOSIS — S40811A Abrasion of right upper arm, initial encounter: Secondary | ICD-10-CM

## 2017-07-04 DIAGNOSIS — Z9181 History of falling: Secondary | ICD-10-CM

## 2017-07-04 DIAGNOSIS — S40021A Contusion of right upper arm, initial encounter: Secondary | ICD-10-CM

## 2017-07-04 NOTE — Telephone Encounter (Signed)
You saw the other message from Friday correct?   Regarding PT, nutrition

## 2017-07-04 NOTE — Telephone Encounter (Signed)
[name] with the group home called about  [name] x-rays and p/t . She wants to know the results and what next step is for [name]. She said that are willing to take her to p/t appt. Already sent order to physical therapy

## 2017-07-04 NOTE — Addendum Note (Signed)
Addended by: Winn Jock on: 07/04/2017 02:25 PM   Modules accepted: Orders

## 2017-07-07 NOTE — Telephone Encounter (Signed)
Yes, spoke with [facility] p[facility]t  and [name] on Friday  To set up home p/t for her. And [name] said that she the nutritionist is coming out to see her this week.

## 2017-07-08 ENCOUNTER — Telehealth: Payer: Self-pay | Admitting: Medical

## 2017-07-08 NOTE — Telephone Encounter (Signed)
Adacia from [facility] came by office needing order signed by Dr. Susann Givens, also copies last office notes and demographics given

## 2017-07-09 ENCOUNTER — Telehealth: Payer: Self-pay

## 2017-07-09 DIAGNOSIS — F4321 Adjustment disorder with depressed mood: Secondary | ICD-10-CM | POA: Diagnosis not present

## 2017-07-09 DIAGNOSIS — F329 Major depressive disorder, single episode, unspecified: Secondary | ICD-10-CM | POA: Diagnosis not present

## 2017-07-09 DIAGNOSIS — F71 Moderate intellectual disabilities: Secondary | ICD-10-CM | POA: Diagnosis not present

## 2017-07-09 NOTE — Telephone Encounter (Signed)
[name] the nurse from the Group home said that she needed an order for home physical Therapy for Ms.Nanney Also she wants to if she can bring her by here to get her weight because the scale at the home are not sable enough for her to stand on. I have sent the order for the home p.t.

## 2017-07-10 DIAGNOSIS — M6281 Muscle weakness (generalized): Secondary | ICD-10-CM | POA: Diagnosis not present

## 2017-07-10 DIAGNOSIS — M25532 Pain in left wrist: Secondary | ICD-10-CM | POA: Diagnosis not present

## 2017-07-10 DIAGNOSIS — M545 Low back pain: Secondary | ICD-10-CM | POA: Diagnosis not present

## 2017-07-10 DIAGNOSIS — D631 Anemia in chronic kidney disease: Secondary | ICD-10-CM | POA: Diagnosis not present

## 2017-07-10 DIAGNOSIS — E1151 Type 2 diabetes mellitus with diabetic peripheral angiopathy without gangrene: Secondary | ICD-10-CM | POA: Diagnosis not present

## 2017-07-10 DIAGNOSIS — G894 Chronic pain syndrome: Secondary | ICD-10-CM | POA: Diagnosis not present

## 2017-07-10 DIAGNOSIS — M170 Bilateral primary osteoarthritis of knee: Secondary | ICD-10-CM | POA: Diagnosis not present

## 2017-07-10 DIAGNOSIS — Z993 Dependence on wheelchair: Secondary | ICD-10-CM | POA: Diagnosis not present

## 2017-07-10 DIAGNOSIS — E1122 Type 2 diabetes mellitus with diabetic chronic kidney disease: Secondary | ICD-10-CM | POA: Diagnosis not present

## 2017-07-10 DIAGNOSIS — Z7901 Long term (current) use of anticoagulants: Secondary | ICD-10-CM | POA: Diagnosis not present

## 2017-07-10 DIAGNOSIS — N189 Chronic kidney disease, unspecified: Secondary | ICD-10-CM | POA: Diagnosis not present

## 2017-07-10 DIAGNOSIS — G252 Other specified forms of tremor: Secondary | ICD-10-CM | POA: Diagnosis not present

## 2017-07-10 DIAGNOSIS — F79 Unspecified intellectual disabilities: Secondary | ICD-10-CM | POA: Diagnosis not present

## 2017-07-10 DIAGNOSIS — F329 Major depressive disorder, single episode, unspecified: Secondary | ICD-10-CM | POA: Diagnosis not present

## 2017-07-10 DIAGNOSIS — I129 Hypertensive chronic kidney disease with stage 1 through stage 4 chronic kidney disease, or unspecified chronic kidney disease: Secondary | ICD-10-CM | POA: Diagnosis not present

## 2017-07-10 DIAGNOSIS — J45909 Unspecified asthma, uncomplicated: Secondary | ICD-10-CM | POA: Diagnosis not present

## 2017-07-16 DIAGNOSIS — M170 Bilateral primary osteoarthritis of knee: Secondary | ICD-10-CM | POA: Diagnosis not present

## 2017-07-16 DIAGNOSIS — M6281 Muscle weakness (generalized): Secondary | ICD-10-CM | POA: Diagnosis not present

## 2017-07-16 DIAGNOSIS — M545 Low back pain: Secondary | ICD-10-CM | POA: Diagnosis not present

## 2017-07-16 DIAGNOSIS — E1122 Type 2 diabetes mellitus with diabetic chronic kidney disease: Secondary | ICD-10-CM | POA: Diagnosis not present

## 2017-07-16 DIAGNOSIS — M25532 Pain in left wrist: Secondary | ICD-10-CM | POA: Diagnosis not present

## 2017-07-16 DIAGNOSIS — F79 Unspecified intellectual disabilities: Secondary | ICD-10-CM | POA: Diagnosis not present

## 2017-07-18 DIAGNOSIS — M25532 Pain in left wrist: Secondary | ICD-10-CM | POA: Diagnosis not present

## 2017-07-18 DIAGNOSIS — E1122 Type 2 diabetes mellitus with diabetic chronic kidney disease: Secondary | ICD-10-CM | POA: Diagnosis not present

## 2017-07-18 DIAGNOSIS — M6281 Muscle weakness (generalized): Secondary | ICD-10-CM | POA: Diagnosis not present

## 2017-07-18 DIAGNOSIS — M545 Low back pain: Secondary | ICD-10-CM | POA: Diagnosis not present

## 2017-07-18 DIAGNOSIS — M170 Bilateral primary osteoarthritis of knee: Secondary | ICD-10-CM | POA: Diagnosis not present

## 2017-07-18 DIAGNOSIS — F79 Unspecified intellectual disabilities: Secondary | ICD-10-CM | POA: Diagnosis not present

## 2017-07-21 DIAGNOSIS — F79 Unspecified intellectual disabilities: Secondary | ICD-10-CM | POA: Diagnosis not present

## 2017-07-21 DIAGNOSIS — E1122 Type 2 diabetes mellitus with diabetic chronic kidney disease: Secondary | ICD-10-CM | POA: Diagnosis not present

## 2017-07-21 DIAGNOSIS — M25532 Pain in left wrist: Secondary | ICD-10-CM | POA: Diagnosis not present

## 2017-07-21 DIAGNOSIS — M545 Low back pain: Secondary | ICD-10-CM | POA: Diagnosis not present

## 2017-07-21 DIAGNOSIS — M6281 Muscle weakness (generalized): Secondary | ICD-10-CM | POA: Diagnosis not present

## 2017-07-21 DIAGNOSIS — M170 Bilateral primary osteoarthritis of knee: Secondary | ICD-10-CM | POA: Diagnosis not present

## 2017-07-23 DIAGNOSIS — M170 Bilateral primary osteoarthritis of knee: Secondary | ICD-10-CM | POA: Diagnosis not present

## 2017-07-23 DIAGNOSIS — M545 Low back pain: Secondary | ICD-10-CM | POA: Diagnosis not present

## 2017-07-23 DIAGNOSIS — E1122 Type 2 diabetes mellitus with diabetic chronic kidney disease: Secondary | ICD-10-CM | POA: Diagnosis not present

## 2017-07-23 DIAGNOSIS — M25532 Pain in left wrist: Secondary | ICD-10-CM | POA: Diagnosis not present

## 2017-07-23 DIAGNOSIS — F79 Unspecified intellectual disabilities: Secondary | ICD-10-CM | POA: Diagnosis not present

## 2017-07-23 DIAGNOSIS — M6281 Muscle weakness (generalized): Secondary | ICD-10-CM | POA: Diagnosis not present

## 2017-07-25 ENCOUNTER — Inpatient Hospital Stay (HOSPITAL_COMMUNITY): Payer: Medicare Other

## 2017-07-25 ENCOUNTER — Encounter (HOSPITAL_COMMUNITY): Payer: Self-pay

## 2017-07-25 ENCOUNTER — Emergency Department (HOSPITAL_COMMUNITY): Payer: Medicare Other

## 2017-07-25 ENCOUNTER — Inpatient Hospital Stay (HOSPITAL_COMMUNITY)
Admission: EM | Admit: 2017-07-25 | Discharge: 2017-07-29 | DRG: 065 | Disposition: A | Discharge status: Skilled Nursing Facility | Payer: Medicare Other | Attending: Internal Medicine | Admitting: Internal Medicine

## 2017-07-25 DIAGNOSIS — K219 Gastro-esophageal reflux disease without esophagitis: Secondary | ICD-10-CM | POA: Diagnosis present

## 2017-07-25 DIAGNOSIS — E119 Type 2 diabetes mellitus without complications: Secondary | ICD-10-CM | POA: Diagnosis not present

## 2017-07-25 DIAGNOSIS — D696 Thrombocytopenia, unspecified: Secondary | ICD-10-CM | POA: Diagnosis present

## 2017-07-25 DIAGNOSIS — I639 Cerebral infarction, unspecified: Secondary | ICD-10-CM | POA: Diagnosis not present

## 2017-07-25 DIAGNOSIS — R296 Repeated falls: Secondary | ICD-10-CM

## 2017-07-25 DIAGNOSIS — D649 Anemia, unspecified: Secondary | ICD-10-CM | POA: Diagnosis not present

## 2017-07-25 DIAGNOSIS — Z87891 Personal history of nicotine dependence: Secondary | ICD-10-CM

## 2017-07-25 DIAGNOSIS — R29700 NIHSS score 0: Secondary | ICD-10-CM | POA: Diagnosis present

## 2017-07-25 DIAGNOSIS — J90 Pleural effusion, not elsewhere classified: Secondary | ICD-10-CM | POA: Diagnosis not present

## 2017-07-25 DIAGNOSIS — R278 Other lack of coordination: Secondary | ICD-10-CM | POA: Diagnosis not present

## 2017-07-25 DIAGNOSIS — J918 Pleural effusion in other conditions classified elsewhere: Secondary | ICD-10-CM | POA: Diagnosis not present

## 2017-07-25 DIAGNOSIS — Z86718 Personal history of other venous thrombosis and embolism: Secondary | ICD-10-CM

## 2017-07-25 DIAGNOSIS — J4540 Moderate persistent asthma, uncomplicated: Secondary | ICD-10-CM | POA: Diagnosis present

## 2017-07-25 DIAGNOSIS — F0390 Unspecified dementia without behavioral disturbance: Secondary | ICD-10-CM | POA: Diagnosis present

## 2017-07-25 DIAGNOSIS — I10 Essential (primary) hypertension: Secondary | ICD-10-CM | POA: Diagnosis present

## 2017-07-25 DIAGNOSIS — S2241XA Multiple fractures of ribs, right side, initial encounter for closed fracture: Secondary | ICD-10-CM | POA: Diagnosis not present

## 2017-07-25 DIAGNOSIS — I129 Hypertensive chronic kidney disease with stage 1 through stage 4 chronic kidney disease, or unspecified chronic kidney disease: Secondary | ICD-10-CM | POA: Diagnosis present

## 2017-07-25 DIAGNOSIS — F329 Major depressive disorder, single episode, unspecified: Secondary | ICD-10-CM | POA: Diagnosis present

## 2017-07-25 DIAGNOSIS — R251 Tremor, unspecified: Secondary | ICD-10-CM | POA: Diagnosis present

## 2017-07-25 DIAGNOSIS — Z79899 Other long term (current) drug therapy: Secondary | ICD-10-CM

## 2017-07-25 DIAGNOSIS — E1151 Type 2 diabetes mellitus with diabetic peripheral angiopathy without gangrene: Secondary | ICD-10-CM | POA: Diagnosis present

## 2017-07-25 DIAGNOSIS — D638 Anemia in other chronic diseases classified elsewhere: Secondary | ICD-10-CM | POA: Diagnosis present

## 2017-07-25 DIAGNOSIS — R269 Unspecified abnormalities of gait and mobility: Secondary | ICD-10-CM

## 2017-07-25 DIAGNOSIS — I421 Obstructive hypertrophic cardiomyopathy: Secondary | ICD-10-CM | POA: Diagnosis present

## 2017-07-25 DIAGNOSIS — E785 Hyperlipidemia, unspecified: Secondary | ICD-10-CM | POA: Diagnosis present

## 2017-07-25 DIAGNOSIS — F70 Mild intellectual disabilities: Secondary | ICD-10-CM | POA: Diagnosis present

## 2017-07-25 DIAGNOSIS — S2239XA Fracture of one rib, unspecified side, initial encounter for closed fracture: Secondary | ICD-10-CM

## 2017-07-25 DIAGNOSIS — Z794 Long term (current) use of insulin: Secondary | ICD-10-CM

## 2017-07-25 DIAGNOSIS — E1122 Type 2 diabetes mellitus with diabetic chronic kidney disease: Secondary | ICD-10-CM | POA: Diagnosis present

## 2017-07-25 DIAGNOSIS — J45909 Unspecified asthma, uncomplicated: Secondary | ICD-10-CM | POA: Diagnosis present

## 2017-07-25 DIAGNOSIS — N183 Chronic kidney disease, stage 3 unspecified: Secondary | ICD-10-CM | POA: Diagnosis present

## 2017-07-25 DIAGNOSIS — R270 Ataxia, unspecified: Secondary | ICD-10-CM | POA: Diagnosis not present

## 2017-07-25 DIAGNOSIS — R1312 Dysphagia, oropharyngeal phase: Secondary | ICD-10-CM | POA: Diagnosis not present

## 2017-07-25 DIAGNOSIS — R41841 Cognitive communication deficit: Secondary | ICD-10-CM | POA: Diagnosis not present

## 2017-07-25 DIAGNOSIS — R259 Unspecified abnormal involuntary movements: Secondary | ICD-10-CM | POA: Diagnosis not present

## 2017-07-25 DIAGNOSIS — S2249XA Multiple fractures of ribs, unspecified side, initial encounter for closed fracture: Secondary | ICD-10-CM

## 2017-07-25 DIAGNOSIS — R2990 Unspecified symptoms and signs involving the nervous system: Secondary | ICD-10-CM | POA: Insufficient documentation

## 2017-07-25 DIAGNOSIS — F89 Unspecified disorder of psychological development: Secondary | ICD-10-CM | POA: Diagnosis present

## 2017-07-25 DIAGNOSIS — G464 Cerebellar stroke syndrome: Secondary | ICD-10-CM | POA: Diagnosis not present

## 2017-07-25 DIAGNOSIS — G894 Chronic pain syndrome: Secondary | ICD-10-CM

## 2017-07-25 DIAGNOSIS — Z7901 Long term (current) use of anticoagulants: Secondary | ICD-10-CM

## 2017-07-25 DIAGNOSIS — W19XXXA Unspecified fall, initial encounter: Secondary | ICD-10-CM | POA: Diagnosis present

## 2017-07-25 DIAGNOSIS — I6789 Other cerebrovascular disease: Secondary | ICD-10-CM | POA: Diagnosis not present

## 2017-07-25 DIAGNOSIS — R262 Difficulty in walking, not elsewhere classified: Secondary | ICD-10-CM | POA: Diagnosis present

## 2017-07-25 DIAGNOSIS — R498 Other voice and resonance disorders: Secondary | ICD-10-CM | POA: Diagnosis not present

## 2017-07-25 DIAGNOSIS — I638 Other cerebral infarction: Secondary | ICD-10-CM | POA: Diagnosis not present

## 2017-07-25 DIAGNOSIS — R293 Abnormal posture: Secondary | ICD-10-CM | POA: Diagnosis not present

## 2017-07-25 LAB — BASIC METABOLIC PANEL
Anion gap: 6 (ref 5–15)
BUN: 30 mg/dL — AB (ref 6–20)
CALCIUM: 8.8 mg/dL — AB (ref 8.9–10.3)
CO2: 27 mmol/L (ref 22–32)
CREATININE: 1.53 mg/dL — AB (ref 0.44–1.00)
Chloride: 108 mmol/L (ref 101–111)
GFR calc non Af Amer: 31 mL/min — ABNORMAL LOW (ref >60)
GFR, EST AFRICAN AMERICAN: 36 mL/min — AB (ref >60)
Glucose, Bld: 121 mg/dL — ABNORMAL HIGH (ref 65–99)
Potassium: 4.4 mmol/L (ref 3.5–5.1)
SODIUM: 141 mmol/L (ref 135–145)

## 2017-07-25 LAB — CBC
HCT: 32.3 % — ABNORMAL LOW (ref 36.0–46.0)
Hemoglobin: 10.2 g/dL — ABNORMAL LOW (ref 12.0–15.0)
MCH: 29.9 pg (ref 26.0–34.0)
MCHC: 31.6 g/dL (ref 30.0–36.0)
MCV: 94.7 fL (ref 78.0–100.0)
PLATELETS: 130 10*3/uL — AB (ref 150–400)
RBC: 3.41 MIL/uL — ABNORMAL LOW (ref 3.87–5.11)
RDW: 12.5 % (ref 11.5–15.5)
WBC: 6.2 10*3/uL (ref 4.0–10.5)

## 2017-07-25 LAB — URINALYSIS, ROUTINE W REFLEX MICROSCOPIC
Bilirubin Urine: NEGATIVE
GLUCOSE, UA: NEGATIVE mg/dL
HGB URINE DIPSTICK: NEGATIVE
Ketones, ur: NEGATIVE mg/dL
NITRITE: NEGATIVE
PROTEIN: NEGATIVE mg/dL
SPECIFIC GRAVITY, URINE: 1.012 (ref 1.005–1.030)
pH: 6 (ref 5.0–8.0)

## 2017-07-25 MED ORDER — ALBUTEROL SULFATE (2.5 MG/3ML) 0.083% IN NEBU
2.5000 mg | INHALATION_SOLUTION | Freq: Four times a day (QID) | RESPIRATORY_TRACT | Status: DC | PRN
Start: 2017-07-25 — End: 2017-07-29

## 2017-07-25 MED ORDER — LORAZEPAM 2 MG/ML IJ SOLN
0.5000 mg | Freq: Once | INTRAMUSCULAR | Status: AC | PRN
  Administered 2017-07-25: 0.5 mg via INTRAMUSCULAR
  Filled 2017-07-25: qty 1

## 2017-07-25 MED ORDER — STROKE: EARLY STAGES OF RECOVERY BOOK
Freq: Once | Status: AC
  Administered 2017-07-25: 22:00:00

## 2017-07-25 MED ORDER — BUDESONIDE 0.5 MG/2ML IN SUSP
0.5000 mg | Freq: Two times a day (BID) | RESPIRATORY_TRACT | Status: DC
  Administered 2017-07-26 – 2017-07-29 (×5): 0.5 mg via RESPIRATORY_TRACT
  Filled 2017-07-25 (×9): qty 2

## 2017-07-25 MED ORDER — FLUTICASONE PROPIONATE 50 MCG/ACT NA SUSP
2.0000 | Freq: Every day | NASAL | Status: DC
  Administered 2017-07-27 – 2017-07-29 (×3): 2 via NASAL
  Filled 2017-07-25 (×2): qty 16

## 2017-07-25 MED ORDER — ASPIRIN 325 MG PO TABS
325.0000 mg | ORAL_TABLET | Freq: Every day | ORAL | Status: DC
  Administered 2017-07-25 – 2017-07-26 (×2): 325 mg via ORAL
  Filled 2017-07-25: qty 1

## 2017-07-25 MED ORDER — SIMVASTATIN 20 MG PO TABS: 20.0000 mg | ORAL_TABLET | Freq: Every day | ORAL | Status: DC

## 2017-07-25 MED ORDER — ACETAMINOPHEN 650 MG RE SUPP: 650.0000 mg | RECTAL | Status: DC | PRN

## 2017-07-25 MED ORDER — ACETAMINOPHEN 160 MG/5ML PO SOLN: 650.0000 mg | ORAL | Status: DC | PRN

## 2017-07-25 MED ORDER — LORAZEPAM 2 MG/ML IJ SOLN: 0.5000 mg | INTRAMUSCULAR | Status: DC | PRN

## 2017-07-25 MED ORDER — PANTOPRAZOLE SODIUM 40 MG PO TBEC
40.0000 mg | DELAYED_RELEASE_TABLET | Freq: Every day | ORAL | Status: DC
  Administered 2017-07-26 – 2017-07-29 (×4): 40 mg via ORAL
  Filled 2017-07-25 (×4): qty 1

## 2017-07-25 MED ORDER — MONTELUKAST SODIUM 10 MG PO TABS
10.0000 mg | ORAL_TABLET | Freq: Every day | ORAL | Status: DC
  Administered 2017-07-25 – 2017-07-28 (×4): 10 mg via ORAL
  Filled 2017-07-25 (×4): qty 1

## 2017-07-25 MED ORDER — BUPROPION HCL ER (SR) 150 MG PO TB12
150.0000 mg | ORAL_TABLET | Freq: Two times a day (BID) | ORAL | Status: DC
  Administered 2017-07-25 – 2017-07-29 (×8): 150 mg via ORAL
  Filled 2017-07-25 (×8): qty 1

## 2017-07-25 MED ORDER — ASPIRIN 300 MG RE SUPP: 300.0000 mg | Freq: Every day | RECTAL | Status: DC

## 2017-07-25 MED ORDER — ACETAMINOPHEN 325 MG PO TABS
650.0000 mg | ORAL_TABLET | ORAL | Status: DC | PRN
  Administered 2017-07-27 – 2017-07-28 (×2): 650 mg via ORAL
  Filled 2017-07-25 (×2): qty 2

## 2017-07-25 MED ORDER — MORPHINE SULFATE (PF) 4 MG/ML IV SOLN
1.0000 mg | INTRAVENOUS | Status: DC | PRN
  Filled 2017-07-25: qty 1

## 2017-07-25 MED ORDER — DIVALPROEX SODIUM ER 500 MG PO TB24
500.0000 mg | ORAL_TABLET | Freq: Two times a day (BID) | ORAL | Status: DC
  Administered 2017-07-25 – 2017-07-26 (×2): 500 mg via ORAL
  Filled 2017-07-25 (×2): qty 1

## 2017-07-25 MED ORDER — LORAZEPAM 0.5 MG PO TABS
0.5000 mg | ORAL_TABLET | Freq: Three times a day (TID) | ORAL | Status: DC
  Administered 2017-07-25 – 2017-07-29 (×11): 0.5 mg via ORAL
  Filled 2017-07-25 (×11): qty 1

## 2017-07-25 MED ORDER — FLUOXETINE HCL 20 MG PO CAPS
40.0000 mg | ORAL_CAPSULE | Freq: Every day | ORAL | Status: DC
  Administered 2017-07-25 – 2017-07-29 (×5): 40 mg via ORAL
  Filled 2017-07-25 (×4): qty 2

## 2017-07-25 NOTE — ED Notes (Signed)
MD Lui at the bedside.  
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
## 2017-07-25 NOTE — ED Provider Notes (Signed)
[name] is a 82 y.o. female, with a history of MR, anemia, CKD, DM, HTN, DVT on lifelong anticoagulation, presenting to the ED with gait deficit and general difficulty with ambulation. Last known normal was around 12pm on 9/27. Patient has no complaints on my initial interview.  HPI from [place], PA-C: "[name] is a 82 y.o. female with a history of hypertension, diabetes, GERD, chronic kidney disease, tremors and mental retardation, [facility] with trouble with ambulation. When asked why brought the patient to the hospital today she states "I don't know", denies any pain today. EMS reports this started yesterday around noon when they report the patient seemed to be leaning to the right and was having some difficulty walking, patient uses a walker most of the time to get around, but occassionally uses a wheelchair, and she seemed to be having more trouble with her gait yesterday. Caregiver is present with patient and reports when she came into work this morning the patient was in the [employer] in her wheel chair and she typically walks to the kitchen with her walker. Pt can usually make it to the bathroom, but has incontinence sometimes. Caregiver reports at baseline patient is oriented to self, and can speak conversationally, but is not typically oriented to place or time, she reports no recent falls or head trauma. At this time patient denies any pain, denies any fevers or chills, chest pain, shortness of breath, abdominal pain, back pain, or urinary symptoms."  Past Medical History:  Diagnosis Date  . Allergy   . Anemia of chronic disease 10/2010   stable as of 2015, on iron therapy for mild iron deficiency, chronic kidney disease, anemia of chronic disease; Dr. Ralene Ok prior consult  . Chronic kidney disease   . Constipation    mild intermittent  . Depression    [facility] - NP Eugenie Norrie  . Diabetes mellitus   .  Diabetes type 2, controlled ([place])   . Edentulous   . Falls   . GERD (gastroesophageal reflux disease)   . H/O cardiovascular stress test 06/2014   nuclear stress test normal; Dr. Wyatt Haste  . H/O echocardiogram 06/17/2014   mild LVH, EF 60-65%, no wall motion abnormalities // Echo 11/17: EF 55-60, normal wall motion, grade 2 diastolic dysfunction, MAC  . H/O mammogram 08/04/2014   normal  . History of MRI of brain and brain stem 11/2010   normal MRI of brain  . Hx of deep venous thrombosis    lifelong anticoagulant therapy  . Hyperlipidemia   . Hypertension   . Hypertrophic obstructive cardiomyopathy (HOCM) ([place]) 2015   normal echo and stress test other than mild LVH 06/2014; Dr. Dorris Carnes  . Long term current use of anticoagulant therapy    due to hx/o recurrent DVT  . Mild mental retardation   . Moderate persistent asthma    asthma and bronchiectasis, pulm consult 2015; unable to do PFTs, 2015  . Mood disorder ([place])    [facility] - NP Eugenie Norrie  . Osteoarthritis of both knees    Dr. Frederik Pear  . Osteopenia 2013   improvements on Boniva and Ca+D from 2011-2013.  2013 Bone Density normal /improved; repeat Bone Density scan 2016  . PVD (peripheral vascular disease) ([place])   . Shortness of breath    06/2014 cardiac consult, SOB seems to be related to poor technique with handheld inhaler, switched to  nebs with much improvement  . Thrombocytopenia ([place]) 10/2010   due to medications and immune dysregulation likely, stable as of 2015; no further investigation; hematology, Dr. Ralene Ok  . Tremor 2015   consult with Dr. Netta Neat Neurology.  Parkinsonian tremor without Parkinsons    Physical Exam  BP 128/74   Pulse 79   Temp 98 F (36.7 C) (Oral)   Resp (!) 23   Ht _0  (1.727 m)   Wt 60.8 kg (134 lb)   SpO2 98%   BMI 20.37 kg/m   Physical Exam  Constitutional: She appears well-developed and well-nourished. No distress.  HENT:  Head: Normocephalic  and atraumatic.  Eyes: Conjunctivae are normal.  Neck: Neck supple.  Cardiovascular: Normal rate, regular rhythm, normal heart sounds and intact distal pulses.   Pulmonary/Chest: She has decreased breath sounds in the right lower field. She exhibits no tenderness.  No noted increased work of breathing.  No noted tenderness, deformity, swelling, ecchymosis, crepitus, or other abnormalities noted to the chest wall.  Abdominal: Soft. There is no tenderness. There is no guarding.  Musculoskeletal: She exhibits no edema.  Lymphadenopathy:    She has no cervical adenopathy.  Neurological: She is alert.  Resting tremor noted. Patient is unable to sit upright and stay stable. Please see the full neuro exam performed by Benedetto Goad, PA-C.  Skin: Skin is warm and dry. Capillary refill takes less than 2 seconds. She is not diaphoretic.  Psychiatric: She has a normal mood and affect. Her behavior is normal.  Nursing note and vitals reviewed.   ED Course  Procedures  Results for orders placed or performed during the hospital encounter of 07/25/17  CBC  Result Value Ref Range   WBC 6.2 4.0 - 10.5 K/uL   RBC 3.41 (L) 3.87 - 5.11 MIL/uL   Hemoglobin 10.2 (L) 12.0 - 15.0 g/dL   HCT 32.3 (L) 36.0 - 46.0 %   MCV 94.7 78.0 - 100.0 fL   MCH 29.9 26.0 - 34.0 pg   MCHC 31.6 30.0 - 36.0 g/dL   RDW 12.5 11.5 - 15.5 %   Platelets 130 (L) 150 - 400 K/uL  Basic metabolic panel  Result Value Ref Range   Sodium 141 135 - 145 mmol/L   Potassium 4.4 3.5 - 5.1 mmol/L   Chloride 108 101 - 111 mmol/L   CO2 27 22 - 32 mmol/L   Glucose, Bld 121 (H) 65 - 99 mg/dL   BUN 30 (H) 6 - 20 mg/dL   Creatinine, Ser 1.53 (H) 0.44 - 1.00 mg/dL   Calcium 8.8 (L) 8.9 - 10.3 mg/dL   GFR calc non Af Amer 31 (L) >60 mL/min   GFR calc Af Amer 36 (L) >60 mL/min   Anion gap 6 5 - 15  Urinalysis, Routine w reflex microscopic  Result Value Ref Range   Color, Urine YELLOW YELLOW   APPearance HAZY (A) CLEAR   Specific Gravity,  Urine 1.012 1.005 - 1.030   pH 6.0 5.0 - 8.0   Glucose, UA NEGATIVE NEGATIVE mg/dL   Hgb urine dipstick NEGATIVE NEGATIVE   Bilirubin Urine NEGATIVE NEGATIVE   Ketones, ur NEGATIVE NEGATIVE mg/dL   Protein, ur NEGATIVE NEGATIVE mg/dL   Nitrite NEGATIVE NEGATIVE   Leukocytes, UA TRACE (A) NEGATIVE   RBC / HPF 0-5 0 - 5 RBC/hpf   WBC, UA 0-5 0 - 5 WBC/hpf   Bacteria, UA RARE (A) NONE SEEN   Squamous Epithelial / LPF 0-5 (A) NONE  SEEN   Mucus PRESENT    Dg Chest 2 View  Result Date: 07/25/2017 CLINICAL DATA:  Weakness.  Ex-smoker. EXAM: CHEST  2 VIEW COMPARISON:  05/09/2017. FINDINGS: Interval significantly displaced fractures of the right third, fourth, fifth, sixth and seventh ribs posteriorly. Moderate-sized right pleural effusion and minimal left pleural effusion. Mild right basilar atelectasis and minimal left basilar atelectasis. Diffuse peribronchial thickening without significant change. No pneumothorax. Thoracic spine degenerative changes. IMPRESSION: 1. Interval displaced right third through seventh rib fractures. 2. Moderate-sized right pleural effusion and minimal left pleural effusion. 3. Mild right basilar atelectasis and minimal left basilar atelectasis. 4. Stable chronic bronchitic changes. Electronically Signed   By: Claudie Revering M.D.   On: 07/25/2017 10:12   Dg Lumbar Spine Complete  Result Date: 07/03/2017 CLINICAL DATA:  [place] 2 weeks ago, low back pain since that time EXAM: LUMBAR SPINE - COMPLETE 4+ VIEW COMPARISON:  Lumbar spine films of 08/25/2013 FINDINGS: There is no change alignment of the lumbar vertebrae with mild retrolistheses of L3 on L4, L4 on L5, and L5 on S1, as noted previously. Degenerative disc disease is noted diffusely. No interval compression deformity is seen. Abdominal aortic atherosclerosis is noted. The SI joints appear corticated. IMPRESSION: 1. No acute compression deformity. 2. No change in diffuse degenerative disc disease and mild retrolistheses as  noted above. Electronically Signed   By: Ivar Drape M.D.   On: 07/03/2017 08:16   Dg Pelvis 1-2 Views  Result Date: 07/03/2017 CLINICAL DATA:  Status post fall 2 weeks ago with generalize low back and pelvic pain. EXAM: PELVIS - 1-2 VIEW COMPARISON:  Pelvis series of August 25, 2013 FINDINGS: There is an abnormal appearance of the superior pubic ramus on the left consistent with a healing fracture a small amount of periosteal reaction is visible. A subtle lucency along the inferior aspect of the inferior pubic ramus could reflect a fracture but this is not a definite finding. The pubic bones on the right are intact. The iliac bones are intact. The observed portions of the sacrum are normal. There degenerative changes of the lumbar spine. The hip joint space on the right is mildly narrowed. IMPRESSION: Findings compatible with a subacute fracture of the left superior pubic ramus. I cannot exclude a nondisplaced fracture through the medial aspect of the left inferior pubic ramus. Elsewhere the pelvis appears intact. There is subtle lucency in the intertrochanteric region of the left hip. This was faintly visible on the previous study. A left femur series would be useful. Electronically Signed   By: David  Martinique M.D.   On: 07/03/2017 08:15   Dg Forearm Right  Result Date: 07/04/2017 CLINICAL DATA:  Status post fall 2 weeks ago. EXAM: RIGHT FOREARM - 2 VIEW COMPARISON:  None. FINDINGS: There is no evidence of fracture or other focal bone lesions. There is a K-wire transfixing the fourth metacarpal base extending into the hamate. Soft tissues are unremarkable. IMPRESSION: No acute osseous injury of the right forearm. Electronically Signed   By: Kathreen Devoid   On: 07/04/2017 11:29   Dg Wrist 2 Views Left  Result Date: 07/03/2017 CLINICAL DATA:  [place] 2 weeks ago with persistent left wrist pain EXAM: LEFT WRIST - 2 VIEW COMPARISON:  None. FINDINGS: The left radiocarpal joint space is mildly narrowed with only  mild degenerative change. The carpal bones are in normal position with normal intercarpal joint spaces. No fracture is seen and no malalignment is noted. IMPRESSION: Only mild degenerative change.  No acute abnormality. Electronically Signed   By: Ivar Drape M.D.   On: 07/03/2017 08:18   Ct Head Wo Contrast  Result Date: 07/25/2017 CLINICAL DATA:  Difficulty with ambulation.  Tremors. EXAM: CT HEAD WITHOUT CONTRAST TECHNIQUE: Contiguous axial images were obtained from the base of the skull through the vertex without intravenous contrast. COMPARISON:  May 20, 2016 FINDINGS: Brain: Mild diffuse atrophy is stable. There is no intracranial mass, hemorrhage, extra-axial fluid collection, or midline shift. There is patchy small vessel disease in the centra semiovale bilaterally. Elsewhere gray-white compartments are normal. No evident acute infarct. Vascular: No hyperdense vessel evident. There is calcification in each cavernous carotid artery region. Skull: Bony calvarium appears intact. Sinuses/Orbits: There is mucosal thickening in several ethmoid air cells. Other visualized paranasal sinuses are clear. Orbits appear symmetric bilaterally. Patient has had cataract removal bilaterally. Other: Postoperative change noted in the left mastoid region. Aerated mastoid air cells bilaterally are clear. There is debris in each external auditory canal. IMPRESSION: 1. Mild atrophy with patchy periventricular small vessel disease, stable. No intracranial mass, hemorrhage, or extra-axial fluid collection. No evident acute infarct. 2.  Mild arterial vascular calcification noted. 3. Postoperative change left mastoid region. Aerated mastoid air cells are clear. 4.  Mucosal thickening in several ethmoid air cells. 5.  Probable cerumen in each external auditory canal. Electronically Signed   By: Lowella Grip III M.D.   On: 07/25/2017 10:09   Mr Brain Wo Contrast  Result Date: 07/25/2017 CLINICAL DATA:  Ataxia.  Difficulty  walking. EXAM: MRI HEAD WITHOUT CONTRAST TECHNIQUE: Multiplanar, multiecho pulse sequences of the brain and surrounding structures were obtained without intravenous contrast. COMPARISON:  Head CT 07/25/2017 FINDINGS: The examination had to be discontinued prior to completion due to claustrophobia despite receiving medication. Axial coronal diffusion, axial T2, and axial FLAIR sequences were obtained and are mildly motion degraded. Brain: There is a 2 mm focus of hyperintense signal in the superior right cerebellum on the axial diffusion sequence with corresponding reduced ADC, not confirmed on the coronal diffusion sequences though evaluation is limited by its small size and slice selection. There is no evidence of acute infarct elsewhere. No intracranial hemorrhage, mass, midline shift, or extra-axial fluid collection is identified. There is mild generalized cerebral atrophy for age. Periventricular white matter T2 hyperintensity is nonspecific but compatible with minimal chronic small vessel ischemic disease, not greater than expected for age. Vascular: Major intracranial vascular flow voids are preserved. Skull and upper cervical spine: No gross osseous lesion. Sinuses/Orbits: Bilateral cataract extraction. Paranasal sinuses and mastoid air cells are clear. Trace fluid in the new nasopharynx. Other: None. IMPRESSION: 1. Motion degraded, incomplete examination. 2. Suspected punctate acute infarct in the right cerebellum. Electronically Signed   By: Logan Bores M.D.   On: 07/25/2017 15:22   Dg Hand Complete Right  Result Date: 07/03/2017 CLINICAL DATA:  [place] 2 weeks ago with right hand pain EXAM: RIGHT HAND - COMPLETE 3+ VIEW COMPARISON:  Right hand films of 02/13/2016 FINDINGS: K-wire remains within the base of the fifth metacarpal extending into the hamate bone. No acute fracture is seen. There are degenerative changes involving the DIP and to a lesser degree PIP joints diffusely. IMPRESSION: No acute  fracture.  Degenerative and post traumatic change. Electronically Signed   By: Ivar Drape M.D.   On: 07/03/2017 08:20    MDM  Clinical Course as of Jul 25 1732  Fri Jul 25, 2017  1501 Took patient care handoff report from  Benedetto Goad, PA-C. Plan: MR Brain pending to rule out cerebellar stroke. If normal, attempt to ambulate again. Admit for observation and PT assessment for possible higher level of care on discharge.  [SJ]  1600 Spoke with Erin Hearing with [facility], who agrees to admit the patient.  [SJ]  6468 Spoke with Dr. Rosendo Gros, trauma surgeon on call. Agrees to consult on the patient.  [SJ]    Clinical Course User Index [SJ] Shion Bluestein C, PA-C     Patient presented for acute difficulty ambulating. Cerebellar stroke noted on MRI. Admission for stroke workup. Trauma surgery consult due to displaced rib fractures, pleural effusion, decreased lung sounds, and inability to accurately communicate.   Vitals:   07/25/17 1245 07/25/17 1315 07/25/17 1330 07/25/17 1400  BP: 116/66 (!) 129/54 (!) 125/59 128/74  Pulse: 74 74 73 79  Resp: _0 (!) 23  Temp:      TempSrc:      SpO2: 95% 94% 94% 98%  Weight:      Height:          Layla Maw 07/25/17 1733    Virgel Manifold, MD 07/25/17 1947
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
## 2017-07-25 NOTE — ED Notes (Addendum)
Pt taken to CT.

## 2017-07-25 NOTE — ED Notes (Signed)
MRI called and stated that they would be unable to get full scan due to patient's fidgeting. Pt is allowing them to scan for a few minutes and MRI is going to get a Limited picture.

## 2017-07-25 NOTE — ED Notes (Signed)
Patient taken to MRI

## 2017-07-25 NOTE — ED Provider Notes (Signed)
Medical screening examination/treatment/procedure(s) were conducted as a shared visit with non-physician practitioner(s) and myself.  I personally evaluated the patient during the encounter.   EKG Interpretation  Date/Time:  Friday July 25 2017 08:59:56 EDT Ventricular Rate:  76 PR Interval:    QRS Duration: 103 QT Interval:  393 QTC Calculation: 442 R Axis:   -25 Text Interpretation:  Accelerated junctional rhythm Borderline left axis deviation Normal sinus rhythm similar to previous EKG  Confirmed by Crista Curb [phone]) on 07/25/2017 12:18:71 PM      82 year old female who presents with difficulty walking. History of DM, HTN, HLD, mild cognitive impairment. Presents form a group home with difficulty walking. Per medical aide, patient reportedly  Had difficulty walking yesterday. Normally ambulatory with walker, and today was unable to get out from wheelchair. No confusion, fever, fall or trauma, nausea, vomiting, headache, back pain.   Mental status baseline per accompanying medical aid today. Vitals stable. Very difficulty neurological exam, but able to move all extremities symmetrically, sensation to light touch in tact throughout, no facial droop. Unable to get up to ambulate, leans backwards and falls into bed whenever we try to get her to stand with assistance. CT head visualized and w/o acute intracranial processes. No evidence of UTI. No major electrolyte or metabolic derangements. MRI brain performed to rule out cerebellar process. MRI shows probable punctate cerebellar stroke. Neurology to be consulted and anticipate admission for CVA w/u.   Lavera Guise, MD 07/25/17 [phone]

## 2017-07-25 NOTE — Progress Notes (Addendum)
ANTICOAGULATION CONSULT NOTE - Initial Consult  Pharmacy Consult for rivaroxaban Indication: VTE treatment  Allergies  Allergen Reactions  . Levofloxacin Other (See Comments)    unknown    Patient Measurements: Height: _0  (172.7 cm) Weight: 134 lb (60.8 kg) IBW/kg (Calculated) : 63.9   Vital Signs: Temp: 98 F (36.7 C) (09/28 1634) Temp Source: Oral (09/28 0901) BP: 125/97 (09/28 1615) Pulse Rate: 81 (09/28 1615)  Labs:  Recent Labs  07/25/17 1015  HGB 10.2*  HCT 32.3*  PLT 130*  CREATININE 1.53*    Estimated Creatinine Clearance: 28.1 mL/min (A) (by C-G formula based on SCr of 1.53 mg/dL (H)).   Medical History: Past Medical History:  Diagnosis Date  . Allergy   . Anemia of chronic disease 10/2010   stable as of 2015, on iron therapy for mild iron deficiency, chronic kidney disease, anemia of chronic disease; Dr. Ralene Ok prior consult  . Chronic kidney disease   . Constipation    mild intermittent  . Depression    [facility] - NP Eugenie Norrie  . Diabetes mellitus   . Diabetes type 2, controlled ([place])   . Edentulous   . Falls   . GERD (gastroesophageal reflux disease)   . H/O cardiovascular stress test 06/2014   nuclear stress test normal; Dr. Wyatt Haste  . H/O echocardiogram 06/17/2014   mild LVH, EF 60-65%, no wall motion abnormalities // Echo 11/17: EF 55-60, normal wall motion, grade 2 diastolic dysfunction, MAC  . H/O mammogram 08/04/2014   normal  . History of MRI of brain and brain stem 11/2010   normal MRI of brain  . Hx of deep venous thrombosis    lifelong anticoagulant therapy  . Hyperlipidemia   . Hypertension   . Hypertrophic obstructive cardiomyopathy (HOCM) ([place]) 2015   normal echo and stress test other than mild LVH 06/2014; Dr. Dorris Carnes  . Long term current use of anticoagulant therapy    due to hx/o recurrent DVT  . Mild mental retardation   . Moderate persistent asthma    asthma and bronchiectasis, pulm consult  2015; unable to do PFTs, 2015  . Mood disorder ([place])    [facility] - NP Eugenie Norrie  . Osteoarthritis of both knees    Dr. Frederik Pear  . Osteopenia 2013   improvements on Boniva and Ca+D from 2011-2013.  2013 Bone Density normal /improved; repeat Bone Density scan 2016  . PVD (peripheral vascular disease) ([place])   . Shortness of breath    06/2014 cardiac consult, SOB seems to be related to poor technique with handheld inhaler, switched to nebs with much improvement  . Thrombocytopenia ([place]) 10/2010   due to medications and immune dysregulation likely, stable as of 2015; no further investigation; hematology, Dr. Ralene Ok  . Tremor 2015   consult with Dr. Netta Neat Neurology.  Parkinsonian tremor without Parkinsons     Assessment: 82 yo female on Xarelto for VTE treatment, currently on 20 mg po daily. Scr is up right now, SCr 1.5, eCrCl 25-30 ml/min. Xarelto is technically not recommended for use in VTE patients when the CrCl falls below 30. On CT scan it was noted that patint has multiple rib fractures due to falls, pleural effusion also noted. Discussed with NP, will hold anticoagulation while fall risk is being assessed and potential hemothorax is being ruled out.   Goal of Therapy:  Monitor platelets by anticoagulation protocol: Yes   Plan:  -Hold anticoagulation -Will need  to reassess specific agent if long-term anticoagulation is warranted -At this point with age, weight and SCr, warfarin may have to be considered  Harvel Quale 07/25/2017,5:12 PM

## 2017-07-25 NOTE — ED Notes (Signed)
NT attempted to walk patient and patient refused to walk. She would not grab on to the walker and pull up.

## 2017-07-25 NOTE — ED Triage Notes (Signed)
Per [facility], Pt is coming from [facility] a Group Home where they noted patient to have some intermittent trouble ambulating along with leaning to the right that started yesterday at noon. Pt is normally in a wheelchair, but does do some ambulation. Hx of Special needs and tremors at baseline. Denies any pain.

## 2017-07-26 ENCOUNTER — Inpatient Hospital Stay (HOSPITAL_COMMUNITY): Payer: Medicare Other

## 2017-07-26 DIAGNOSIS — D649 Anemia, unspecified: Secondary | ICD-10-CM

## 2017-07-26 DIAGNOSIS — N183 Chronic kidney disease, stage 3 (moderate): Secondary | ICD-10-CM

## 2017-07-26 DIAGNOSIS — J45909 Unspecified asthma, uncomplicated: Secondary | ICD-10-CM

## 2017-07-26 DIAGNOSIS — R259 Unspecified abnormal involuntary movements: Secondary | ICD-10-CM

## 2017-07-26 DIAGNOSIS — F89 Unspecified disorder of psychological development: Secondary | ICD-10-CM

## 2017-07-26 DIAGNOSIS — S2241XA Multiple fractures of ribs, right side, initial encounter for closed fracture: Secondary | ICD-10-CM

## 2017-07-26 DIAGNOSIS — I639 Cerebral infarction, unspecified: Secondary | ICD-10-CM

## 2017-07-26 LAB — LIPID PANEL
Cholesterol: 125 mg/dL (ref 0–200)
HDL: 46 mg/dL (ref >40)
LDL CALC: 66 mg/dL (ref 0–99)
TRIGLYCERIDES: 64 mg/dL (ref <150)
Total CHOL/HDL Ratio: 2.7 RATIO
VLDL: 13 mg/dL (ref 0–40)

## 2017-07-26 LAB — CBC
HCT: 32.5 % — ABNORMAL LOW (ref 36.0–46.0)
Hemoglobin: 10.5 g/dL — ABNORMAL LOW (ref 12.0–15.0)
MCH: 30.5 pg (ref 26.0–34.0)
MCHC: 32.3 g/dL (ref 30.0–36.0)
MCV: 94.5 fL (ref 78.0–100.0)
PLATELETS: 126 10*3/uL — AB (ref 150–400)
RBC: 3.44 MIL/uL — AB (ref 3.87–5.11)
RDW: 12.7 % (ref 11.5–15.5)
WBC: 4.4 10*3/uL (ref 4.0–10.5)

## 2017-07-26 LAB — PROTIME-INR
INR: 1.06
PROTHROMBIN TIME: 13.7 s (ref 11.4–15.2)

## 2017-07-26 LAB — HEMOGLOBIN A1C
Hgb A1c MFr Bld: 6 % — ABNORMAL HIGH (ref 4.8–5.6)
Mean Plasma Glucose: 125.5 mg/dL

## 2017-07-26 MED ORDER — LAMOTRIGINE 25 MG PO TABS
25.0000 mg | ORAL_TABLET | Freq: Every day | ORAL | Status: DC
  Administered 2017-07-26 – 2017-07-29 (×4): 25 mg via ORAL
  Filled 2017-07-26 (×4): qty 1

## 2017-07-26 MED ORDER — ENOXAPARIN SODIUM 60 MG/0.6ML ~~LOC~~ SOLN
60.0000 mg | SUBCUTANEOUS | Status: DC
  Administered 2017-07-26 – 2017-07-29 (×4): 60 mg via SUBCUTANEOUS
  Filled 2017-07-26 (×6): qty 0.6

## 2017-07-26 MED ORDER — DIVALPROEX SODIUM ER 500 MG PO TB24
500.0000 mg | ORAL_TABLET | Freq: Every day | ORAL | Status: DC
  Administered 2017-07-27 – 2017-07-29 (×3): 500 mg via ORAL
  Filled 2017-07-26 (×3): qty 1

## 2017-07-26 MED ORDER — WARFARIN - PHARMACIST DOSING INPATIENT
Freq: Every day | Status: DC
  Administered 2017-07-26 – 2017-07-28 (×3)

## 2017-07-26 MED ORDER — ATORVASTATIN CALCIUM 40 MG PO TABS
40.0000 mg | ORAL_TABLET | Freq: Every day | ORAL | Status: DC
  Administered 2017-07-26 – 2017-07-28 (×3): 40 mg via ORAL
  Filled 2017-07-26 (×3): qty 1

## 2017-07-26 MED ORDER — WARFARIN SODIUM 5 MG PO TABS
5.0000 mg | ORAL_TABLET | Freq: Once | ORAL | Status: AC
  Administered 2017-07-26: 5 mg via ORAL
  Filled 2017-07-26: qty 1

## 2017-07-26 NOTE — Progress Notes (Signed)
Subjective/Chief Complaint: No complaints   Objective: Vital signs in last 24 hours: Temp:  [97.8 F (36.6 C)-98.7 F (37.1 C)] 98.7 F (37.1 C) (09/29 0700) Pulse Rate:  [66-83] 79 (09/29 0700) Resp:  [13-31] 18 (09/29 0700) BP: (116-169)/(49-121) 121/49 (09/29 0700) SpO2:  [77 %-100 %] 77 % (09/29 0700) Weight:  [60.8 kg (134 lb)] 60.8 kg (134 lb) (09/28 0901)    Intake/Output from previous day: 09/28 0701 - 09/29 0700 In: 120 [P.O.:120] Out: -  Intake/Output this shift: No intake/output data recorded.  General appearance: alert, cooperative and has tremors Resp: clear to auscultation bilaterally Cardio: regular rate and rhythm GI: soft, non-tender; bowel sounds normal; no masses,  no organomegaly  Lab Results:   Recent Labs  07/25/17 1015  WBC 6.2  HGB 10.2*  HCT 32.3*  PLT 130*   BMET  Recent Labs  07/25/17 1015  NA 141  K 4.4  CL 108  CO2 27  GLUCOSE 121*  BUN 30*  CREATININE 1.53*  CALCIUM 8.8*   PT/INR No results for input(s): LABPROT, INR in the last 72 hours. ABG No results for input(s): PHART, HCO3 in the last 72 hours.  Invalid input(s): PCO2, PO2  Studies/Results: Dg Chest 2 View  Result Date: 07/25/2017 CLINICAL DATA:  Weakness.  Ex-smoker. EXAM: CHEST  2 VIEW COMPARISON:  05/09/2017. FINDINGS: Interval significantly displaced fractures of the right third, fourth, fifth, sixth and seventh ribs posteriorly. Moderate-sized right pleural effusion and minimal left pleural effusion. Mild right basilar atelectasis and minimal left basilar atelectasis. Diffuse peribronchial thickening without significant change. No pneumothorax. Thoracic spine degenerative changes. IMPRESSION: 1. Interval displaced right third through seventh rib fractures. 2. Moderate-sized right pleural effusion and minimal left pleural effusion. 3. Mild right basilar atelectasis and minimal left basilar atelectasis. 4. Stable chronic bronchitic changes. Electronically  Signed   By: Beckie Salts M.D.   On: 07/25/2017 10:12   Ct Head Wo Contrast  Result Date: 07/25/2017 CLINICAL DATA:  Difficulty with ambulation.  Tremors. EXAM: CT HEAD WITHOUT CONTRAST TECHNIQUE: Contiguous axial images were obtained from the base of the skull through the vertex without intravenous contrast. COMPARISON:  May 20, 2016 FINDINGS: Brain: Mild diffuse atrophy is stable. There is no intracranial mass, hemorrhage, extra-axial fluid collection, or midline shift. There is patchy small vessel disease in the centra semiovale bilaterally. Elsewhere gray-white compartments are normal. No evident acute infarct. Vascular: No hyperdense vessel evident. There is calcification in each cavernous carotid artery region. Skull: Bony calvarium appears intact. Sinuses/Orbits: There is mucosal thickening in several ethmoid air cells. Other visualized paranasal sinuses are clear. Orbits appear symmetric bilaterally. Patient has had cataract removal bilaterally. Other: Postoperative change noted in the left mastoid region. Aerated mastoid air cells bilaterally are clear. There is debris in each external auditory canal. IMPRESSION: 1. Mild atrophy with patchy periventricular small vessel disease, stable. No intracranial mass, hemorrhage, or extra-axial fluid collection. No evident acute infarct. 2.  Mild arterial vascular calcification noted. 3. Postoperative change left mastoid region. Aerated mastoid air cells are clear. 4.  Mucosal thickening in several ethmoid air cells. 5.  Probable cerumen in each external auditory canal. Electronically Signed   By: Bretta Bang III M.D.   On: 07/25/2017 10:09   Ct Chest Wo Contrast  Result Date: 07/25/2017 CLINICAL DATA:  Multiple right rib fractures falling a fall with a moderate-sized right pleural effusion. The patient is anticoagulated. Clinical concern for pneumothorax. EXAM: CT CHEST WITHOUT CONTRAST TECHNIQUE: Multidetector CT imaging  of the chest was performed  following the standard protocol without IV contrast. COMPARISON:  Chest radiographs obtained earlier today. FINDINGS: Cardiovascular: Atheromatous calcifications, including the coronary arteries and aorta. Normal sized heart. Minimal pericardial effusion with a maximum thickness of 4 mm. Mediastinum/Nodes: The right lobe of the thyroid gland is enlarged and extending posteriorly to the level of the spine. There is also a 9.5 mm nodule in the right lobe of the thyroid gland as well is a small or nodule. No enlarged lymph nodes. Lungs/Pleura: Moderately large right pleural effusion measuring 10 Hounsfield units in density. Minimal left pleural fluid. Compressive atelectasis of the right lower lobe. There is also dependent atelectasis in the left lower lobe and inferior aspect of the lingula. There is also mild dependent atelectasis in the right upper lobe. Small amount of patchy opacity in the medial aspect of the left upper lobe, anteriorly. No pneumothorax. Upper Abdomen: Abdominal aortic calcifications. Musculoskeletal: Healing fracture of the distal right scapula, best demonstrated on the coronal images. Displaced right posterior third, fourth, fifth, sixth, seventh, eighth, ninth, tenth, eleventh rib fractures with partial bridging callus formation. There are also right third, fourth, fifth, sixth, seventh posterolateral rib fractures with partially bridging callus. There are additional old, healed bilateral rib fractures. No vertebral fractures or subluxations are seen. Thoracic spine degenerative changes are noted. IMPRESSION: 1. Subacute right third through eleventh rib fractures with partial bridging callus formation. 2. Additional old, healed bilateral rib fractures. 3. No acute fractures seen today. 4. Subacute, healing distal right scapula fracture. 5. Moderately large, simple right pleural effusion. This does not have features of an acute hemothorax. 6. Minimal left pleural effusion. 7. Bilateral  atelectasis. 8. Small amount of patchy atelectasis or pneumonia in the anteromedial aspect of the left upper lobe. 9. 9.5 mm and smaller right lobe thyroid nodules. These are too small to characterize, but most likely benign in the absence of known clinical risk factors for thyroid carcinoma. 10.  Calcific coronary artery and aortic atherosclerosis. 11. Minimal pericardial effusion. Aortic Atherosclerosis (ICD10-I70.0). Electronically Signed   By: Beckie Salts M.D.   On: 07/25/2017 17:32   Mr Brain Wo Contrast  Result Date: 07/25/2017 CLINICAL DATA:  Ataxia.  Difficulty walking. EXAM: MRI HEAD WITHOUT CONTRAST TECHNIQUE: Multiplanar, multiecho pulse sequences of the brain and surrounding structures were obtained without intravenous contrast. COMPARISON:  Head CT 07/25/2017 FINDINGS: The examination had to be discontinued prior to completion due to claustrophobia despite receiving medication. Axial coronal diffusion, axial T2, and axial FLAIR sequences were obtained and are mildly motion degraded. Brain: There is a 2 mm focus of hyperintense signal in the superior right cerebellum on the axial diffusion sequence with corresponding reduced ADC, not confirmed on the coronal diffusion sequences though evaluation is limited by its small size and slice selection. There is no evidence of acute infarct elsewhere. No intracranial hemorrhage, mass, midline shift, or extra-axial fluid collection is identified. There is mild generalized cerebral atrophy for age. Periventricular white matter T2 hyperintensity is nonspecific but compatible with minimal chronic small vessel ischemic disease, not greater than expected for age. Vascular: Major intracranial vascular flow voids are preserved. Skull and upper cervical spine: No gross osseous lesion. Sinuses/Orbits: Bilateral cataract extraction. Paranasal sinuses and mastoid air cells are clear. Trace fluid in the new nasopharynx. Other: None. IMPRESSION: 1. Motion degraded,  incomplete examination. 2. Suspected punctate acute infarct in the right cerebellum. Electronically Signed   By: Sebastian Ache M.D.   On: 07/25/2017 15:22  Anti-infectives: Anti-infectives    None      Assessment/Plan: s/p * No surgery found * Advance diet  CVA per Neurology. Carotid u/s, echo, swallow eval. Continue xarelto Rib fx. Pain control and IS  LOS: 1 day    TOTH III,PAUL S 07/26/2017

## 2017-07-26 NOTE — Evaluation (Signed)
Physical Therapy Evaluation Patient Details Name: SHANYLA REILEY MRN: [id number] DOB: July 06, 1935 Today's Date: 07/26/2017   History of Present Illness  Pt is an 82 y.o. female with PMH significant for developmental disability/chronic cognitive dysfunction and chronic tremors. She was brought to the ED with difficulty ambulating and leaning toward the R side at her group home. She has had recent falls. Found to have multiple rib fractures, R sided pleural effusion. 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  Clinical Impression  Pt presented sitting OOB in recliner chair, awake and willing to participate in therapy session. Pt's caregiver from her group home was present during session as well. Pt unable to provide any reliable information secondary to cognitive deficits at baseline. Pt currently requires mod A x2 for sit<>stand transfers with use of RW and mod-max A for static standing. Pt has had multiple falls and remains at a high fall risk at this time. Pt would continue to benefit from skilled physical therapy services at this time while admitted and after d/c to address the below listed limitations in order to improve overall safety and independence with functional mobility.      Follow Up Recommendations SNF    Equipment Recommendations  None recommended by PT    Recommendations for Other Services       Precautions / Restrictions Precautions Precautions: Fall Restrictions Weight Bearing Restrictions: No      Mobility  Bed Mobility Overal bed mobility: Needs Assistance Bed Mobility: Supine to Sit     Supine to sit: Mod assist     General bed mobility comments: pt OOB in recliner chair upon arrival  Transfers Overall transfer level: Needs assistance Equipment used: Rolling walker (2 wheeled) Transfers: Sit to/from Stand Sit to Stand: Mod assist;+2 physical assistance Stand pivot transfers: Max assist       General transfer comment: increased time, cueing for  sequencing/technique, assist of two to rise into standing from recliner chair  Ambulation/Gait                Stairs            Wheelchair Mobility    Modified Rankin (Stroke Patients Only) Modified Rankin (Stroke Patients Only) Pre-Morbid Rankin Score: Severe disability Modified Rankin: Severe disability     Balance Overall balance assessment: Needs assistance;History of Falls Sitting-balance support: Feet supported Sitting balance-Leahy Scale: Poor Sitting balance - Comments: Up to min assist for balance initially but then able to sit with min guard after approximately 1 minute.  Postural control: Posterior lean Standing balance support: Bilateral upper extremity supported;During functional activity Standing balance-Leahy Scale: Poor Standing balance comment: reliant on bilateral UEs on RW and mod-max A for static standing with R sided lean                             Pertinent Vitals/Pain Pain Assessment: No/denies pain    Home Living Family/patient expects to be discharged to:: Skilled nursing facility                 Additional Comments: Pt currently resides at group home.     Prior Function Level of Independence: Needs assistance   Gait / Transfers Assistance Needed: Per sitter from group home, pt ambulates with RW and utilizes w/c at times.   ADL's / Homemaking Assistance Needed: Per sitter, pt feeds herself, dresses herself, and bathes with supervision. When asked pt, she reports that staff assists her.  Hand Dominance   Dominant Hand: Right    Extremity/Trunk Assessment   Upper Extremity Assessment Upper Extremity Assessment: Defer to OT evaluation RUE Deficits / Details: Generally weak with tremors.  RUE Coordination: decreased fine motor;decreased gross motor LUE Deficits / Details: Generally weak with tremors.  LUE Coordination: decreased fine motor;decreased gross motor    Lower Extremity Assessment Lower  Extremity Assessment: Generalized weakness    Cervical / Trunk Assessment Cervical / Trunk Assessment: Kyphotic  Communication   Communication: Expressive difficulties  Cognition Arousal/Alertness: Awake/alert Behavior During Therapy: WFL for tasks assessed/performed Overall Cognitive Status: History of cognitive impairments - at baseline Area of Impairment: Memory;Following commands;Safety/judgement;Problem solving                     Memory: Decreased short-term memory Following Commands: Follows one step commands with increased time;Follows one step commands inconsistently Safety/Judgement: Decreased awareness of safety;Decreased awareness of deficits   Problem Solving: Slow processing;Decreased initiation;Difficulty sequencing;Requires verbal cues;Requires tactile cues General Comments: pt with hx of dementia at baseline      General Comments General comments (skin integrity, edema, etc.): Sitter/caregiver from group home present and engaged in session.     Exercises     Assessment/Plan    PT Assessment Patient needs continued PT services  PT Problem List Decreased strength;Decreased activity tolerance;Decreased balance;Decreased mobility;Decreased coordination;Decreased cognition;Decreased knowledge of use of DME;Decreased safety awareness;Decreased knowledge of precautions       PT Treatment Interventions DME instruction;Stair training;Gait training;Functional mobility training;Therapeutic activities;Balance training;Neuromuscular re-education;Therapeutic exercise;Cognitive remediation;Patient/family education    PT Goals (Current goals can be found in the Care Plan section)  Acute Rehab PT Goals Patient Stated Goal: none stated PT Goal Formulation: Patient unable to participate in goal setting Time For Goal Achievement: 08/09/17 Potential to Achieve Goals: Fair    Frequency Min 2X/week   Barriers to discharge        Co-evaluation                AM-PAC PT "6 Clicks" Daily Activity  Outcome Measure Difficulty turning over in bed (including adjusting bedclothes, sheets and blankets)?: Unable Difficulty moving from lying on back to sitting on the side of the bed? : Unable Difficulty sitting down on and standing up from a chair with arms (e.g., wheelchair, bedside commode, etc,.)?: Unable Help needed moving to and from a bed to chair (including a wheelchair)?: A Lot Help needed walking in hospital room?: A Lot Help needed climbing 3-5 steps with a railing? : Total 6 Click Score: 8    End of Session Equipment Utilized During Treatment: Gait belt Activity Tolerance: Patient tolerated treatment well Patient left: in chair;with call bell/phone within reach;with chair alarm set;with family/visitor present Nurse Communication: Mobility status PT Visit Diagnosis: Other abnormalities of gait and mobility (R26.89);Repeated falls (R29.6)    Time: 7829-5621 PT Time Calculation (min) (ACUTE ONLY): 10 min   Charges:   PT Evaluation $PT Eval Moderate Complexity: 1 Mod     PT G Codes:        [place], PT, DPT [phone]   Alessandra Bevels Ronan Dion 07/26/2017, 10:16 AM

## 2017-07-26 NOTE — Progress Notes (Addendum)
ANTICOAGULATION CONSULT NOTE - Initial Consult  Pharmacy Consult for Warfarin/Lovenox Bridge Indication: stroke  Allergies  Allergen Reactions  . Levofloxacin Other (See Comments)    unknown    Patient Measurements: Height: _0  (172.7 cm) Weight: 134 lb (60.8 kg) IBW/kg (Calculated) : 63.9  Vital Signs: Temp: 98.4 F (36.9 C) (09/29 0920) Temp Source: Axillary (09/29 0920) BP: 149/83 (09/29 0920) Pulse Rate: 82 (09/29 0920)  Labs:  Recent Labs  07/25/17 1015 07/26/17 0728  HGB 10.2* 10.5*  HCT 32.3* 32.5*  PLT 130* 126*  CREATININE 1.53*  --     Estimated Creatinine Clearance: 28.1 mL/min (A) (by C-G formula based on SCr of 1.53 mg/dL (H)).   Medical History: Past Medical History:  Diagnosis Date  . Allergy   . Anemia of chronic disease 10/2010   stable as of 2015, on iron therapy for mild iron deficiency, chronic kidney disease, anemia of chronic disease; Dr. Ralene Ok prior consult  . Chronic kidney disease   . Constipation    mild intermittent  . Depression    [facility] - NP Eugenie Norrie  . Diabetes mellitus   . Diabetes type 2, controlled ([place])   . Edentulous   . Falls   . GERD (gastroesophageal reflux disease)   . H/O cardiovascular stress test 06/2014   nuclear stress test normal; Dr. Wyatt Haste  . H/O echocardiogram 06/17/2014   mild LVH, EF 60-65%, no wall motion abnormalities // Echo 11/17: EF 55-60, normal wall motion, grade 2 diastolic dysfunction, MAC  . H/O mammogram 08/04/2014   normal  . History of MRI of brain and brain stem 11/2010   normal MRI of brain  . Hx of deep venous thrombosis    lifelong anticoagulant therapy  . Hyperlipidemia   . Hypertension   . Hypertrophic obstructive cardiomyopathy (HOCM) ([place]) 2015   normal echo and stress test other than mild LVH 06/2014; Dr. Dorris Carnes  . Long term current use of anticoagulant therapy    due to hx/o recurrent DVT  . Mild mental retardation   . Moderate persistent  asthma    asthma and bronchiectasis, pulm consult 2015; unable to do PFTs, 2015  . Mood disorder ([place])    [facility] - NP Eugenie Norrie  . Osteoarthritis of both knees    Dr. Frederik Pear  . Osteopenia 2013   improvements on Boniva and Ca+D from 2011-2013.  2013 Bone Density normal /improved; repeat Bone Density scan 2016  . PVD (peripheral vascular disease) ([place])   . Shortness of breath    06/2014 cardiac consult, SOB seems to be related to poor technique with handheld inhaler, switched to nebs with much improvement  . Thrombocytopenia ([place]) 10/2010   due to medications and immune dysregulation likely, stable as of 2015; no further investigation; hematology, Dr. Ralene Ok  . Tremor 2015   consult with Dr. Netta Neat Neurology.  Parkinsonian tremor without Parkinsons    Assessment: 58 yoF presenting with acute cerebellar stroke, on Xarelto PTA for history of DVT. Pharmacy originally consulted to restart Xarelto, but given patient's Scr stable around 1.5-1.6 and current CrCl ~28 mL/min, Xarelto is not recommended for VTE treatment. Would also not recommend Eliquis given her renal function, advanced age, and low body weight. Discussed with Dr. Tana Coast and will start anticoagulation with warfarin and lovenox bridge until therapeutic INR.  Baseline INR 1.06. H&H, pltc low but stable. No bleeding noted.    Goal of Therapy:  INR 2-3 Monitor  platelets by anticoagulation protocol: Yes   Plan:  Give warfarin 65m x1 Start lovenox 634mSQ q24h Daily INR, CBC Monitor for s/sx of bleeding   [name] FractionPharmD PGY1 Pharmacy Resident Pager: [phone]

## 2017-07-26 NOTE — Progress Notes (Signed)
STROKE TEAM PROGRESS NOTE   HISTORY OF PRESENT ILLNESS (per record) [name] is an 82 y.o. female with dementia, tremors, history of DVT on Xarelto, HTN, HLD, DM who lives in a group home who presents to ER with fall.  She was  apparently was found to more difficulty walking since 9/27 and found leaning to right. Patient uses a walker most of the time to get around, but occassionally uses a wheelchair, and she seemed to be having more trouble with her gait yesterday. She was brought to the Er and MRI showed a punctate infarct in the cerebellum. Patient was admitted to hospitalist team and neurology was consulted for stroke workup.  History obtained by chart, patient has no knowledge of why she is in the hospital.    Date last known well: 9.27.18 Time last known well: unclear tPA Given: no, outside window NIHSS 0 Modified Rankin: 0   SUBJECTIVE (INTERVAL HISTORY) No family at bedside, no events overnight   OBJECTIVE Temp:  [97.8 F (36.6 C)-98.7 F (37.1 C)] 98.4 F (36.9 C) (09/29 0920) Pulse Rate:  [66-82] 82 (09/29 0920) Cardiac Rhythm: Normal sinus rhythm;Heart block (09/29 0700) Resp:  [13-31] 19 (09/29 0920) BP: (119-169)/(49-121) 149/83 (09/29 0920) SpO2:  [77 %-100 %] 79 % (09/29 0920)  CBC:   Recent Labs Lab 07/25/17 1015 07/26/17 0728  WBC 6.2 4.4  HGB 10.2* 10.5*  HCT 32.3* 32.5*  MCV 94.7 94.5  PLT 130* 126*    Basic Metabolic Panel:   Recent Labs Lab 07/25/17 1015  NA 141  K 4.4  CL 108  CO2 27  GLUCOSE 121*  BUN 30*  CREATININE 1.53*  CALCIUM 8.8*    Lipid Panel:     Component Value Date/Time   CHOL 125 07/26/2017 0728   TRIG 64 07/26/2017 0728   HDL 46 07/26/2017 0728   CHOLHDL 2.7 07/26/2017 0728   VLDL 13 07/26/2017 0728   LDLCALC 66 07/26/2017 0728   HgbA1c:  Lab Results  Component Value Date   HGBA1C 6.0 (H) 07/26/2017   Urine Drug Screen: No results found for: LABOPIA, COCAINSCRNUR, LABBENZ, AMPHETMU, THCU, LABBARB   Alcohol Level No results found for: [facility]  IMAGING  Dg Chest 2 View 07/25/2017 IMPRESSION:  1. Interval displaced right third through seventh rib fractures.  2. Moderate-sized right pleural effusion and minimal left pleural effusion.  3. Mild right basilar atelectasis and minimal left basilar atelectasis.  4. Stable chronic bronchitic changes.    Ct Head Wo Contrast 07/25/2017 IMPRESSION:  1. Mild atrophy with patchy periventricular small vessel disease, stable. No intracranial mass, hemorrhage, or extra-axial fluid collection. No evident acute infarct.  2.  Mild arterial vascular calcification noted.  3. Postoperative change left mastoid region. Aerated mastoid air cells are clear.  4.  Mucosal thickening in several ethmoid air cells.  5.  Probable cerumen in each external auditory canal.    Ct Chest Wo Contrast 07/25/2017 IMPRESSION:  1. Subacute right third through eleventh rib fractures with partial bridging callus formation.  2. Additional old, healed bilateral rib fractures.  3. No acute fractures seen today.  4. Subacute, healing distal right scapula fracture.  5. Moderately large, simple right pleural effusion. This does not have features of an acute hemothorax.  6. Minimal left pleural effusion.  7. Bilateral atelectasis.  8. Small amount of patchy atelectasis or pneumonia in the anteromedial aspect of the left upper lobe.  9. 9.5 mm and smaller right lobe thyroid nodules. These are  too small to characterize, but most likely benign in the absence of known clinical risk factors for thyroid carcinoma.  10.  Calcific coronary artery and aortic atherosclerosis.  11. Minimal pericardial effusion. Aortic Atherosclerosis (ICD10-I70.0).     Mr Brain Wo Contrast9/28/2018 IMPRESSION:  1. Motion degraded, incomplete examination.  2. Suspected punctate acute infarct in the right cerebellum.    Transthoracic Echocardiogram - pending   Bilateral Carotid  Dopplers 07/26/2017 Bilateral:  1-39% ICA stenosis. Right - Vertebral artery flow is antegrade.  Left vertebral could not be insonated due to constant tremors     PHYSICAL EXAM Vitals:   07/26/17 0300 07/26/17 0515 07/26/17 0700 07/26/17 0920  BP: (!) 122/54 (!) 130/50 (!) 121/49 (!) 149/83  Pulse: 66 67 79 82  Resp: Temp: 98.6 F (37 C) 97.8 F (36.6 C) 98.7 F (37.1 C) 98.4 F (36.9 C)  TempSrc: Axillary Axillary Axillary Axillary  SpO2: 94% 94% (!) 77% (!) 79%  Weight:      Height:       Physical exam: Exam: Gen: NAD,   Neuro: Detailed Neurologic Exam  Speech:    Not aphasic Cognition:    The patient is oriented to person    recent and remote memory impaired;     Cranial Nerves:    The pupils are equal, round, and reactive to light.  Visual fields are full to finger confrontation. Extraocular movements are intact. Trigeminal sensation is intact and the muscles of mastication are normal. The face is symmetric. The palate elevates in the midline. Hearing intact. Voice is normal. Shoulder shrug is normal. The tongue has normal motion without fasciculations.   Coordination:    Bradykinetic with tremors.   Motor Observation:    Resting tremor in hands and mouth    Strength:    Strength is symmetric and anti-gravity in the upper and lower limbs. No deficit noted,      Sensation: intact to LT     Reflex Exam:  DTR's:    Deep tendon reflexes in the upper and lower extremities are hyporeflexic bilaterally.     ASSESSMENT/PLAN [name] is a 82 y.o. female with history of dementia, thrombocytopenia, mild mental retardation, HOCM, CKD,  tremors, history of DVT on Xarelto, HTN, HLD, and DM   presenting with gait difficulties and a fall. She did not receive IV t-PA due to late presentation.  Stroke:  Suspected punctate acute infarct in the right cerebellum. Likely due to small vessel disease.  Resultant  Resolving deficits  CT head - No  evident acute infarct.   MRI head - Suspected punctate acute infarct in the right cerebellum.   MRA head - not performed.  Carotid Doppler - 1-39% ICA stenosis. RT VA is antegrade.  Lt VA could not be insonated due to constant tremors  2D Echo  - pending  LDL - 66  HgbA1c - 6.0  VTE prophylaxis - Lovenox Diet Heart Room service appropriate? Yes; Fluid consistency: Thin  Xarelto (rivaroxaban) daily prior to admission, now on warfarin daily  Ongoing aggressive stroke risk factor management  Therapy recommendations:  SNF recommended  Disposition: Pending  Hypertension  Stable  Permissive hypertension (OK if < 220/120) but gradually normalize in 5-7 days  Long-term BP goal normotensive  Hyperlipidemia  Home meds:  Zocor 20 mg daily prior to admission   LDL 66, goal < 70  Now on Lipitor 40 mg daily  Continue statin at discharge  Diabetes  HgbA1c 6.0, goal < 7.0  Controlled  Other Stroke Risk Factors  Advanced age  Former cigarette smoker - quit   Other Active Problems  Occasional low O2 sat - bilateral pleural effusions  Anemia / thrombocytopenia  Chronic kidney disease - 30 / 1.53 creatinine clearance 28  Multiple fractures  Hospital day # 1  Personally examined patient and images,  history, physical, neuro exam,assessment and plan as stated above.  I have personally obtained the history, evaluated lab date, reviewed imaging studies and agree with radiology interpretations. If echocardiogram is normal patient may be discharged with follow up in neurology with Dr. Marjory Lies.  To contact Stroke Continuity provider, please refer to [email or web address]. After hours, contact General Neurology

## 2017-07-26 NOTE — Evaluation (Signed)
Occupational Therapy Evaluation Patient Details Name: [name] MRN: [id number] DOB: 08/29/1935 Today's Date: 07/26/2017    History of Present Illness Pt is an 82 y.o. female with PMH significant for developmental disability/chronic cognitive dysfunction and chronic tremors. She was brought to the ED with difficulty ambulating and leaning toward the R side at her group home. She has had recent falls. Found to have multiple rib fractures, R sided pleural effusion. 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   Clinical Impression   PTA, pt's caregiver from group home reports that pt was able to complete basic ADL with supervision and ambulation with RW. Per chart, pt with increasing frequency of falling at group home. Pt with significant B UE tremor which caregiver reports is baseline. She currently requires min assist for self-feeding tasks, mod assist for UB ADL, max assist for LB ADL, and max assist for stand-pivot simulated toilet transfers. She presents with decreased balance and coordination as well as generalized weakness impacting her ability to safely participate in ADL at [facility]. Recommended that nursing staff assist with self-feeding tasks to improve safety due to tremors and decreased BUE coordination. Feel she would best benefit from SNF level rehabilitation to maximize independence and safety with ADL and functional mobility. Will continue to follow while admitted.    Follow Up Recommendations  SNF;Supervision/Assistance - 24 hour    Equipment Recommendations  Other (comment) (TBD at next venue of care)    Recommendations for Other Services       Precautions / Restrictions Precautions Precautions: Fall Restrictions Weight Bearing Restrictions: No      Mobility Bed Mobility Overal bed mobility: Needs Assistance Bed Mobility: Supine to Sit     Supine to sit: Mod assist     General bed mobility comments: Mod assist to scoot hips and manage BLE.    Transfers Overall transfer level: Needs assistance Equipment used: Rolling walker (2 wheeled);1 person hand held assist Transfers: Sit to/from [employer] Sit to Stand: Max assist Stand pivot transfers: Max assist       General transfer comment: Attempted transfer to chair with RW and pt able to stand but unable to take pivotal steps despite max assist. With face to face technique, pt was able to transfer to chair with max assist.     Balance Overall balance assessment: Needs assistance Sitting-balance support: No upper extremity supported;Feet supported Sitting balance-Leahy Scale: Poor Sitting balance - Comments: Up to min assist for balance initially but then able to sit with min guard after approximately 1 minute.  Postural control: Posterior lean Standing balance support: Bilateral upper extremity supported;During functional activity Standing balance-Leahy Scale: Poor Standing balance comment: Reliant on B UE support and max assist.                            ADL either performed or assessed with clinical judgement   ADL Overall ADL's : Needs assistance/impaired Eating/Feeding: Minimal assistance;Sitting   Grooming: Minimal assistance;Sitting   Upper Body Bathing: Sitting;Moderate assistance   Lower Body Bathing: Maximal assistance;Sit to/from stand   Upper Body Dressing : Moderate assistance;Sitting   Lower Body Dressing: Maximal assistance;Sit to/from stand   Toilet Transfer: Maximal assistance;Stand-pivot Toilet Transfer Details (indicate cue type and reason): Simulated from bed to chair.  Toileting- Clothing Manipulation and Hygiene: Maximal assistance;Sit to/from stand       Functional mobility during ADLs: Maximal assistance (stand-pivot only) General ADL Comments: Pt with significant posterior lean  when attempting to stand.      Vision Patient Visual Report: No change from baseline Vision Assessment?: No apparent visual  deficits Additional Comments: Will continue to assess.     Perception     Praxis      Pertinent Vitals/Pain Pain Assessment: No/denies pain     Hand Dominance Right   Extremity/Trunk Assessment Upper Extremity Assessment Upper Extremity Assessment: RUE deficits/detail;LUE deficits/detail RUE Deficits / Details: Generally weak with tremors.  RUE Coordination: decreased fine motor;decreased gross motor LUE Deficits / Details: Generally weak with tremors.  LUE Coordination: decreased fine motor;decreased gross motor   Lower Extremity Assessment Lower Extremity Assessment: Defer to PT evaluation       Communication Communication Communication: Expressive difficulties   Cognition Arousal/Alertness: Awake/alert Behavior During Therapy: WFL for tasks assessed/performed Overall Cognitive Status: History of cognitive impairments - at baseline                                 General Comments: Per sitter from group home, pt at baseline cognitively. She was able to follow simple commands appropriately.    General Comments  Sitter/caregiver from group home present and engaged in session.     Exercises     Shoulder Instructions      Home Living Family/patient expects to be discharged to:: Skilled nursing facility                                 Additional Comments: Pt currently resides at group home.       Prior Functioning/Environment Level of Independence: Needs assistance  Gait / Transfers Assistance Needed: Per sitter from group home, pt ambulates with RW. She additionally utilizes w/c at times.  ADL's / Homemaking Assistance Needed: Per sitter, pt feeds herself, dresses herself, and bathes with supervision. When asked pt, she reports that staff assists her.             OT Problem List: Decreased strength;Decreased range of motion;Decreased activity tolerance;Impaired balance (sitting and/or standing);Decreased cognition;Decreased  coordination;Decreased safety awareness;Decreased knowledge of use of DME or AE;Decreased knowledge of precautions;Impaired UE functional use      OT Treatment/Interventions: Self-care/ADL training;Therapeutic exercise;Energy conservation;DME and/or AE instruction;Therapeutic activities;Patient/family education;Balance training;Cognitive remediation/compensation    OT Goals(Current goals can be found in the care plan section) Acute Rehab OT Goals Patient Stated Goal: Per caregiver to eat breakfast OT Goal Formulation: With patient Time For Goal Achievement: 08/09/17 Potential to Achieve Goals: Good ADL Goals Pt Will Perform Eating: with supervision;sitting Pt Will Perform Grooming: with supervision;sitting Pt Will Perform Upper Body Dressing: with min guard assist;sitting Pt Will Perform Lower Body Dressing: with min assist Pt Will Transfer to Toilet: with min assist;stand pivot transfer;bedside commode (with RW) Pt Will Perform Toileting - Clothing Manipulation and hygiene: with min guard assist;sit to/from stand  OT Frequency: Min 2X/week   Barriers to D/C:            Co-evaluation              AM-PAC PT "6 Clicks" Daily Activity     Outcome Measure Help from another person eating meals?: A Little Help from another person taking care of personal grooming?: A Little Help from another person toileting, which includes using toliet, bedpan, or urinal?: A Lot Help from another person bathing (including washing, rinsing, drying)?: A Lot Help from another person  to put on and taking off regular upper body clothing?: A Lot Help from another person to put on and taking off regular lower body clothing?: A Lot 6 Click Score: 14   End of Session Equipment Utilized During Treatment: Gait belt;Rolling walker Nurse Communication: Mobility status  Activity Tolerance: Patient tolerated treatment well Patient left: in chair;with call bell/phone within reach (with sitter from group home  present)  OT Visit Diagnosis: Unsteadiness on feet (R26.81);History of falling (Z91.81);Muscle weakness (generalized) (M62.81);Other symptoms and signs involving cognitive function                Time: 2956-2130 OT Time Calculation (min): 20 min Charges:  OT General Charges $OT Visit: 1 Visit OT Evaluation $OT Eval Moderate Complexity: 1 Mod G-Codes:     Doristine Section, MS OTR/L  Pager: [phone]   Philomina Leon A Aniqa Hare 07/26/2017, 9:43 AM

## 2017-07-26 NOTE — Clinical Social Work Note (Signed)
Clinical Social Work Assessment  Patient Details  Name: [name] MRN: [id number] Date of Birth: [birth year]  Date of referral:  07/26/17               Reason for consult:  Discharge Planning, Facility Placement                Permission sought to share information with:  Family Supports Permission granted to share information::  Yes, Verbal Permission Granted  Name::     Indea Dearman  Agency::  snf  Relationship::  sister in Sports coach and guardian   Contact Information:  [phone]  Housing/Transportation Living arrangements for the past 2 months:  Group Home Source of Information:  Guardian, Friend/Neighbor Patient Interpreter Needed:  None Criminal Activity/Legal Involvement Pertinent to Current Situation/Hospitalization:  No - Comment as needed Significant Relationships:  Other Family Members, Siblings, Friend Lives with:  Other (Comment) (group home) Do you feel safe going back to the place where you live?  No Need for family participation in patient care:  Yes (Comment)  Care giving concerns:  Patient had sister  And friend at bedside. Patient has support from family members and friends from previous group home  Social Worker assessment / plan:  Clinical Social Worker met patient at bedside with family present. CSW did assessment with sister in law Witches Treyton Slimp and friend (debra) due to patient being unable to participate in assessment. [name] stated she is agreeable for patient to discharge to SNF for short term rehab. [name] stated patient has been failing at group home recently and she does not feel like the group home([facility]) is able to take care of her sister. [name] stated she would also like to look into long term facility for patient. CSW will follow up with family once facility has made offers  Employment status:  Retired Forensic scientist:  Medicare PT Recommendations:  [place] / Referral to community resources:  [place]  Patient/Family's Response to care: Family is verbalized appreciation for CSW role in patients care. Family stated they are happy that CSW will be getting information on long term facility  Patient/Family's Understanding of and Emotional Response to Diagnosis, Current Treatment, and Prognosis:  Family agreeable for SNF placement. Family understand patients recent hospitalization and current limitations  Emotional Assessment Appearance:  Appears stated age Attitude/Demeanor/Rapport:  Other Affect (typically observed):  Pleasant Orientation:  Oriented to Place, Oriented to Self Alcohol / Substance use:  Other Psych involvement (Current and /or in the community):  No (Comment)  Discharge Needs  Concerns to be addressed:  No discharge needs identified, Other (Comment Required (family was concerned about pt returning to group home due to constant fall) Readmission within the last 30 days:  No Current discharge risk:  None Barriers to Discharge:  No Barriers Identified   Wende Neighbors, LCSW 07/26/2017, 1:58 PM

## 2017-07-26 NOTE — Progress Notes (Signed)
VASCULAR LAB PRELIMINARY  PRELIMINARY  PRELIMINARY  PRELIMINARY  Carotid duplex completed.    Preliminary report:  Bilateral:  1-39% ICA stenosis. Right - Vertebral artery flow is antegrade.  Left vertebral could not be insonated due to constant tremors   Kailene Steinhart, RVS 07/26/2017, 10:36 AM

## 2017-07-26 NOTE — Progress Notes (Signed)
[facility]                                                                              Patient Demographics  [name], is a 82 y.o. female, DOB - Apr 21, 1935, WJX:[id number]  Admit date - 07/25/2017   Admitting Physician Delano Metz, MD  Outpatient Primary MD for the patient is Tysinger, Kermit Balo, PA-C  Outpatient specialists:   LOS - 1  days   Medical records reviewed and are as summarized below:    Chief Complaint  Patient presents with  . Weakness       Brief summary   Per admit note by Dr. Arta Silence on 9/28 [name] is a 82 y.o. female with medical history significant for developmental disability, chronic anemia, history of recurrent DVT on chronic anticoagulation, asthma, diabetes, hypertension, dyslipidemia, HOCM and recent issues with recurrent falls. Group home reported the patient has had intermittent difficulty with ambulating and leaning to the right starting at noon on 9/27. Despite multiple attempts, patient unable to mobilize even with assistance in the ER and was very reluctant to get out of her wheelchair. According to her sister who is at the bedside (and who is also her legal guardian) patient has been having frequent falls at the facility. MR brain revealed suspected punctate acute infarct in the right cerebellum   Assessment & Plan    Principal Problem:  acute Cerebellar stroke [facility]) - patient sent from the group home with having difficulty ambulating, preferential right side positioning. MRI of the brain with suspected punctate acute infarcts in the right cerebellum - carotid Dopplers showed 1-39% ICA stenosis - PT evaluation recommended skilled nursing facility - neurology was consulted and do not strongly feel that it is responsible for patient's symptoms and possibly an incidental finding, patient is already on xarelto. 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 - Neurology recommended to consider weaning Depakote and replacing with lamotrigine - per neurology recommendations, Depakote decreased to 500 mg daily ( previously was on 500 mg twice a day), added lamotrigine 25 mg daily, will need to be increased after Depakote is weaned off in one week     Frequent falls - PT evaluation recommended skilled nursing facility  anemia of chronic disease - normocytic, chronic, currently at baseline    Hyperlipidemia - LDL 66, continue statin    History of recurrent deep vein thrombosis (DVT) Documented as on lifelong anticoagulation - Continue xarelto with renal dosing    Closed traumatic displaced fracture of five ribs of right side - chest x-ray showed incidental finding of multiple rib fractures on the right 3-7, displaced with moderate right sided pleural effusion, no pneumothorax - patient has been seen by trauma surgery, recommended to continue xarelto, no surgery - PT evaluation recommended skilled nursing facility    Hypertrophic obstructive cardiomyopathy (HOCM) (HCC) - reported in the past medical history, last 2-D echo in 2017 with grade 2 diastolic dysfunction - Follow 2-D echo  Type 2 diabetes mellitus with stage 3 chronic kidney disease (HCC) - continue sliding scale insulin, follow hemoglobin A1c    Developmental disability  - Currently in group home however now recommended skilled nursing facility for more supervision, rehab    Asthma - currently stable, continue nebulizers,    HTN (hypertension) - Currently stable, will restart Lopressor 12.5 mg twice a day    Pleural effusion on right - currently stable, patient was on Lasix 20 mg daily prior to admission, hold off today - No shortness of breath    CKD (chronic kidney disease), stage III (HCC) - Baseline creatinine 1.6-1.7, currently stable at  baseline  Code Status: full CODE STATUS DVT Prophylaxis:  Restart xarelto Family Communication: Discussed in detail with the patient, all imaging results, lab results explained to the patient , caregiver at the bedside  Disposition Plan: will need skilled nursing facility  Time Spent in minutes  35 minutes  Procedures:  MRI brain  Consultants:   neurology  Antimicrobials:      Medications  Scheduled Meds: . aspirin  300 mg Rectal Daily   Or  . aspirin  325 mg Oral Daily  . budesonide  0.5 mg Nebulization BID  . buPROPion  150 mg Oral BID  . divalproex  500 mg Oral BID  . FLUoxetine  40 mg Oral Daily  . fluticasone  2 spray Each Nare Daily  . LORazepam  0.5 mg Oral TID  . montelukast  10 mg Oral QHS  . pantoprazole  40 mg Oral Daily  . simvastatin  20 mg Oral q1800   Continuous Infusions: PRN Meds:.acetaminophen **OR** acetaminophen (TYLENOL) oral liquid 160 mg/5 mL **OR** acetaminophen, albuterol, morphine injection   Antibiotics   Anti-infectives    None        Subjective:   [name] was seen and examined today.   Patient denies dizziness, chest pain, shortness of breath, abdominal pain, N/V/D/C, new weakness, numbess, tingling. No acute events overnight. caregiver at the bedside. Has significant resting tremorsof the extremities, per the caregiver this is baseline  Objective:   Vitals:   07/26/17 0300 07/26/17 0515 07/26/17 0700 07/26/17 0920  BP: (!) 122/54 (!) 130/50 (!) 121/49 (!) 149/83  Pulse: 66 67 79 82  Resp: 18 14 18 19   Temp: 98.6 F (37 C) 97.8 F (36.6 C) 98.7 F (37.1 C) 98.4 F (36.9 C)  TempSrc: Axillary Axillary Axillary Axillary  SpO2: 94% 94% (!) 77% (!) 79%  Weight:      Height:        Intake/Output Summary (Last 24 hours) at 07/26/17 1041 Last data filed at 07/25/17 2130  Gross per 24 hour  Intake              120 ml  Output                0 ml  Net              120 ml     Wt Readings from Last 3 Encounters:    07/25/17 60.8 kg (134 lb)  07/01/17 60.8 kg (134 lb)  05/09/17 62.9 kg (138 lb 9.6 oz)     Exam  General: Alert and oriented x self, resting tremors but that is her baseline comfortable  Eyes:  HEENT:  Atraumatic, normocephalic  Cardiovascular: S1 S2 auscultated, no rubs, murmurs or gallops. Regular rate and rhythm.  Respiratory: Clear to auscultation bilaterally, no wheezing, rales or rhonchi  Gastrointestinal: Soft, nontender,  nondistended, + bowel sounds  Ext: no pedal edema bilaterally  Neuro: AAOx3, Cr N's II- XII. Strength 5/5 upper and lower extremities bilaterally  Musculoskeletal: No digital cyanosis, clubbing  Skin: No rashes  Psych: mental status at baseline, alert and oriented x1   Data Reviewed:  I have personally reviewed following labs and imaging studies  Micro Results No results found for this or any previous visit (from the past 240 hour(s)).  Radiology Reports Dg Chest 2 View  Result Date: 07/25/2017 CLINICAL DATA:  Weakness.  Ex-smoker. EXAM: CHEST  2 VIEW COMPARISON:  05/09/2017. FINDINGS: Interval significantly displaced fractures of the right third, fourth, fifth, sixth and seventh ribs posteriorly. Moderate-sized right pleural effusion and minimal left pleural effusion. Mild right basilar atelectasis and minimal left basilar atelectasis. Diffuse peribronchial thickening without significant change. No pneumothorax. Thoracic spine degenerative changes. IMPRESSION: 1. Interval displaced right third through seventh rib fractures. 2. Moderate-sized right pleural effusion and minimal left pleural effusion. 3. Mild right basilar atelectasis and minimal left basilar atelectasis. 4. Stable chronic bronchitic changes. Electronically Signed   By: Beckie Salts M.D.   On: 07/25/2017 10:12   Dg Lumbar Spine Complete  Result Date: 07/03/2017 CLINICAL DATA:  Larey Seat 2 weeks ago, low back pain since that time EXAM: LUMBAR SPINE - COMPLETE 4+ VIEW COMPARISON:  Lumbar  spine films of 08/25/2013 FINDINGS: There is no change alignment of the lumbar vertebrae with mild retrolistheses of L3 on L4, L4 on L5, and L5 on S1, as noted previously. Degenerative disc disease is noted diffusely. No interval compression deformity is seen. Abdominal aortic atherosclerosis is noted. The SI joints appear corticated. IMPRESSION: 1. No acute compression deformity. 2. No change in diffuse degenerative disc disease and mild retrolistheses as noted above. Electronically Signed   By: Dwyane Dee M.D.   On: 07/03/2017 08:16   Dg Pelvis 1-2 Views  Result Date: 07/03/2017 CLINICAL DATA:  Status post fall 2 weeks ago with generalize low back and pelvic pain. EXAM: PELVIS - 1-2 VIEW COMPARISON:  Pelvis series of August 25, 2013 FINDINGS: There is an abnormal appearance of the superior pubic ramus on the left consistent with a healing fracture a small amount of periosteal reaction is visible. A subtle lucency along the inferior aspect of the inferior pubic ramus could reflect a fracture but this is not a definite finding. The pubic bones on the right are intact. The iliac bones are intact. The observed portions of the sacrum are normal. There degenerative changes of the lumbar spine. The hip joint space on the right is mildly narrowed. IMPRESSION: Findings compatible with a subacute fracture of the left superior pubic ramus. I cannot exclude a nondisplaced fracture through the medial aspect of the left inferior pubic ramus. Elsewhere the pelvis appears intact. There is subtle lucency in the intertrochanteric region of the left hip. This was faintly visible on the previous study. A left femur series would be useful. Electronically Signed   By: David  [place] M.D.   On: 07/03/2017 08:15   Dg Forearm Right  Result Date: 07/04/2017 CLINICAL DATA:  Status post fall 2 weeks ago. EXAM: RIGHT FOREARM - 2 VIEW COMPARISON:  None. FINDINGS: There is no evidence of fracture or other focal bone lesions. There is a  K-wire transfixing the fourth metacarpal base extending into the hamate. Soft tissues are unremarkable. IMPRESSION: No acute osseous injury of the right forearm. Electronically Signed   By: Elige Ko   On: 07/04/2017 11:29   Dg  Wrist 2 Views Left  Result Date: 07/03/2017 CLINICAL DATA:  Larey Seat 2 weeks ago with persistent left wrist pain EXAM: LEFT WRIST - 2 VIEW COMPARISON:  None. FINDINGS: The left radiocarpal joint space is mildly narrowed with only mild degenerative change. The carpal bones are in normal position with normal intercarpal joint spaces. No fracture is seen and no malalignment is noted. IMPRESSION: Only mild degenerative change.  No acute abnormality. Electronically Signed   By: Dwyane Dee M.D.   On: 07/03/2017 08:18   Ct Head Wo Contrast  Result Date: 07/25/2017 CLINICAL DATA:  Difficulty with ambulation.  Tremors. EXAM: CT HEAD WITHOUT CONTRAST TECHNIQUE: Contiguous axial images were obtained from the base of the skull through the vertex without intravenous contrast. COMPARISON:  May 20, 2016 FINDINGS: Brain: Mild diffuse atrophy is stable. There is no intracranial mass, hemorrhage, extra-axial fluid collection, or midline shift. There is patchy small vessel disease in the centra semiovale bilaterally. Elsewhere gray-white compartments are normal. No evident acute infarct. Vascular: No hyperdense vessel evident. There is calcification in each cavernous carotid artery region. Skull: Bony calvarium appears intact. Sinuses/Orbits: There is mucosal thickening in several ethmoid air cells. Other visualized paranasal sinuses are clear. Orbits appear symmetric bilaterally. Patient has had cataract removal bilaterally. Other: Postoperative change noted in the left mastoid region. Aerated mastoid air cells bilaterally are clear. There is debris in each external auditory canal. IMPRESSION: 1. Mild atrophy with patchy periventricular small vessel disease, stable. No intracranial mass, hemorrhage, or  extra-axial fluid collection. No evident acute infarct. 2.  Mild arterial vascular calcification noted. 3. Postoperative change left mastoid region. Aerated mastoid air cells are clear. 4.  Mucosal thickening in several ethmoid air cells. 5.  Probable cerumen in each external auditory canal. Electronically Signed   By: Bretta Bang III M.D.   On: 07/25/2017 10:09   Ct Chest Wo Contrast  Result Date: 07/25/2017 CLINICAL DATA:  Multiple right rib fractures falling a fall with a moderate-sized right pleural effusion. The patient is anticoagulated. Clinical concern for pneumothorax. EXAM: CT CHEST WITHOUT CONTRAST TECHNIQUE: Multidetector CT imaging of the chest was performed following the standard protocol without IV contrast. COMPARISON:  Chest radiographs obtained earlier today. FINDINGS: Cardiovascular: Atheromatous calcifications, including the coronary arteries and aorta. Normal sized heart. Minimal pericardial effusion with a maximum thickness of 4 mm. Mediastinum/Nodes: The right lobe of the thyroid gland is enlarged and extending posteriorly to the level of the spine. There is also a 9.5 mm nodule in the right lobe of the thyroid gland as well is a small or nodule. No enlarged lymph nodes. Lungs/Pleura: Moderately large right pleural effusion measuring 10 Hounsfield units in density. Minimal left pleural fluid. Compressive atelectasis of the right lower lobe. There is also dependent atelectasis in the left lower lobe and inferior aspect of the lingula. There is also mild dependent atelectasis in the right upper lobe. Small amount of patchy opacity in the medial aspect of the left upper lobe, anteriorly. No pneumothorax. Upper Abdomen: Abdominal aortic calcifications. Musculoskeletal: Healing fracture of the distal right scapula, best demonstrated on the coronal images. Displaced right posterior third, fourth, fifth, sixth, seventh, eighth, ninth, tenth, eleventh rib fractures with partial bridging  callus formation. There are also right third, fourth, fifth, sixth, seventh posterolateral rib fractures with partially bridging callus. There are additional old, healed bilateral rib fractures. No vertebral fractures or subluxations are seen. Thoracic spine degenerative changes are noted. IMPRESSION: 1. Subacute right third through eleventh rib fractures with  partial bridging callus formation. 2. Additional old, healed bilateral rib fractures. 3. No acute fractures seen today. 4. Subacute, healing distal right scapula fracture. 5. Moderately large, simple right pleural effusion. This does not have features of an acute hemothorax. 6. Minimal left pleural effusion. 7. Bilateral atelectasis. 8. Small amount of patchy atelectasis or pneumonia in the anteromedial aspect of the left upper lobe. 9. 9.5 mm and smaller right lobe thyroid nodules. These are too small to characterize, but most likely benign in the absence of known clinical risk factors for thyroid carcinoma. 10.  Calcific coronary artery and aortic atherosclerosis. 11. Minimal pericardial effusion. Aortic Atherosclerosis (ICD10-I70.0). Electronically Signed   By: Beckie Salts M.D.   On: 07/25/2017 17:32   Mr Brain Wo Contrast  Result Date: 07/25/2017 CLINICAL DATA:  Ataxia.  Difficulty walking. EXAM: MRI HEAD WITHOUT CONTRAST TECHNIQUE: Multiplanar, multiecho pulse sequences of the brain and surrounding structures were obtained without intravenous contrast. COMPARISON:  Head CT 07/25/2017 FINDINGS: The examination had to be discontinued prior to completion due to claustrophobia despite receiving medication. Axial coronal diffusion, axial T2, and axial FLAIR sequences were obtained and are mildly motion degraded. Brain: There is a 2 mm focus of hyperintense signal in the superior right cerebellum on the axial diffusion sequence with corresponding reduced ADC, not confirmed on the coronal diffusion sequences though evaluation is limited by its small size  and slice selection. There is no evidence of acute infarct elsewhere. No intracranial hemorrhage, mass, midline shift, or extra-axial fluid collection is identified. There is mild generalized cerebral atrophy for age. Periventricular white matter T2 hyperintensity is nonspecific but compatible with minimal chronic small vessel ischemic disease, not greater than expected for age. Vascular: Major intracranial vascular flow voids are preserved. Skull and upper cervical spine: No gross osseous lesion. Sinuses/Orbits: Bilateral cataract extraction. Paranasal sinuses and mastoid air cells are clear. Trace fluid in the new nasopharynx. Other: None. IMPRESSION: 1. Motion degraded, incomplete examination. 2. Suspected punctate acute infarct in the right cerebellum. Electronically Signed   By: Sebastian Ache M.D.   On: 07/25/2017 15:22   Dg Hand Complete Right  Result Date: 07/03/2017 CLINICAL DATA:  Larey Seat 2 weeks ago with right hand pain EXAM: RIGHT HAND - COMPLETE 3+ VIEW COMPARISON:  Right hand films of 02/13/2016 FINDINGS: K-wire remains within the base of the fifth metacarpal extending into the hamate bone. No acute fracture is seen. There are degenerative changes involving the DIP and to a lesser degree PIP joints diffusely. IMPRESSION: No acute fracture.  Degenerative and post traumatic change. Electronically Signed   By: Dwyane Dee M.D.   On: 07/03/2017 08:20    Lab Data:  CBC:  Recent Labs Lab 07/25/17 1015 07/26/17 0728  WBC 6.2 4.4  HGB 10.2* 10.5*  HCT 32.3* 32.5*  MCV 94.7 94.5  PLT 130* 126*   Basic Metabolic Panel:  Recent Labs Lab 07/25/17 1015  NA 141  K 4.4  CL 108  CO2 27  GLUCOSE 121*  BUN 30*  CREATININE 1.53*  CALCIUM 8.8*   GFR: Estimated Creatinine Clearance: 28.1 mL/min (A) (by C-G formula based on SCr of 1.53 mg/dL (H)). Liver Function Tests: No results for input(s): AST, ALT, ALKPHOS, BILITOT, PROT, ALBUMIN in the last 168 hours. No results for input(s): LIPASE,  AMYLASE in the last 168 hours. No results for input(s): AMMONIA in the last 168 hours. Coagulation Profile: No results for input(s): INR, PROTIME in the last 168 hours. Cardiac Enzymes: No results for input(s):  CKTOTAL, CKMB, CKMBINDEX, TROPONINI in the last 168 hours. BNP (last 3 results) No results for input(s): PROBNP in the last 8760 hours. HbA1C:  Recent Labs  07/26/17 0728  HGBA1C 6.0*   CBG: No results for input(s): GLUCAP in the last 168 hours. Lipid Profile:  Recent Labs  07/26/17 0728  CHOL 125  HDL 46  LDLCALC 66  TRIG 64  CHOLHDL 2.7   Thyroid Function Tests: No results for input(s): TSH, T4TOTAL, FREET4, T3FREE, THYROIDAB in the last 72 hours. Anemia Panel: No results for input(s): VITAMINB12, FOLATE, FERRITIN, TIBC, IRON, RETICCTPCT in the last 72 hours. Urine analysis:    Component Value Date/Time   COLORURINE YELLOW 07/25/2017 1144   APPEARANCEUR HAZY (A) 07/25/2017 1144   LABSPEC 1.012 07/25/2017 1144   LABSPEC 1.015 07/01/2017 1018   PHURINE 6.0 07/25/2017 1144   GLUCOSEU NEGATIVE 07/25/2017 1144   HGBUR NEGATIVE 07/25/2017 1144   BILIRUBINUR NEGATIVE 07/25/2017 1144   BILIRUBINUR negative 07/01/2017 1018   BILIRUBINUR 1+ 12/02/2016 1204   KETONESUR NEGATIVE 07/25/2017 1144   PROTEINUR NEGATIVE 07/25/2017 1144   UROBILINOGEN negative 12/02/2016 1204   NITRITE NEGATIVE 07/25/2017 1144   LEUKOCYTESUR TRACE (A) 07/25/2017 1144     Lastacia Solum M.D. [facility] 07/26/2017, 10:41 AM  Pager: [phone] Between 7am to 7pm - call Pager - [phone]  After 7pm go to [email or web address] - password [email or web address]  Call night coverage person covering after 7pm

## 2017-07-27 ENCOUNTER — Inpatient Hospital Stay (HOSPITAL_COMMUNITY): Payer: Medicare Other

## 2017-07-27 LAB — CBC
HEMATOCRIT: 31.1 % — AB (ref 36.0–46.0)
Hemoglobin: 9.9 g/dL — ABNORMAL LOW (ref 12.0–15.0)
MCH: 29.9 pg (ref 26.0–34.0)
MCHC: 31.8 g/dL (ref 30.0–36.0)
MCV: 94 fL (ref 78.0–100.0)
PLATELETS: 120 10*3/uL — AB (ref 150–400)
RBC: 3.31 MIL/uL — AB (ref 3.87–5.11)
RDW: 12.4 % (ref 11.5–15.5)
WBC: 4.7 10*3/uL (ref 4.0–10.5)

## 2017-07-27 LAB — ECHOCARDIOGRAM COMPLETE
HEIGHTINCHES: 68 in
Weight: 2144 oz

## 2017-07-27 LAB — PROTIME-INR
INR: 1.12
Prothrombin Time: 14.3 seconds (ref 11.4–15.2)

## 2017-07-27 MED ORDER — WARFARIN SODIUM 5 MG PO TABS
5.0000 mg | ORAL_TABLET | Freq: Once | ORAL | Status: AC
  Administered 2017-07-27: 5 mg via ORAL
  Filled 2017-07-27: qty 1

## 2017-07-27 MED ORDER — FUROSEMIDE 20 MG PO TABS
20.0000 mg | ORAL_TABLET | Freq: Every day | ORAL | Status: DC
  Administered 2017-07-27 – 2017-07-29 (×3): 20 mg via ORAL
  Filled 2017-07-27 (×3): qty 1

## 2017-07-27 NOTE — Progress Notes (Signed)
Subjective/Chief Complaint: No complaints. Resting comfortably   Objective: Vital signs in last 24 hours: Temp:  [98.2 F (36.8 C)-99.2 F (37.3 C)] 98.2 F (36.8 C) (09/30 0643) Pulse Rate:  [80-88] 82 (09/30 0643) Resp:  [16-20] 16 (09/30 0643) BP: (128-150)/(79-90) 130/90 (09/30 0643) SpO2:  [79 %-99 %] 94 % (09/30 0643)    Intake/Output from previous day: No intake/output data recorded. Intake/Output this shift: No intake/output data recorded.  General appearance: alert, cooperative and with tremors Resp: clear to auscultation bilaterally Cardio: regular rate and rhythm GI: soft, non-tender; bowel sounds normal; no masses,  no organomegaly  Lab Results:   Recent Labs  07/26/17 0728 07/27/17 0409  WBC 4.4 4.7  HGB 10.5* 9.9*  HCT 32.5* 31.1*  PLT 126* 120*   BMET  Recent Labs  07/25/17 1015  NA 141  K 4.4  CL 108  CO2 27  GLUCOSE 121*  BUN 30*  CREATININE 1.53*  CALCIUM 8.8*   PT/INR  Recent Labs  07/26/17 1145 07/27/17 0409  LABPROT 13.7 14.3  INR 1.06 1.12   ABG No results for input(s): PHART, HCO3 in the last 72 hours.  Invalid input(s): PCO2, PO2  Studies/Results: Dg Chest 2 View  Result Date: 07/25/2017 CLINICAL DATA:  Weakness.  Ex-smoker. EXAM: CHEST  2 VIEW COMPARISON:  05/09/2017. FINDINGS: Interval significantly displaced fractures of the right third, fourth, fifth, sixth and seventh ribs posteriorly. Moderate-sized right pleural effusion and minimal left pleural effusion. Mild right basilar atelectasis and minimal left basilar atelectasis. Diffuse peribronchial thickening without significant change. No pneumothorax. Thoracic spine degenerative changes. IMPRESSION: 1. Interval displaced right third through seventh rib fractures. 2. Moderate-sized right pleural effusion and minimal left pleural effusion. 3. Mild right basilar atelectasis and minimal left basilar atelectasis. 4. Stable chronic bronchitic changes. Electronically  Signed   By: Beckie Salts M.D.   On: 07/25/2017 10:12   Ct Head Wo Contrast  Result Date: 07/25/2017 CLINICAL DATA:  Difficulty with ambulation.  Tremors. EXAM: CT HEAD WITHOUT CONTRAST TECHNIQUE: Contiguous axial images were obtained from the base of the skull through the vertex without intravenous contrast. COMPARISON:  May 20, 2016 FINDINGS: Brain: Mild diffuse atrophy is stable. There is no intracranial mass, hemorrhage, extra-axial fluid collection, or midline shift. There is patchy small vessel disease in the centra semiovale bilaterally. Elsewhere gray-white compartments are normal. No evident acute infarct. Vascular: No hyperdense vessel evident. There is calcification in each cavernous carotid artery region. Skull: Bony calvarium appears intact. Sinuses/Orbits: There is mucosal thickening in several ethmoid air cells. Other visualized paranasal sinuses are clear. Orbits appear symmetric bilaterally. Patient has had cataract removal bilaterally. Other: Postoperative change noted in the left mastoid region. Aerated mastoid air cells bilaterally are clear. There is debris in each external auditory canal. IMPRESSION: 1. Mild atrophy with patchy periventricular small vessel disease, stable. No intracranial mass, hemorrhage, or extra-axial fluid collection. No evident acute infarct. 2.  Mild arterial vascular calcification noted. 3. Postoperative change left mastoid region. Aerated mastoid air cells are clear. 4.  Mucosal thickening in several ethmoid air cells. 5.  Probable cerumen in each external auditory canal. Electronically Signed   By: Bretta Bang III M.D.   On: 07/25/2017 10:09   Ct Chest Wo Contrast  Result Date: 07/25/2017 CLINICAL DATA:  Multiple right rib fractures falling a fall with a moderate-sized right pleural effusion. The patient is anticoagulated. Clinical concern for pneumothorax. EXAM: CT CHEST WITHOUT CONTRAST TECHNIQUE: Multidetector CT imaging of the chest was  performed  following the standard protocol without IV contrast. COMPARISON:  Chest radiographs obtained earlier today. FINDINGS: Cardiovascular: Atheromatous calcifications, including the coronary arteries and aorta. Normal sized heart. Minimal pericardial effusion with a maximum thickness of 4 mm. Mediastinum/Nodes: The right lobe of the thyroid gland is enlarged and extending posteriorly to the level of the spine. There is also a 9.5 mm nodule in the right lobe of the thyroid gland as well is a small or nodule. No enlarged lymph nodes. Lungs/Pleura: Moderately large right pleural effusion measuring 10 Hounsfield units in density. Minimal left pleural fluid. Compressive atelectasis of the right lower lobe. There is also dependent atelectasis in the left lower lobe and inferior aspect of the lingula. There is also mild dependent atelectasis in the right upper lobe. Small amount of patchy opacity in the medial aspect of the left upper lobe, anteriorly. No pneumothorax. Upper Abdomen: Abdominal aortic calcifications. Musculoskeletal: Healing fracture of the distal right scapula, best demonstrated on the coronal images. Displaced right posterior third, fourth, fifth, sixth, seventh, eighth, ninth, tenth, eleventh rib fractures with partial bridging callus formation. There are also right third, fourth, fifth, sixth, seventh posterolateral rib fractures with partially bridging callus. There are additional old, healed bilateral rib fractures. No vertebral fractures or subluxations are seen. Thoracic spine degenerative changes are noted. IMPRESSION: 1. Subacute right third through eleventh rib fractures with partial bridging callus formation. 2. Additional old, healed bilateral rib fractures. 3. No acute fractures seen today. 4. Subacute, healing distal right scapula fracture. 5. Moderately large, simple right pleural effusion. This does not have features of an acute hemothorax. 6. Minimal left pleural effusion. 7. Bilateral  atelectasis. 8. Small amount of patchy atelectasis or pneumonia in the anteromedial aspect of the left upper lobe. 9. 9.5 mm and smaller right lobe thyroid nodules. These are too small to characterize, but most likely benign in the absence of known clinical risk factors for thyroid carcinoma. 10.  Calcific coronary artery and aortic atherosclerosis. 11. Minimal pericardial effusion. Aortic Atherosclerosis (ICD10-I70.0). Electronically Signed   By: Beckie Salts M.D.   On: 07/25/2017 17:32   Mr Brain Wo Contrast  Result Date: 07/25/2017 CLINICAL DATA:  Ataxia.  Difficulty walking. EXAM: MRI HEAD WITHOUT CONTRAST TECHNIQUE: Multiplanar, multiecho pulse sequences of the brain and surrounding structures were obtained without intravenous contrast. COMPARISON:  Head CT 07/25/2017 FINDINGS: The examination had to be discontinued prior to completion due to claustrophobia despite receiving medication. Axial coronal diffusion, axial T2, and axial FLAIR sequences were obtained and are mildly motion degraded. Brain: There is a 2 mm focus of hyperintense signal in the superior right cerebellum on the axial diffusion sequence with corresponding reduced ADC, not confirmed on the coronal diffusion sequences though evaluation is limited by its small size and slice selection. There is no evidence of acute infarct elsewhere. No intracranial hemorrhage, mass, midline shift, or extra-axial fluid collection is identified. There is mild generalized cerebral atrophy for age. Periventricular white matter T2 hyperintensity is nonspecific but compatible with minimal chronic small vessel ischemic disease, not greater than expected for age. Vascular: Major intracranial vascular flow voids are preserved. Skull and upper cervical spine: No gross osseous lesion. Sinuses/Orbits: Bilateral cataract extraction. Paranasal sinuses and mastoid air cells are clear. Trace fluid in the new nasopharynx. Other: None. IMPRESSION: 1. Motion degraded,  incomplete examination. 2. Suspected punctate acute infarct in the right cerebellum. Electronically Signed   By: Sebastian Ache M.D.   On: 07/25/2017 15:22    Anti-infectives: Anti-infectives  None      Assessment/Plan: s/p * No surgery found * Advance diet  CVA per Neurology. Carotid u/s, echo, swallow eval. Continue xarelto Rib fx. Pain control and IS  LOS: 2 days    TOTH III,PAUL S 07/27/2017

## 2017-07-27 NOTE — Progress Notes (Signed)
STROKE TEAM PROGRESS NOTE   HISTORY OF PRESENT ILLNESS (per record) [name] is an 82 y.o. female with dementia, tremors, history of DVT on Xarelto, HTN, HLD, DM who lives in a group home who presents to ER with fall.  She was  apparently was found to more difficulty walking since 9/27 and found leaning to right. Patient uses a walker most of the time to get around, but occassionally uses a wheelchair, and she seemed to be having more trouble with her gait yesterday. She was brought to the Er and MRI showed a punctate infarct in the cerebellum. Patient was admitted to hospitalist team and neurology was consulted for stroke workup.  History obtained by chart, patient has no knowledge of why she is in the hospital.    Date last known well: 9.27.18 Time last known well: unclear tPA Given: no, outside window NIHSS 0 Modified Rankin: 0   SUBJECTIVE (INTERVAL HISTORY) No family at bedside, no events overnight   OBJECTIVE Temp:  [98.2 F (36.8 C)-99.2 F (37.3 C)] 98.2 F (36.8 C) (09/30 0643) Pulse Rate:  [80-88] 82 (09/30 0643) Cardiac Rhythm: Normal sinus rhythm;Bundle branch block (09/30 0700) Resp:  [16-20] 16 (09/30 0643) BP: (128-150)/(79-90) 130/90 (09/30 0643) SpO2:  [79 %-99 %] 94 % (09/30 0643)  CBC:   Recent Labs Lab 07/26/17 0728 07/27/17 0409  WBC 4.4 4.7  HGB 10.5* 9.9*  HCT 32.5* 31.1*  MCV 94.5 94.0  PLT 126* 120*    Basic Metabolic Panel:   Recent Labs Lab 07/25/17 1015  NA 141  K 4.4  CL 108  CO2 27  GLUCOSE 121*  BUN 30*  CREATININE 1.53*  CALCIUM 8.8*    Lipid Panel:     Component Value Date/Time   CHOL 125 07/26/2017 0728   TRIG 64 07/26/2017 0728   HDL 46 07/26/2017 0728   CHOLHDL 2.7 07/26/2017 0728   VLDL 13 07/26/2017 0728   LDLCALC 66 07/26/2017 0728   HgbA1c:  Lab Results  Component Value Date   HGBA1C 6.0 (H) 07/26/2017   Urine Drug Screen: No results found for: LABOPIA, COCAINSCRNUR, LABBENZ, AMPHETMU, THCU,  LABBARB  Alcohol Level No results found for: [facility]  IMAGING  Dg Chest 2 View 07/25/2017 IMPRESSION:  1. Interval displaced right third through seventh rib fractures.  2. Moderate-sized right pleural effusion and minimal left pleural effusion.  3. Mild right basilar atelectasis and minimal left basilar atelectasis.  4. Stable chronic bronchitic changes.    Ct Head Wo Contrast 07/25/2017 IMPRESSION:  1. Mild atrophy with patchy periventricular small vessel disease, stable. No intracranial mass, hemorrhage, or extra-axial fluid collection. No evident acute infarct.  2.  Mild arterial vascular calcification noted.  3. Postoperative change left mastoid region. Aerated mastoid air cells are clear.  4.  Mucosal thickening in several ethmoid air cells.  5.  Probable cerumen in each external auditory canal.    Ct Chest Wo Contrast 07/25/2017 IMPRESSION:  1. Subacute right third through eleventh rib fractures with partial bridging callus formation.  2. Additional old, healed bilateral rib fractures.  3. No acute fractures seen today.  4. Subacute, healing distal right scapula fracture.  5. Moderately large, simple right pleural effusion. This does not have features of an acute hemothorax.  6. Minimal left pleural effusion.  7. Bilateral atelectasis.  8. Small amount of patchy atelectasis or pneumonia in the anteromedial aspect of the left upper lobe.  9. 9.5 mm and smaller right lobe thyroid nodules. These  are too small to characterize, but most likely benign in the absence of known clinical risk factors for thyroid carcinoma.  10.  Calcific coronary artery and aortic atherosclerosis.  11. Minimal pericardial effusion. Aortic Atherosclerosis (ICD10-I70.0).     Mr Brain Wo Contrast9/28/2018 IMPRESSION:  1. Motion degraded, incomplete examination.  2. Suspected punctate acute infarct in the right cerebellum.    Transthoracic Echocardiogram - pending   Bilateral Carotid  Dopplers 07/26/2017 Bilateral:  1-39% ICA stenosis. Right - Vertebral artery flow is antegrade.  Left vertebral could not be insonated due to constant tremors     PHYSICAL EXAM Vitals:   07/26/17 1959 07/26/17 2155 07/27/17 0129 07/27/17 0643  BP:  (!) 150/89 (!) 148/85 130/90  Pulse:  85 80 82  Resp:  16 16 16   Temp:  99.2 F (37.3 C) 98.3 F (36.8 C) 98.2 F (36.8 C)  TempSrc:  Oral Oral Oral  SpO2: 99% 94% 97% 94%  Weight:      Height:       Physical exam: Exam: Gen: NAD,   Neuro: Detailed Neurologic Exam  Speech:    Not aphasic Cognition:    The patient is oriented to person    recent and remote memory impaired;     Cranial Nerves:    The pupils are equal, round, and reactive to light.  Visual fields are full to finger confrontation. Extraocular movements are intact. Trigeminal sensation is intact and the muscles of mastication are normal. The face is symmetric. The palate elevates in the midline. Hearing intact. Voice is normal. Shoulder shrug is normal. The tongue has normal motion without fasciculations.   Coordination:    Bradykinetic with tremors.   Motor Observation:    Resting tremor in hands and mouth    Strength:    Strength is symmetric and anti-gravity in the upper and lower limbs. No deficit noted,      Sensation: intact to LT     Reflex Exam:  DTR's:    Deep tendon reflexes in the upper and lower extremities are hyporeflexic bilaterally.     ASSESSMENT/PLAN [name] is a 82 y.o. female with history of dementia, thrombocytopenia, mild mental retardation, HOCM, CKD,  tremors, history of DVT on Xarelto, HTN, HLD, and DM   presenting with gait difficulties and a fall. She did not receive IV t-PA due to late presentation.  Stroke:  Suspected punctate acute infarct in the right cerebellum. Likely due to small vessel disease.  Resultant  Resolving deficits  CT head - No evident acute infarct.   MRI head - Suspected punctate acute  infarct in the right cerebellum.   MRA head - not performed.  Carotid Doppler - 1-39% ICA stenosis. RT VA is antegrade.  Lt VA could not be insonated due to constant tremors  2D Echo  - pending  LDL - 66  HgbA1c - 6.0  VTE prophylaxis - Lovenox Diet Heart Room service appropriate? Yes; Fluid consistency: Thin  Xarelto (rivaroxaban) daily prior to admission, now on warfarin daily  Ongoing aggressive stroke risk factor management  Therapy recommendations:  SNF recommended  Disposition: Pending  Hypertension  Stable  Permissive hypertension (OK if < 220/120) but gradually normalize in 5-7 days  Long-term BP goal normotensive  Hyperlipidemia  Home meds:  Zocor 20 mg daily prior to admission   LDL 66, goal < 70  Now on Lipitor 40 mg daily  Continue statin at discharge  Diabetes  HgbA1c 6.0, goal < 7.0  Controlled  Other Stroke Risk Factors  Advanced age  Former cigarette smoker - quit   Other Active Problems  Occasional low O2 sat - bilateral pleural effusions  Anemia / thrombocytopenia  Chronic kidney disease - 30 / 1.53 creatinine clearance 28  Multiple fractures  Hospital day # 2  Personally examined patient and images,  history, physical, neuro exam,assessment and plan as stated above.  I have personally obtained the history, evaluated lab date, reviewed imaging studies and agree with radiology interpretations.Neuro will sign off, follow up in neurology with Dr. Marjory Lies. Please re-consult if necessary  To contact Stroke Continuity provider, please refer to [email or web address]. After hours, contact General Neurology

## 2017-07-27 NOTE — Progress Notes (Signed)
ANTICOAGULATION CONSULT NOTE - Initial Consult  Pharmacy Consult for Warfarin/Lovenox Bridge Indication: stroke  Allergies  Allergen Reactions  . Levofloxacin Other (See Comments)    unknown    Patient Measurements: Height: _0  (172.7 cm) Weight: 134 lb (60.8 kg) IBW/kg (Calculated) : 63.9  Vital Signs: Temp: 98.2 F (36.8 C) (09/30 0643) Temp Source: Oral (09/30 0643) BP: 130/90 (09/30 0643) Pulse Rate: 82 (09/30 0643)  Labs:  Recent Labs  07/25/17 1015 07/26/17 0728 07/26/17 1145 07/27/17 0409  HGB 10.2* 10.5*  --  9.9*  HCT 32.3* 32.5*  --  31.1*  PLT 130* 126*  --  120*  LABPROT  --   --  13.7 14.3  INR  --   --  1.06 1.12  CREATININE 1.53*  --   --   --     Estimated Creatinine Clearance: 28.1 mL/min (A) (by C-G formula based on SCr of 1.53 mg/dL (H)).   Medical History: Past Medical History:  Diagnosis Date  . Allergy   . Anemia of chronic disease 10/2010   stable as of 2015, on iron therapy for mild iron deficiency, chronic kidney disease, anemia of chronic disease; Dr. Ralene Ok prior consult  . Chronic kidney disease   . Constipation    mild intermittent  . Depression    [facility] - NP Eugenie Norrie  . Diabetes mellitus   . Diabetes type 2, controlled ([place])   . Edentulous   . Falls   . GERD (gastroesophageal reflux disease)   . H/O cardiovascular stress test 06/2014   nuclear stress test normal; Dr. Wyatt Haste  . H/O echocardiogram 06/17/2014   mild LVH, EF 60-65%, no wall motion abnormalities // Echo 11/17: EF 55-60, normal wall motion, grade 2 diastolic dysfunction, MAC  . H/O mammogram 08/04/2014   normal  . History of MRI of brain and brain stem 11/2010   normal MRI of brain  . Hx of deep venous thrombosis    lifelong anticoagulant therapy  . Hyperlipidemia   . Hypertension   . Hypertrophic obstructive cardiomyopathy (HOCM) ([place]) 2015   normal echo and stress test other than mild LVH 06/2014; Dr. Dorris Carnes  . Long term  current use of anticoagulant therapy    due to hx/o recurrent DVT  . Mild mental retardation   . Moderate persistent asthma    asthma and bronchiectasis, pulm consult 2015; unable to do PFTs, 2015  . Mood disorder ([place])    [facility] - NP Eugenie Norrie  . Osteoarthritis of both knees    Dr. Frederik Pear  . Osteopenia 2013   improvements on Boniva and Ca+D from 2011-2013.  2013 Bone Density normal /improved; repeat Bone Density scan 2016  . PVD (peripheral vascular disease) ([place])   . Shortness of breath    06/2014 cardiac consult, SOB seems to be related to poor technique with handheld inhaler, switched to nebs with much improvement  . Thrombocytopenia ([place]) 10/2010   due to medications and immune dysregulation likely, stable as of 2015; no further investigation; hematology, Dr. Ralene Ok  . Tremor 2015   consult with Dr. Netta Neat Neurology.  Parkinsonian tremor without Parkinsons    Assessment: 81 yoF presenting with acute cerebellar stroke, on Xarelto PTA for history of DVT. Pharmacy consulted to start warfarin with lovenox bridge until therapeutic INR. INR this AM is 1.12. H&H, pltc remains low but stable. On heart diet, no documented PO intake. No bleeding noted.    Goal  of Therapy:  INR 2-3 Monitor platelets by anticoagulation protocol: Yes   Plan:  Give warfarin 66m x1 Continue lovenox 622mSQ q24h Daily INR, CBC Monitor for s/sx of bleeding   Erin N. DeGerarda FractionPharmD PGY1 Pharmacy Resident Pager: [phone]

## 2017-07-27 NOTE — Discharge Instructions (Signed)
Information on my medicine - Coumadin®   (Warfarin) ° °This medication education was reviewed with me or my healthcare representative as part of my discharge preparation.  The pharmacist that spoke with me during my hospital stay was:  Jennife Zaucha N Ruther Ephraim, RPH ° °Why was Coumadin prescribed for you? °Coumadin was prescribed for you because you have a blood clot or a medical condition that can cause an increased risk of forming blood clots. Blood clots can cause serious health problems by blocking the flow of blood to the heart, lung, or brain. Coumadin can prevent harmful blood clots from forming. °As a reminder your indication for Coumadin is:   Deep Vein Thrombosis Treatment ° °What test will check on my response to Coumadin? °While on Coumadin (warfarin) you will need to have an INR test regularly to ensure that your dose is keeping you in the desired range. The INR (international normalized ratio) number is calculated from the result of the laboratory test called prothrombin time (PT). ° °If an INR APPOINTMENT HAS NOT ALREADY BEEN MADE FOR YOU please schedule an appointment to have this lab work done by your health care provider within 7 days. °Your INR goal is usually a number between:  2 to 3 or your provider may give you a more narrow range like 2-2.5.  Ask your health care provider during an office visit what your goal INR is. ° °What  do you need to  know  About  COUMADIN? °Take Coumadin (warfarin) exactly as prescribed by your healthcare provider about the same time each day.  DO NOT stop taking without talking to the doctor who prescribed the medication.  Stopping without other blood clot prevention medication to take the place of Coumadin may increase your risk of developing a new clot or stroke.  Get refills before you run out. ° °What do you do if you miss a dose? °If you miss a dose, take it as soon as you remember on the same day then continue your regularly scheduled regimen the next day.  Do not take two  doses of Coumadin at the same time. ° °Important Safety Information °A possible side effect of Coumadin (Warfarin) is an increased risk of bleeding. You should call your healthcare provider right away if you experience any of the following: °? Bleeding from an injury or your nose that does not stop. °? Unusual colored urine (red or dark brown) or unusual colored stools (red or black). °? Unusual bruising for unknown reasons. °? A serious fall or if you hit your head (even if there is no bleeding). ° °Some foods or medicines interact with Coumadin® (warfarin) and might alter your response to warfarin. To help avoid this: °? Eat a balanced diet, maintaining a consistent amount of Vitamin K. °? Notify your provider about major diet changes you plan to make. °? Avoid alcohol or limit your intake to 1 drink for women and 2 drinks for men per day. °(1 drink is 5 oz. wine, 12 oz. beer, or 1.5 oz. liquor.) ° °Make sure that ANY health care provider who prescribes medication for you knows that you are taking Coumadin (warfarin).  Also make sure the healthcare provider who is monitoring your Coumadin knows when you have started a new medication including herbals and non-prescription products. ° °Coumadin® (Warfarin)  Major Drug Interactions  °Increased Warfarin Effect Decreased Warfarin Effect  °Alcohol (large quantities) °Antibiotics (esp. Septra/Bactrim, Flagyl, Cipro) °Amiodarone (Cordarone) °Aspirin (ASA) °Cimetidine (Tagamet) °Megestrol (Megace) °NSAIDs (ibuprofen, naproxen, etc.) °  Piroxicam (Feldene) Propafenone (Rythmol SR) Propranolol (Inderal) Isoniazid (INH) Posaconazole (Noxafil) Barbiturates (Phenobarbital) Carbamazepine (Tegretol) Chlordiazepoxide (Librium) Cholestyramine (Questran) Griseofulvin Oral Contraceptives Rifampin Sucralfate (Carafate) Vitamin K   Coumadin (Warfarin) Major Herbal Interactions  Increased Warfarin Effect Decreased Warfarin Effect  Garlic Ginseng Ginkgo biloba Coenzyme  Q10 Green tea St. Johns wort    Coumadin (Warfarin) FOOD Interactions  Eat a consistent number of servings per week of foods HIGH in Vitamin K (1 serving =  cup)  Collards (cooked, or boiled & drained) Kale (cooked, or boiled & drained) Mustard greens (cooked, or boiled & drained) Parsley *serving size only =  cup Spinach (cooked, or boiled & drained) Swiss chard (cooked, or boiled & drained) Turnip greens (cooked, or boiled & drained)  Eat a consistent number of servings per week of foods MEDIUM-HIGH in Vitamin K (1 serving = 1 cup)  Asparagus (cooked, or boiled & drained) Broccoli (cooked, boiled & drained, or raw & chopped) Brussel sprouts (cooked, or boiled & drained) *serving size only =  cup Lettuce, raw (green leaf, endive, romaine) Spinach, raw Turnip greens, raw & chopped   These websites have more information on Coumadin (warfarin):  FailFactory.se; VeganReport.com.au;
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
## 2017-07-27 NOTE — Progress Notes (Signed)
  Echocardiogram 2D Echocardiogram has been performed.  Precious Segall T Elorah Dewing 07/27/2017, 12:25 PM

## 2017-07-27 NOTE — Progress Notes (Signed)
[facility]                                                                              Patient Demographics  [name], is a 82 y.o. female, DOB - [birth year], BUY:[id number]  Admit date - 07/25/2017   Admitting Physician Delano Metz, MD  Outpatient Primary MD for the patient is Tysinger, Kermit Balo, PA-C  Outpatient specialists:   LOS - 2  days   Medical records reviewed and are as summarized below:    Chief Complaint  Patient presents with  . Weakness       Brief summary   Per admit note by Dr. Arta Silence on 9/28 [name] is a 82 y.o. female with medical history significant for developmental disability, chronic anemia, history of recurrent DVT on chronic anticoagulation, asthma, diabetes, hypertension, dyslipidemia, HOCM and recent issues with recurrent falls. Group home reported the patient has had intermittent difficulty with ambulating and leaning to the right starting at noon on 9/27. Despite multiple attempts, patient unable to mobilize even with assistance in the ER and was very reluctant to get out of her wheelchair. According to her sister who is at the bedside (and who is also her legal guardian) patient has been having frequent falls at the facility. MR brain revealed suspected punctate acute infarct in the right cerebellum   Assessment & Plan    Principal Problem:  acute Cerebellar stroke [facility] [place]) - patient sent from the group home with having difficulty ambulating, preferential right side positioning. MRI of the brain with suspected punctate acute infarcts in the right cerebellum - carotid Dopplers showed 1-39% ICA stenosis - PT evaluation recommended skilled nursing facility - neurology was consulted and do not strongly feel that it is responsible for patient's symptoms and possibly an incidental finding, patient is already on xarelto. 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 - Neurology recommended to consider weaning Depakote and replacing with lamotrigine - per neurology recommendations, Depakote decreased to 500 mg daily ( previously was on 500 mg twice a day), added lamotrigine 25 mg daily, will need to be increased after Depakote is weaned off in one week     Frequent falls - PT evaluation recommended skilled nursing facility  anemia of chronic disease - normocytic, chronic, currently at baseline    Hyperlipidemia - LDL 66, continue statin    History of recurrent deep vein thrombosis (DVT) Documented as on lifelong anticoagulation - discussed with pharmacy, the patient does not qualify for xarelto this time due to her renal insufficiency. - Started on warfarin, bridged with Lovenox until therapeutic INR    Closed traumatic displaced fracture of five ribs of right side - chest x-ray showed incidental finding of multiple rib fractures on the right 3-7, displaced with moderate right sided pleural effusion, no pneumothorax - patient  has been seen by trauma surgery,no surgery at this time - PT evaluation recommended skilled nursing facility    Hypertrophic obstructive cardiomyopathy (HOCM) (HCC) - reported in the past medical history, last 2-D echo in 2017 with grade 2 diastolic dysfunction - 2-D echo showed EF of 55-60% with no wall motion abnormalities    Type 2 diabetes mellitus with stage 3 chronic kidney disease (HCC) - continue sliding scale insulin, hemoglobin A1c 6.0    Developmental disability  - Currently in group home however now recommended skilled nursing facility for more supervision, rehab    Asthma - currently stable, continue nebulizers,    HTN  (hypertension) - Currently stable, will restart Lopressor 12.5 mg twice a day    Pleural effusion on right - currently stable, patient was on Lasix 20 mg daily prior to admission, restart home dose of Lasix - No shortness of breath    CKD (chronic kidney disease), stage III (HCC) - Baseline creatinine 1.6-1.7, currently stable at baseline  Code Status: full CODE STATUS DVT Prophylaxis:  Restart xarelto Family Communication: Discussed in detail with the patient, all imaging results, lab results explained to the patient , caregiver at the bedside  Disposition Plan: will need skilled nursing facility  Time Spent in minutes  25 minutes  Procedures:  MRI brain  Consultants:   neurology  Antimicrobials:      Medications  Scheduled Meds: . atorvastatin  40 mg Oral q1800  . budesonide  0.5 mg Nebulization BID  . buPROPion  150 mg Oral BID  . divalproex  500 mg Oral Daily  . enoxaparin (LOVENOX) injection  60 mg Subcutaneous Q24H  . FLUoxetine  40 mg Oral Daily  . fluticasone  2 spray Each Nare Daily  . lamoTRIgine  25 mg Oral Daily  . LORazepam  0.5 mg Oral TID  . montelukast  10 mg Oral QHS  . pantoprazole  40 mg Oral Daily  . warfarin  5 mg Oral ONCE-1800  . Warfarin - Pharmacist Dosing Inpatient   Does not apply q1800   Continuous Infusions: PRN Meds:.acetaminophen **OR** acetaminophen (TYLENOL) oral liquid 160 mg/5 mL **OR** acetaminophen, albuterol, morphine injection   Antibiotics   Anti-infectives    None        Subjective:   [name] was seen and examined today.   Currently no complaints. Patient denies dizziness, chest pain, shortness of breath, abdominal pain, N/V/D/C, new weakness, numbess, tingling. No acute events overnight.   Objective:   Vitals:   07/27/17 0129 07/27/17 0643 07/27/17 0913 07/27/17 1019  BP: (!) 148/85 130/90  140/76  Pulse: 80 82  90  Resp: Temp: 98.3 F (36.8 C) 98.2 F (36.8 C)  98.7 F (37.1 C)    TempSrc: Oral Oral  Oral  SpO2: 97% 94% 91% 96%  Weight:      Height:        Intake/Output Summary (Last 24 hours) at 07/27/17 1407 Last data filed at 07/27/17 0930  Gross per 24 hour  Intake               60 ml  Output                0 ml  Net               60 ml     Wt Readings from Last 3 Encounters:  07/25/17 60.8 kg (134 lb)  07/01/17 60.8 kg (134 lb)  05/09/17 62.9  kg (138 lb 9.6 oz)     Exam  General: Alert and oriented x self, NAD, resting tremorBaseline  Eyes:   HEENT:  Atraumatic, normocephalic  Cardiovascular: S1 S2 auscultated, Regular rate and rhythm. No pedal edema b/l  Respiratory: Clear to auscultation bilaterally, no wheezing, rales or rhonchi  Gastrointestinal: Soft, nontender, nondistended, + bowel sounds  Ext: no pedal edema bilaterally  Neuro: no new deficits, resting tremor  Musculoskeletal: No digital cyanosis, clubbing  Skin: No rashes  Psych: mental status at baseline, alert and oriented x3    Data Reviewed:  I have personally reviewed following labs and imaging studies  Micro Results No results found for this or any previous visit (from the past 240 hour(s)).  Radiology Reports Dg Chest 2 View  Result Date: 07/25/2017 CLINICAL DATA:  Weakness.  Ex-smoker. EXAM: CHEST  2 VIEW COMPARISON:  05/09/2017. FINDINGS: Interval significantly displaced fractures of the right third, fourth, fifth, sixth and seventh ribs posteriorly. Moderate-sized right pleural effusion and minimal left pleural effusion. Mild right basilar atelectasis and minimal left basilar atelectasis. Diffuse peribronchial thickening without significant change. No pneumothorax. Thoracic spine degenerative changes. IMPRESSION: 1. Interval displaced right third through seventh rib fractures. 2. Moderate-sized right pleural effusion and minimal left pleural effusion. 3. Mild right basilar atelectasis and minimal left basilar atelectasis. 4. Stable chronic bronchitic changes.  Electronically Signed   By: Beckie Salts M.D.   On: 07/25/2017 10:12   Dg Lumbar Spine Complete  Result Date: 07/03/2017 CLINICAL DATA:  Larey Seat 2 weeks ago, low back pain since that time EXAM: LUMBAR SPINE - COMPLETE 4+ VIEW COMPARISON:  Lumbar spine films of 08/25/2013 FINDINGS: There is no change alignment of the lumbar vertebrae with mild retrolistheses of L3 on L4, L4 on L5, and L5 on S1, as noted previously. Degenerative disc disease is noted diffusely. No interval compression deformity is seen. Abdominal aortic atherosclerosis is noted. The SI joints appear corticated. IMPRESSION: 1. No acute compression deformity. 2. No change in diffuse degenerative disc disease and mild retrolistheses as noted above. Electronically Signed   By: Dwyane Dee M.D.   On: 07/03/2017 08:16   Dg Pelvis 1-2 Views  Result Date: 07/03/2017 CLINICAL DATA:  Status post fall 2 weeks ago with generalize low back and pelvic pain. EXAM: PELVIS - 1-2 VIEW COMPARISON:  Pelvis series of August 25, 2013 FINDINGS: There is an abnormal appearance of the superior pubic ramus on the left consistent with a healing fracture a small amount of periosteal reaction is visible. A subtle lucency along the inferior aspect of the inferior pubic ramus could reflect a fracture but this is not a definite finding. The pubic bones on the right are intact. The iliac bones are intact. The observed portions of the sacrum are normal. There degenerative changes of the lumbar spine. The hip joint space on the right is mildly narrowed. IMPRESSION: Findings compatible with a subacute fracture of the left superior pubic ramus. I cannot exclude a nondisplaced fracture through the medial aspect of the left inferior pubic ramus. Elsewhere the pelvis appears intact. There is subtle lucency in the intertrochanteric region of the left hip. This was faintly visible on the previous study. A left femur series would be useful. Electronically Signed   By: David  [place] M.D.    On: 07/03/2017 08:15   Dg Forearm Right  Result Date: 07/04/2017 CLINICAL DATA:  Status post fall 2 weeks ago. EXAM: RIGHT FOREARM - 2 VIEW COMPARISON:  None. FINDINGS: There is no  evidence of fracture or other focal bone lesions. There is a K-wire transfixing the fourth metacarpal base extending into the hamate. Soft tissues are unremarkable. IMPRESSION: No acute osseous injury of the right forearm. Electronically Signed   By: Elige Ko   On: 07/04/2017 11:29   Dg Wrist 2 Views Left  Result Date: 07/03/2017 CLINICAL DATA:  Larey Seat 2 weeks ago with persistent left wrist pain EXAM: LEFT WRIST - 2 VIEW COMPARISON:  None. FINDINGS: The left radiocarpal joint space is mildly narrowed with only mild degenerative change. The carpal bones are in normal position with normal intercarpal joint spaces. No fracture is seen and no malalignment is noted. IMPRESSION: Only mild degenerative change.  No acute abnormality. Electronically Signed   By: Dwyane Dee M.D.   On: 07/03/2017 08:18   Ct Head Wo Contrast  Result Date: 07/25/2017 CLINICAL DATA:  Difficulty with ambulation.  Tremors. EXAM: CT HEAD WITHOUT CONTRAST TECHNIQUE: Contiguous axial images were obtained from the base of the skull through the vertex without intravenous contrast. COMPARISON:  May 20, 2016 FINDINGS: Brain: Mild diffuse atrophy is stable. There is no intracranial mass, hemorrhage, extra-axial fluid collection, or midline shift. There is patchy small vessel disease in the centra semiovale bilaterally. Elsewhere gray-white compartments are normal. No evident acute infarct. Vascular: No hyperdense vessel evident. There is calcification in each cavernous carotid artery region. Skull: Bony calvarium appears intact. Sinuses/Orbits: There is mucosal thickening in several ethmoid air cells. Other visualized paranasal sinuses are clear. Orbits appear symmetric bilaterally. Patient has had cataract removal bilaterally. Other: Postoperative change noted in  the left mastoid region. Aerated mastoid air cells bilaterally are clear. There is debris in each external auditory canal. IMPRESSION: 1. Mild atrophy with patchy periventricular small vessel disease, stable. No intracranial mass, hemorrhage, or extra-axial fluid collection. No evident acute infarct. 2.  Mild arterial vascular calcification noted. 3. Postoperative change left mastoid region. Aerated mastoid air cells are clear. 4.  Mucosal thickening in several ethmoid air cells. 5.  Probable cerumen in each external auditory canal. Electronically Signed   By: Bretta Bang III M.D.   On: 07/25/2017 10:09   Ct Chest Wo Contrast  Result Date: 07/25/2017 CLINICAL DATA:  Multiple right rib fractures falling a fall with a moderate-sized right pleural effusion. The patient is anticoagulated. Clinical concern for pneumothorax. EXAM: CT CHEST WITHOUT CONTRAST TECHNIQUE: Multidetector CT imaging of the chest was performed following the standard protocol without IV contrast. COMPARISON:  Chest radiographs obtained earlier today. FINDINGS: Cardiovascular: Atheromatous calcifications, including the coronary arteries and aorta. Normal sized heart. Minimal pericardial effusion with a maximum thickness of 4 mm. Mediastinum/Nodes: The right lobe of the thyroid gland is enlarged and extending posteriorly to the level of the spine. There is also a 9.5 mm nodule in the right lobe of the thyroid gland as well is a small or nodule. No enlarged lymph nodes. Lungs/Pleura: Moderately large right pleural effusion measuring 10 Hounsfield units in density. Minimal left pleural fluid. Compressive atelectasis of the right lower lobe. There is also dependent atelectasis in the left lower lobe and inferior aspect of the lingula. There is also mild dependent atelectasis in the right upper lobe. Small amount of patchy opacity in the medial aspect of the left upper lobe, anteriorly. No pneumothorax. Upper Abdomen: Abdominal aortic  calcifications. Musculoskeletal: Healing fracture of the distal right scapula, best demonstrated on the coronal images. Displaced right posterior third, fourth, fifth, sixth, seventh, eighth, ninth, tenth, eleventh rib fractures with partial  bridging callus formation. There are also right third, fourth, fifth, sixth, seventh posterolateral rib fractures with partially bridging callus. There are additional old, healed bilateral rib fractures. No vertebral fractures or subluxations are seen. Thoracic spine degenerative changes are noted. IMPRESSION: 1. Subacute right third through eleventh rib fractures with partial bridging callus formation. 2. Additional old, healed bilateral rib fractures. 3. No acute fractures seen today. 4. Subacute, healing distal right scapula fracture. 5. Moderately large, simple right pleural effusion. This does not have features of an acute hemothorax. 6. Minimal left pleural effusion. 7. Bilateral atelectasis. 8. Small amount of patchy atelectasis or pneumonia in the anteromedial aspect of the left upper lobe. 9. 9.5 mm and smaller right lobe thyroid nodules. These are too small to characterize, but most likely benign in the absence of known clinical risk factors for thyroid carcinoma. 10.  Calcific coronary artery and aortic atherosclerosis. 11. Minimal pericardial effusion. Aortic Atherosclerosis (ICD10-I70.0). Electronically Signed   By: Beckie Salts M.D.   On: 07/25/2017 17:32   Mr Brain Wo Contrast  Result Date: 07/25/2017 CLINICAL DATA:  Ataxia.  Difficulty walking. EXAM: MRI HEAD WITHOUT CONTRAST TECHNIQUE: Multiplanar, multiecho pulse sequences of the brain and surrounding structures were obtained without intravenous contrast. COMPARISON:  Head CT 07/25/2017 FINDINGS: The examination had to be discontinued prior to completion due to claustrophobia despite receiving medication. Axial coronal diffusion, axial T2, and axial FLAIR sequences were obtained and are mildly motion  degraded. Brain: There is a 2 mm focus of hyperintense signal in the superior right cerebellum on the axial diffusion sequence with corresponding reduced ADC, not confirmed on the coronal diffusion sequences though evaluation is limited by its small size and slice selection. There is no evidence of acute infarct elsewhere. No intracranial hemorrhage, mass, midline shift, or extra-axial fluid collection is identified. There is mild generalized cerebral atrophy for age. Periventricular white matter T2 hyperintensity is nonspecific but compatible with minimal chronic small vessel ischemic disease, not greater than expected for age. Vascular: Major intracranial vascular flow voids are preserved. Skull and upper cervical spine: No gross osseous lesion. Sinuses/Orbits: Bilateral cataract extraction. Paranasal sinuses and mastoid air cells are clear. Trace fluid in the new nasopharynx. Other: None. IMPRESSION: 1. Motion degraded, incomplete examination. 2. Suspected punctate acute infarct in the right cerebellum. Electronically Signed   By: Sebastian Ache M.D.   On: 07/25/2017 15:22   Dg Hand Complete Right  Result Date: 07/03/2017 CLINICAL DATA:  Larey Seat 2 weeks ago with right hand pain EXAM: RIGHT HAND - COMPLETE 3+ VIEW COMPARISON:  Right hand films of 02/13/2016 FINDINGS: K-wire remains within the base of the fifth metacarpal extending into the hamate bone. No acute fracture is seen. There are degenerative changes involving the DIP and to a lesser degree PIP joints diffusely. IMPRESSION: No acute fracture.  Degenerative and post traumatic change. Electronically Signed   By: Dwyane Dee M.D.   On: 07/03/2017 08:20    Lab Data:  CBC:  Recent Labs Lab 07/25/17 1015 07/26/17 0728 07/27/17 0409  WBC 6.2 4.4 4.7  HGB 10.2* 10.5* 9.9*  HCT 32.3* 32.5* 31.1*  MCV 94.7 94.5 94.0  PLT 130* 126* 120*   Basic Metabolic Panel:  Recent Labs Lab 07/25/17 1015  NA 141  K 4.4  CL 108  CO2 27  GLUCOSE 121*  BUN  30*  CREATININE 1.53*  CALCIUM 8.8*   GFR: Estimated Creatinine Clearance: 28.1 mL/min (A) (by C-G formula based on SCr of 1.53 mg/dL (H)). Liver  Function Tests: No results for input(s): AST, ALT, ALKPHOS, BILITOT, PROT, ALBUMIN in the last 168 hours. No results for input(s): LIPASE, AMYLASE in the last 168 hours. No results for input(s): AMMONIA in the last 168 hours. Coagulation Profile:  Recent Labs Lab 07/26/17 1145 07/27/17 0409  INR 1.06 1.12   Cardiac Enzymes: No results for input(s): CKTOTAL, CKMB, CKMBINDEX, TROPONINI in the last 168 hours. BNP (last 3 results) No results for input(s): PROBNP in the last 8760 hours. HbA1C:  Recent Labs  07/26/17 0728  HGBA1C 6.0*   CBG: No results for input(s): GLUCAP in the last 168 hours. Lipid Profile:  Recent Labs  07/26/17 0728  CHOL 125  HDL 46  LDLCALC 66  TRIG 64  CHOLHDL 2.7   Thyroid Function Tests: No results for input(s): TSH, T4TOTAL, FREET4, T3FREE, THYROIDAB in the last 72 hours. Anemia Panel: No results for input(s): VITAMINB12, FOLATE, FERRITIN, TIBC, IRON, RETICCTPCT in the last 72 hours. Urine analysis:    Component Value Date/Time   COLORURINE YELLOW 07/25/2017 1144   APPEARANCEUR HAZY (A) 07/25/2017 1144   LABSPEC 1.012 07/25/2017 1144   LABSPEC 1.015 07/01/2017 1018   PHURINE 6.0 07/25/2017 1144   GLUCOSEU NEGATIVE 07/25/2017 1144   HGBUR NEGATIVE 07/25/2017 1144   BILIRUBINUR NEGATIVE 07/25/2017 1144   BILIRUBINUR negative 07/01/2017 1018   BILIRUBINUR 1+ 12/02/2016 1204   KETONESUR NEGATIVE 07/25/2017 1144   PROTEINUR NEGATIVE 07/25/2017 1144   UROBILINOGEN negative 12/02/2016 1204   NITRITE NEGATIVE 07/25/2017 1144   LEUKOCYTESUR TRACE (A) 07/25/2017 1144     Quenisha Lovins M.D. [facility] 07/27/2017, 2:07 PM  Pager: [phone] Between 7am to 7pm - call Pager - [phone]  After 7pm go to [email or web address] - password [email or web address]  Call night coverage person covering after 7pm

## 2017-07-28 ENCOUNTER — Inpatient Hospital Stay (HOSPITAL_COMMUNITY): Payer: Medicare Other

## 2017-07-28 DIAGNOSIS — G894 Chronic pain syndrome: Secondary | ICD-10-CM

## 2017-07-28 LAB — PROTIME-INR
INR: 1.19
PROTHROMBIN TIME: 15 s (ref 11.4–15.2)

## 2017-07-28 LAB — CBC
HCT: 33.2 % — ABNORMAL LOW (ref 36.0–46.0)
HEMOGLOBIN: 10.4 g/dL — AB (ref 12.0–15.0)
MCH: 29.7 pg (ref 26.0–34.0)
MCHC: 31.3 g/dL (ref 30.0–36.0)
MCV: 94.9 fL (ref 78.0–100.0)
PLATELETS: 124 10*3/uL — AB (ref 150–400)
RBC: 3.5 MIL/uL — ABNORMAL LOW (ref 3.87–5.11)
RDW: 12.5 % (ref 11.5–15.5)
WBC: 4.4 10*3/uL (ref 4.0–10.5)

## 2017-07-28 LAB — VAS US CAROTID
LCCADDIAS: 7 cm/s
LCCAPDIAS: 11 cm/s
LICADDIAS: -11 cm/s
LICAPDIAS: -3 cm/s
LICAPSYS: -58 cm/s
Left CCA dist sys: 100 cm/s
Left CCA prox sys: 109 cm/s
Left ICA dist sys: -84 cm/s
RIGHT ECA DIAS: -3 cm/s
RIGHT VERTEBRAL DIAS: -16 cm/s
Right CCA prox dias: 4 cm/s
Right CCA prox sys: 112 cm/s

## 2017-07-28 MED ORDER — LAMOTRIGINE 25 MG PO TABS: 25.0000 mg | ORAL_TABLET | Freq: Every day | ORAL | Status: AC

## 2017-07-28 MED ORDER — ACETAMINOPHEN 325 MG PO TABS: 650.0000 mg | ORAL_TABLET | ORAL | Status: DC | PRN

## 2017-07-28 MED ORDER — ATORVASTATIN CALCIUM 40 MG PO TABS: 40.0000 mg | ORAL_TABLET | Freq: Every day | ORAL | Status: AC

## 2017-07-28 MED ORDER — DIVALPROEX SODIUM ER 500 MG PO TB24: 500.0000 mg | ORAL_TABLET | Freq: Every day | ORAL | Status: DC

## 2017-07-28 MED ORDER — HYDROCODONE-ACETAMINOPHEN 5-325 MG PO TABS: 1.0000 | ORAL_TABLET | Freq: Four times a day (QID) | ORAL | 0 refills | Status: DC | PRN

## 2017-07-28 MED ORDER — LORAZEPAM 0.5 MG PO TABS: 0.5000 mg | ORAL_TABLET | Freq: Three times a day (TID) | ORAL | 0 refills | Status: DC

## 2017-07-28 MED ORDER — WARFARIN SODIUM 7.5 MG PO TABS
7.5000 mg | ORAL_TABLET | Freq: Once | ORAL | Status: AC
  Administered 2017-07-28: 7.5 mg via ORAL
  Filled 2017-07-28: qty 1

## 2017-07-28 NOTE — Progress Notes (Signed)
[facility]                                                                              Patient Demographics  [name], is a 82 y.o. female, DOB - [birth year], ZOX:[id number]  Admit date - 07/25/2017   Admitting Physician Delano Metz, MD  Outpatient Primary MD for the patient is Tysinger, Kermit Balo, PA-C  Outpatient specialists:   LOS - 3  days   Medical records reviewed and are as summarized below:    Chief Complaint  Patient presents with  . Weakness       Brief summary   Per admit note by Dr. Arta Silence on 9/28 [name] is a 82 y.o. female with medical history significant for developmental disability, chronic anemia, history of recurrent DVT on chronic anticoagulation, asthma, diabetes, hypertension, dyslipidemia, HOCM and recent issues with recurrent falls. Group home reported the patient has had intermittent difficulty with ambulating and leaning to the right starting at noon on 9/27. Despite multiple attempts, patient unable to mobilize even with assistance in the ER and was very reluctant to get out of her wheelchair. According to her sister who is at the bedside (and who is also her legal guardian) patient has been having frequent falls at the facility. MR brain revealed suspected punctate acute infarct in the right cerebellum   Assessment & Plan    Principal Problem:  acute Cerebellar stroke [facility]) - patient sent from the group home with having difficulty ambulating, preferential right side positioning. MRI of the brain with suspected punctate acute infarcts in the right cerebellum - carotid Dopplers showed 1-39% ICA stenosis - PT evaluation recommended skilled nursing facility - neurology was consulted and do not strongly feel that it is responsible for patient's symptoms and possibly an incidental finding, patient is already on xarelto. 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 - Neurology recommended to consider weaning Depakote and replacing with lamotrigine - per neurology recommendations, Depakote decreased to 500 mg daily ( previously was on 500 mg twice a day), added lamotrigine 25 mg daily, will need to be increased after Depakote is weaned off in one week    Pleural effusion on right, rib fractures - patient was on Lasix 20 mg daily prior to admission, restart home dose of Lasix - No shortness of breath - Chest x-ray showed multiple right-sided rib fractures with large layering right-sided pleural effusion which is the same or slightly larger than on the prior exam, no pneumothorax - Obtain repeat chest x-ray, right lateral decubitus for the pleural effusion if needs thoracentesis    Frequent falls - PT evaluation recommended skilled nursing facility, currently awaiting facility  anemia of chronic disease - normocytic, chronic, currently at baseline    Hyperlipidemia - LDL 66, continue statin    History of recurrent  deep vein thrombosis (DVT) Documented as on lifelong anticoagulation - discussed with pharmacy, the patient does not qualify for xarelto this time due to her renal insufficiency. - Started on warfarin, bridged with Lovenox until therapeutic INR    Closed traumatic displaced fracture of five ribs of right side - chest x-ray showed incidental finding of multiple rib fractures on the right 3-7, displaced with moderate right sided pleural effusion, no pneumothorax - patient has been seen by trauma surgery,no surgery at this time - PT evaluation recommended skilled nursing facility    Hypertrophic obstructive cardiomyopathy (HOCM) (HCC) - reported in the past  medical history, last 2-D echo in 2017 with grade 2 diastolic dysfunction - 2-D echo showed EF of 55-60% with no wall motion abnormalities    Type 2 diabetes mellitus with stage 3 chronic kidney disease (HCC) - continue sliding scale insulin, hemoglobin A1c 6.0    Developmental disability  - Currently in group home however now recommended skilled nursing facility for more supervision, rehab    Asthma - currently stable, continue nebulizers,    HTN (hypertension) - Currently stable, will restart Lopressor 12.5 mg twice a day    CKD (chronic kidney disease), stage III (HCC) - Baseline creatinine 1.6-1.7, currently stable at baseline  Code Status: full CODE STATUS DVT Prophylaxis:  Restart xarelto Family Communication: Discussed in detail with the patient, all imaging results, lab results explained to the patient, caregiver at the bedside  Disposition Plan: will need skilled nursing facility  Time Spent in minutes  25 minutes  Procedures:  MRI brain  Consultants:   neurology  Antimicrobials:      Medications  Scheduled Meds: . atorvastatin  40 mg Oral q1800  . budesonide  0.5 mg Nebulization BID  . buPROPion  150 mg Oral BID  . divalproex  500 mg Oral Daily  . enoxaparin (LOVENOX) injection  60 mg Subcutaneous Q24H  . FLUoxetine  40 mg Oral Daily  . fluticasone  2 spray Each Nare Daily  . furosemide  20 mg Oral Daily  . lamoTRIgine  25 mg Oral Daily  . LORazepam  0.5 mg Oral TID  . montelukast  10 mg Oral QHS  . pantoprazole  40 mg Oral Daily  . warfarin  7.5 mg Oral ONCE-1800  . Warfarin - Pharmacist Dosing Inpatient   Does not apply q1800   Continuous Infusions: PRN Meds:.acetaminophen **OR** acetaminophen (TYLENOL) oral liquid 160 mg/5 mL **OR** acetaminophen, albuterol, morphine injection   Antibiotics   Anti-infectives    None        Subjective:   [name] was seen and examined today.  Denies any specific complaints, shortness of breath or  chest pain. No fevers or chills. Patient denies dizziness, chest pain, abdominal pain, N/V/D/C, new weakness, numbess, tingling. No acute events overnight.   Objective:   Vitals:   07/28/17 0516 07/28/17 0850 07/28/17 1003 07/28/17 1426  BP: (!) 108/45  (!) 118/91 127/88  Pulse: 74  93 97  Resp: 18  20 20   Temp: 98.6 F (37 C)  97.6 F (36.4 C) 98.3 F (36.8 C)  TempSrc: Oral  Oral Oral  SpO2: 95% 94% 94% 95%  Weight:      Height:        Intake/Output Summary (Last 24 hours) at 07/28/17 1734 Last data filed at 07/28/17 1310  Gross per 24 hour  Intake              480 ml  Output  0 ml  Net              480 ml     Wt Readings from Last 3 Encounters:  07/25/17 60.8 kg (134 lb)  07/01/17 60.8 kg (134 lb)  05/09/17 62.9 kg (138 lb 9.6 oz)     Exam   General: Alert and oriented x self, NAD, Answers yes and no questions  Eyes:   HEENT:  Atraumatic, normocephalic  Cardiovascular: S1 S2 auscultated, no rubs, murmurs or gallops. Regular rate and rhythm. No pedal edema b/l  Respiratory: Decreased breath sounds at the bases, rt > Lt  Gastrointestinal: Soft, nontender, nondistended, + bowel sounds  Ext: no pedal edema bilaterally  Neuro: resting tremors, no new deficits  Musculoskeletal: No digital cyanosis, clubbing  Skin: No rashes  Psych: mental status at baseline, alert and oriented x3    Data Reviewed:  I have personally reviewed following labs and imaging studies  Micro Results No results found for this or any previous visit (from the past 240 hour(s)).  Radiology Reports Dg Chest 2 View  Result Date: 07/25/2017 CLINICAL DATA:  Weakness.  Ex-smoker. EXAM: CHEST  2 VIEW COMPARISON:  05/09/2017. FINDINGS: Interval significantly displaced fractures of the right third, fourth, fifth, sixth and seventh ribs posteriorly. Moderate-sized right pleural effusion and minimal left pleural effusion. Mild right basilar atelectasis and minimal left basilar  atelectasis. Diffuse peribronchial thickening without significant change. No pneumothorax. Thoracic spine degenerative changes. IMPRESSION: 1. Interval displaced right third through seventh rib fractures. 2. Moderate-sized right pleural effusion and minimal left pleural effusion. 3. Mild right basilar atelectasis and minimal left basilar atelectasis. 4. Stable chronic bronchitic changes. Electronically Signed   By: Beckie Salts M.D.   On: 07/25/2017 10:12   Dg Lumbar Spine Complete  Result Date: 07/03/2017 CLINICAL DATA:  Larey Seat 2 weeks ago, low back pain since that time EXAM: LUMBAR SPINE - COMPLETE 4+ VIEW COMPARISON:  Lumbar spine films of 08/25/2013 FINDINGS: There is no change alignment of the lumbar vertebrae with mild retrolistheses of L3 on L4, L4 on L5, and L5 on S1, as noted previously. Degenerative disc disease is noted diffusely. No interval compression deformity is seen. Abdominal aortic atherosclerosis is noted. The SI joints appear corticated. IMPRESSION: 1. No acute compression deformity. 2. No change in diffuse degenerative disc disease and mild retrolistheses as noted above. Electronically Signed   By: Dwyane Dee M.D.   On: 07/03/2017 08:16   Dg Pelvis 1-2 Views  Result Date: 07/03/2017 CLINICAL DATA:  Status post fall 2 weeks ago with generalize low back and pelvic pain. EXAM: PELVIS - 1-2 VIEW COMPARISON:  Pelvis series of August 25, 2013 FINDINGS: There is an abnormal appearance of the superior pubic ramus on the left consistent with a healing fracture a small amount of periosteal reaction is visible. A subtle lucency along the inferior aspect of the inferior pubic ramus could reflect a fracture but this is not a definite finding. The pubic bones on the right are intact. The iliac bones are intact. The observed portions of the sacrum are normal. There degenerative changes of the lumbar spine. The hip joint space on the right is mildly narrowed. IMPRESSION: Findings compatible with a  subacute fracture of the left superior pubic ramus. I cannot exclude a nondisplaced fracture through the medial aspect of the left inferior pubic ramus. Elsewhere the pelvis appears intact. There is subtle lucency in the intertrochanteric region of the left hip. This was faintly visible on the previous  study. A left femur series would be useful. Electronically Signed   By: David  [place] M.D.   On: 07/03/2017 08:15   Dg Forearm Right  Result Date: 07/04/2017 CLINICAL DATA:  Status post fall 2 weeks ago. EXAM: RIGHT FOREARM - 2 VIEW COMPARISON:  None. FINDINGS: There is no evidence of fracture or other focal bone lesions. There is a K-wire transfixing the fourth metacarpal base extending into the hamate. Soft tissues are unremarkable. IMPRESSION: No acute osseous injury of the right forearm. Electronically Signed   By: Elige Ko   On: 07/04/2017 11:29   Dg Wrist 2 Views Left  Result Date: 07/03/2017 CLINICAL DATA:  Larey Seat 2 weeks ago with persistent left wrist pain EXAM: LEFT WRIST - 2 VIEW COMPARISON:  None. FINDINGS: The left radiocarpal joint space is mildly narrowed with only mild degenerative change. The carpal bones are in normal position with normal intercarpal joint spaces. No fracture is seen and no malalignment is noted. IMPRESSION: Only mild degenerative change.  No acute abnormality. Electronically Signed   By: Dwyane Dee M.D.   On: 07/03/2017 08:18   Ct Head Wo Contrast  Result Date: 07/25/2017 CLINICAL DATA:  Difficulty with ambulation.  Tremors. EXAM: CT HEAD WITHOUT CONTRAST TECHNIQUE: Contiguous axial images were obtained from the base of the skull through the vertex without intravenous contrast. COMPARISON:  May 20, 2016 FINDINGS: Brain: Mild diffuse atrophy is stable. There is no intracranial mass, hemorrhage, extra-axial fluid collection, or midline shift. There is patchy small vessel disease in the centra semiovale bilaterally. Elsewhere gray-white compartments are normal. No evident  acute infarct. Vascular: No hyperdense vessel evident. There is calcification in each cavernous carotid artery region. Skull: Bony calvarium appears intact. Sinuses/Orbits: There is mucosal thickening in several ethmoid air cells. Other visualized paranasal sinuses are clear. Orbits appear symmetric bilaterally. Patient has had cataract removal bilaterally. Other: Postoperative change noted in the left mastoid region. Aerated mastoid air cells bilaterally are clear. There is debris in each external auditory canal. IMPRESSION: 1. Mild atrophy with patchy periventricular small vessel disease, stable. No intracranial mass, hemorrhage, or extra-axial fluid collection. No evident acute infarct. 2.  Mild arterial vascular calcification noted. 3. Postoperative change left mastoid region. Aerated mastoid air cells are clear. 4.  Mucosal thickening in several ethmoid air cells. 5.  Probable cerumen in each external auditory canal. Electronically Signed   By: Bretta Bang III M.D.   On: 07/25/2017 10:09   Ct Chest Wo Contrast  Result Date: 07/25/2017 CLINICAL DATA:  Multiple right rib fractures falling a fall with a moderate-sized right pleural effusion. The patient is anticoagulated. Clinical concern for pneumothorax. EXAM: CT CHEST WITHOUT CONTRAST TECHNIQUE: Multidetector CT imaging of the chest was performed following the standard protocol without IV contrast. COMPARISON:  Chest radiographs obtained earlier today. FINDINGS: Cardiovascular: Atheromatous calcifications, including the coronary arteries and aorta. Normal sized heart. Minimal pericardial effusion with a maximum thickness of 4 mm. Mediastinum/Nodes: The right lobe of the thyroid gland is enlarged and extending posteriorly to the level of the spine. There is also a 9.5 mm nodule in the right lobe of the thyroid gland as well is a small or nodule. No enlarged lymph nodes. Lungs/Pleura: Moderately large right pleural effusion measuring 10 Hounsfield units  in density. Minimal left pleural fluid. Compressive atelectasis of the right lower lobe. There is also dependent atelectasis in the left lower lobe and inferior aspect of the lingula. There is also mild dependent atelectasis in the right upper  lobe. Small amount of patchy opacity in the medial aspect of the left upper lobe, anteriorly. No pneumothorax. Upper Abdomen: Abdominal aortic calcifications. Musculoskeletal: Healing fracture of the distal right scapula, best demonstrated on the coronal images. Displaced right posterior third, fourth, fifth, sixth, seventh, eighth, ninth, tenth, eleventh rib fractures with partial bridging callus formation. There are also right third, fourth, fifth, sixth, seventh posterolateral rib fractures with partially bridging callus. There are additional old, healed bilateral rib fractures. No vertebral fractures or subluxations are seen. Thoracic spine degenerative changes are noted. IMPRESSION: 1. Subacute right third through eleventh rib fractures with partial bridging callus formation. 2. Additional old, healed bilateral rib fractures. 3. No acute fractures seen today. 4. Subacute, healing distal right scapula fracture. 5. Moderately large, simple right pleural effusion. This does not have features of an acute hemothorax. 6. Minimal left pleural effusion. 7. Bilateral atelectasis. 8. Small amount of patchy atelectasis or pneumonia in the anteromedial aspect of the left upper lobe. 9. 9.5 mm and smaller right lobe thyroid nodules. These are too small to characterize, but most likely benign in the absence of known clinical risk factors for thyroid carcinoma. 10.  Calcific coronary artery and aortic atherosclerosis. 11. Minimal pericardial effusion. Aortic Atherosclerosis (ICD10-I70.0). Electronically Signed   By: Beckie Salts M.D.   On: 07/25/2017 17:32   Mr Brain Wo Contrast  Result Date: 07/25/2017 CLINICAL DATA:  Ataxia.  Difficulty walking. EXAM: MRI HEAD WITHOUT CONTRAST  TECHNIQUE: Multiplanar, multiecho pulse sequences of the brain and surrounding structures were obtained without intravenous contrast. COMPARISON:  Head CT 07/25/2017 FINDINGS: The examination had to be discontinued prior to completion due to claustrophobia despite receiving medication. Axial coronal diffusion, axial T2, and axial FLAIR sequences were obtained and are mildly motion degraded. Brain: There is a 2 mm focus of hyperintense signal in the superior right cerebellum on the axial diffusion sequence with corresponding reduced ADC, not confirmed on the coronal diffusion sequences though evaluation is limited by its small size and slice selection. There is no evidence of acute infarct elsewhere. No intracranial hemorrhage, mass, midline shift, or extra-axial fluid collection is identified. There is mild generalized cerebral atrophy for age. Periventricular white matter T2 hyperintensity is nonspecific but compatible with minimal chronic small vessel ischemic disease, not greater than expected for age. Vascular: Major intracranial vascular flow voids are preserved. Skull and upper cervical spine: No gross osseous lesion. Sinuses/Orbits: Bilateral cataract extraction. Paranasal sinuses and mastoid air cells are clear. Trace fluid in the new nasopharynx. Other: None. IMPRESSION: 1. Motion degraded, incomplete examination. 2. Suspected punctate acute infarct in the right cerebellum. Electronically Signed   By: Sebastian Ache M.D.   On: 07/25/2017 15:22   Dg Chest Port 1 View  Result Date: 07/28/2017 CLINICAL DATA:  82 year old female with right-sided rib fractures. Subsequent encounter. EXAM: PORTABLE CHEST 1 VIEW COMPARISON:  07/25/2017 CT and chest x-ray. FINDINGS: Multiple right-sided rib fractures with large layering right-sided pleural effusion which is the same or slightly larger than on the prior exam. Prominent skin folds without gross pneumothorax noted. Limited evaluation of the mediastinal and cardiac  silhouette. IMPRESSION: Multiple right-sided rib fractures with large layering right-sided pleural effusion which is the same or slightly larger than on the prior exam. Prominent skin folds without gross pneumothorax noted. Electronically Signed   By: Lacy Duverney M.D.   On: 07/28/2017 10:40   Dg Hand Complete Right  Result Date: 07/03/2017 CLINICAL DATA:  Larey Seat 2 weeks ago with right hand pain EXAM:  RIGHT HAND - COMPLETE 3+ VIEW COMPARISON:  Right hand films of 02/13/2016 FINDINGS: K-wire remains within the base of the fifth metacarpal extending into the hamate bone. No acute fracture is seen. There are degenerative changes involving the DIP and to a lesser degree PIP joints diffusely. IMPRESSION: No acute fracture.  Degenerative and post traumatic change. Electronically Signed   By: Dwyane Dee M.D.   On: 07/03/2017 08:20    Lab Data:  CBC:  Recent Labs Lab 07/25/17 1015 07/26/17 0728 07/27/17 0409 07/28/17 0458  WBC 6.2 4.4 4.7 4.4  HGB 10.2* 10.5* 9.9* 10.4*  HCT 32.3* 32.5* 31.1* 33.2*  MCV 94.7 94.5 94.0 94.9  PLT 130* 126* 120* 124*   Basic Metabolic Panel:  Recent Labs Lab 07/25/17 1015  NA 141  K 4.4  CL 108  CO2 27  GLUCOSE 121*  BUN 30*  CREATININE 1.53*  CALCIUM 8.8*   GFR: Estimated Creatinine Clearance: 28.1 mL/min (A) (by C-G formula based on SCr of 1.53 mg/dL (H)). Liver Function Tests: No results for input(s): AST, ALT, ALKPHOS, BILITOT, PROT, ALBUMIN in the last 168 hours. No results for input(s): LIPASE, AMYLASE in the last 168 hours. No results for input(s): AMMONIA in the last 168 hours. Coagulation Profile:  Recent Labs Lab 07/26/17 1145 07/27/17 0409 07/28/17 0458  INR 1.06 1.12 1.19   Cardiac Enzymes: No results for input(s): CKTOTAL, CKMB, CKMBINDEX, TROPONINI in the last 168 hours. BNP (last 3 results) No results for input(s): PROBNP in the last 8760 hours. HbA1C:  Recent Labs  07/26/17 0728  HGBA1C 6.0*   CBG: No results for  input(s): GLUCAP in the last 168 hours. Lipid Profile:  Recent Labs  07/26/17 0728  CHOL 125  HDL 46  LDLCALC 66  TRIG 64  CHOLHDL 2.7   Thyroid Function Tests: No results for input(s): TSH, T4TOTAL, FREET4, T3FREE, THYROIDAB in the last 72 hours. Anemia Panel: No results for input(s): VITAMINB12, FOLATE, FERRITIN, TIBC, IRON, RETICCTPCT in the last 72 hours. Urine analysis:    Component Value Date/Time   COLORURINE YELLOW 07/25/2017 1144   APPEARANCEUR HAZY (A) 07/25/2017 1144   LABSPEC 1.012 07/25/2017 1144   LABSPEC 1.015 07/01/2017 1018   PHURINE 6.0 07/25/2017 1144   GLUCOSEU NEGATIVE 07/25/2017 1144   HGBUR NEGATIVE 07/25/2017 1144   BILIRUBINUR NEGATIVE 07/25/2017 1144   BILIRUBINUR negative 07/01/2017 1018   BILIRUBINUR 1+ 12/02/2016 1204   KETONESUR NEGATIVE 07/25/2017 1144   PROTEINUR NEGATIVE 07/25/2017 1144   UROBILINOGEN negative 12/02/2016 1204   NITRITE NEGATIVE 07/25/2017 1144   LEUKOCYTESUR TRACE (A) 07/25/2017 1144     Marie Borowski M.D. [facility] 07/28/2017, 5:34 PM  Pager: [phone] Between 7am to 7pm - call Pager - [phone]  After 7pm go to [email or web address] - password [email or web address]  Call night coverage person covering after 7pm

## 2017-07-28 NOTE — [place] FL2 (Signed)
[place] MEDICAID FL2 LEVEL OF CARE SCREENING TOOL     IDENTIFICATION  Patient Name: [name] Birthdate: [birth year] Sex: female Admission Date (Current Location): 07/25/2017  [facility] and [place] Number:  Producer, television/film/video and Address:  [facility] [place], [address], [place], [place] [address]      Provider Number: [id number]  Attending Physician Name and Address:  Cathren Harsh, MD  Relative Name and Phone Number:  Aleksandria Frigo, [phone]    Current Level of Care: Hospital Recommended Level of Care: Skilled Nursing Facility Prior Approval Number:    Date Approved/Denied:   PASRR Number: Pending; manual review  Discharge Plan: SNF    Current Diagnoses: Patient Active Problem List   Diagnosis Date Noted  . Stroke-like episode (HCC) 07/25/2017  . Cerebellar stroke (HCC) 07/25/2017  . Anemia 07/25/2017  . Hyperlipidemia 07/25/2017  . Type 2 diabetes mellitus with stage 3 chronic kidney disease (HCC) 07/25/2017  . Developmental disability 07/25/2017  . Asthma 07/25/2017  . HTN (hypertension) 07/25/2017  . History of recurrent deep vein thrombosis (DVT) 07/25/2017  . Closed traumatic displaced fracture of five ribs of right side 07/25/2017  . Falls frequently 07/25/2017  . Pleural effusion on right 07/25/2017  . CKD (chronic kidney disease), stage III (HCC) 07/25/2017  . Acute back pain 07/01/2017  . Fall 07/01/2017  . History of recent fall 04/08/2017  . Abrasion of right arm 04/08/2017  . Chronic bilateral low back pain without sciatica 04/08/2017  . Arm contusion, right, initial encounter 04/08/2017  . Medicare annual wellness visit, subsequent 12/04/2016  . Decubitus ulcer of right buttock, stage 1 12/02/2016  . Weight loss 12/02/2016  . Anorexia 12/02/2016  . Acute cystitis without hematuria 08/16/2016  . Memory change 08/01/2016  . Need for prophylactic vaccination and inoculation against influenza 08/01/2016  . Encounter  for health maintenance examination in adult 09/11/2015  . Chronic pain syndrome 09/11/2015  . Type 2 diabetes mellitus with complication, without long-term current use of insulin (HCC) 09/11/2015  . Mild mental retardation 09/11/2015  . History of DVT (deep vein thrombosis) 09/11/2015  . Frequent falls 09/11/2015  . Need for prophylactic vaccination against Streptococcus pneumoniae (pneumococcus) 09/11/2015  . Gastroesophageal reflux disease without esophagitis 09/11/2015  . Peripheral vascular disease (HCC) 09/11/2015  . Impaired mobility 09/11/2015  . Chronic kidney disease 09/11/2015  . Senile purpura (HCC) 09/11/2015  . Moderate persistent asthma 07/11/2015  . Allergic rhinitis 05/31/2015  . Bronchiectasis without acute exacerbation (HCC) 11/29/2014  . Diabetes type 2, controlled (HCC) 09/09/2014  . Chronic kidney disease (CKD) 09/09/2014  . High risk medication use 09/09/2014  . Primary osteoarthritis of both knees 09/09/2014  . Anemia of chronic disease 09/09/2014  . Long term current use of anticoagulant therapy 09/09/2014  . Essential hypertension 09/09/2014  . Depression 09/09/2014  . Mood disorder in conditions classified elsewhere 09/09/2014  . Osteopenia 09/09/2014  . Abnormal involuntary movement 05/20/2014  . Hypertrophic obstructive cardiomyopathy (HOCM) (HCC) 10/28/2013    Orientation RESPIRATION BLADDER Height & Weight     Self, Place  Normal Incontinent Weight: 134 lb (60.8 kg) Height:  5\' 8"  (172.7 cm)  BEHAVIORAL SYMPTOMS/MOOD NEUROLOGICAL BOWEL NUTRITION STATUS      Incontinent Diet (heart room)  AMBULATORY STATUS COMMUNICATION OF NEEDS Skin   Extensive Assist Verbally (pt speech is impaired) Skin abrasions (small pink spot on her bottom.)  Personal Care Assistance Level of Assistance  Bathing, Feeding, Dressing Bathing Assistance: Limited assistance Feeding assistance: Limited assistance Dressing Assistance: Limited  assistance     Functional Limitations Info  Sight, Hearing, Speech Sight Info: Adequate Hearing Info: Adequate Speech Info: Impaired    SPECIAL CARE FACTORS FREQUENCY  PT (By licensed PT), OT (By licensed OT)     PT Frequency: 5x wk OT Frequency: 5x wk            Contractures Contractures Info: Not present    Additional Factors Info  Code Status, Allergies Code Status Info: Full Code Allergies Info: Levofloxacin           Current Medications (07/28/2017):  This is the current hospital active medication list Current Facility-Administered Medications  Medication Dose Route Frequency Provider Last Rate Last Dose  . acetaminophen (TYLENOL) tablet 650 mg  650 mg Oral Q4H PRN Russella Dar, NP   650 mg at 07/27/17 1730   Or  . acetaminophen (TYLENOL) solution 650 mg  650 mg Per Tube Q4H PRN Russella Dar, NP       Or  . acetaminophen (TYLENOL) suppository 650 mg  650 mg Rectal Q4H PRN Russella Dar, NP      . albuterol (PROVENTIL) (2.5 MG/3ML) 0.083% nebulizer solution 2.5 mg  2.5 mg Nebulization Q6H PRN Russella Dar, NP      . atorvastatin (LIPITOR) tablet 40 mg  40 mg Oral q1800 Rai, Ripudeep K, MD   40 mg at 07/27/17 1730  . budesonide (PULMICORT) nebulizer solution 0.5 mg  0.5 mg Nebulization BID Russella Dar, NP   0.5 mg at 07/28/17 0850  . buPROPion (WELLBUTRIN SR) 12 hr tablet 150 mg  150 mg Oral BID Russella Dar, NP   150 mg at 07/28/17 0905  . divalproex (DEPAKOTE ER) 24 hr tablet 500 mg  500 mg Oral Daily Rai, Ripudeep K, MD   500 mg at 07/28/17 0905  . enoxaparin (LOVENOX) injection 60 mg  60 mg Subcutaneous Q24H Rai, Ripudeep K, MD   60 mg at 07/27/17 1426  . FLUoxetine (PROZAC) capsule 40 mg  40 mg Oral Daily Delano Metz, MD   40 mg at 07/28/17 0905  . fluticasone (FLONASE) 50 MCG/ACT nasal spray 2 spray  2 spray Each Nare Daily Russella Dar, NP   2 spray at 07/28/17 0904  . furosemide (LASIX) tablet 20 mg  20 mg Oral Daily Rai,  Ripudeep K, MD   20 mg at 07/28/17 0905  . lamoTRIgine (LAMICTAL) tablet 25 mg  25 mg Oral Daily Rai, Ripudeep K, MD   25 mg at 07/28/17 0905  . LORazepam (ATIVAN) tablet 0.5 mg  0.5 mg Oral TID Russella Dar, NP   0.5 mg at 07/28/17 0905  . montelukast (SINGULAIR) tablet 10 mg  10 mg Oral QHS Russella Dar, NP   10 mg at 07/27/17 2120  . morphine 4 MG/ML injection 1-4 mg  1-4 mg Intravenous Q2H PRN Russella Dar, NP      . pantoprazole (PROTONIX) EC tablet 40 mg  40 mg Oral Daily Russella Dar, NP   40 mg at 07/28/17 0904  . warfarin (COUMADIN) tablet 7.5 mg  7.5 mg Oral ONCE-1800 Rai, Ripudeep K, MD      . Warfarin - Pharmacist Dosing Inpatient   Does not apply q1800 Rai, Delene Ruffini, MD         Discharge Medications: Please see discharge summary for  a list of discharge medications.  Relevant Imaging Results:  Relevant Lab Results:   Additional Information SS#[id number]  [place], [place]

## 2017-07-28 NOTE — Progress Notes (Signed)
ANTICOAGULATION CONSULT NOTE - Follow-up Consult  Pharmacy Consult for Warfarin/Lovenox Bridge Indication: stroke  Allergies  Allergen Reactions  . Levofloxacin Other (See Comments)    unknown    Patient Measurements: Height: _0  (172.7 cm) Weight: 134 lb (60.8 kg) IBW/kg (Calculated) : 63.9  Vital Signs: Temp: 98.6 F (37 C) (10/01 0516) Temp Source: Oral (10/01 0516) BP: 108/45 (10/01 0516) Pulse Rate: 74 (10/01 0516)  Labs:  Recent Labs  07/25/17 1015 07/26/17 0728 07/26/17 1145 07/27/17 0409 07/28/17 0458  HGB 10.2* 10.5*  --  9.9* 10.4*  HCT 32.3* 32.5*  --  31.1* 33.2*  PLT 130* 126*  --  120* 124*  LABPROT  --   --  13.7 14.3 15.0  INR  --   --  1.06 1.12 1.19  CREATININE 1.53*  --   --   --   --    Estimated Creatinine Clearance: 28.1 mL/min (A) (by C-G formula based on SCr of 1.53 mg/dL (H)).  Medical History: Past Medical History:  Diagnosis Date  . Allergy   . Anemia of chronic disease 10/2010   stable as of 2015, on iron therapy for mild iron deficiency, chronic kidney disease, anemia of chronic disease; Dr. Ralene Ok prior consult  . Chronic kidney disease   . Constipation    mild intermittent  . Depression    [facility] - NP Eugenie Norrie  . Diabetes mellitus   . Diabetes type 2, controlled ([place])   . Edentulous   . Falls   . GERD (gastroesophageal reflux disease)   . H/O cardiovascular stress test 06/2014   nuclear stress test normal; Dr. Wyatt Haste  . H/O echocardiogram 06/17/2014   mild LVH, EF 60-65%, no wall motion abnormalities // Echo 11/17: EF 55-60, normal wall motion, grade 2 diastolic dysfunction, MAC  . H/O mammogram 08/04/2014   normal  . History of MRI of brain and brain stem 11/2010   normal MRI of brain  . Hx of deep venous thrombosis    lifelong anticoagulant therapy  . Hyperlipidemia   . Hypertension   . Hypertrophic obstructive cardiomyopathy (HOCM) ([place]) 2015   normal echo and stress test other than mild  LVH 06/2014; Dr. Dorris Carnes  . Long term current use of anticoagulant therapy    due to hx/o recurrent DVT  . Mild mental retardation   . Moderate persistent asthma    asthma and bronchiectasis, pulm consult 2015; unable to do PFTs, 2015  . Mood disorder ([place])    [facility] - NP Eugenie Norrie  . Osteoarthritis of both knees    Dr. Frederik Pear  . Osteopenia 2013   improvements on Boniva and Ca+D from 2011-2013.  2013 Bone Density normal /improved; repeat Bone Density scan 2016  . PVD (peripheral vascular disease) ([place])   . Shortness of breath    06/2014 cardiac consult, SOB seems to be related to poor technique with handheld inhaler, switched to nebs with much improvement  . Thrombocytopenia ([place]) 10/2010   due to medications and immune dysregulation likely, stable as of 2015; no further investigation; hematology, Dr. Ralene Ok  . Tremor 2015   consult with Dr. Netta Neat Neurology.  Parkinsonian tremor without Parkinsons   Assessment: 74 yoF presenting with acute cerebellar stroke, on Xarelto PTA for history of DVT. Pharmacy consulted to start warfarin with lovenox bridge until therapeutic INR. INR this AM is 1.19. HgB stable 10.4, pltc stable 124. No bleeding noted.    Goal  of Therapy:  INR 2-3 Monitor platelets by anticoagulation protocol: Yes   Plan:  Warfarin 7.39m x1 tonight Continue lovenox 6103mSQ q24h Daily INR, CBC Monitor for s/sx of bleeding  ToGeorga BoraPharmD Clinical Pharmacist 07/28/2017 8:32 AM

## 2017-07-28 NOTE — Consult Note (Signed)
   [facility] CM Inpatient Consult   07/28/2017  ALANIE GOOSSEN Mar 31, 1935 [id number]  Alerted by Post Acute Navigator of patient.  Patient assessed for [facility] Care Management in the Medicare ACO.  Patient is previously from a group home and now with new acute infarct in the right cerebellum, patient is discharging to a skilled facility. No acute community follow up needs appropriate at this time.  For questions or referrals please contact:  Charlesetta Shanks, RN BSN CCM [facility]  [phone] business mobile phone Toll free office [phone]

## 2017-07-28 NOTE — Progress Notes (Signed)
  Subjective: "am I going home?", denies SOB  Objective: Vital signs in last 24 hours: Temp:  [97.4 F (36.3 C)-98.9 F (37.2 C)] 97.6 F (36.4 C) (10/01 1003) Pulse Rate:  [74-93] 93 (10/01 1003) Resp:  [18-20] 20 (10/01 1003) BP: (108-137)/(45-109) 118/91 (10/01 1003) SpO2:  [94 %-98 %] 94 % (10/01 1003) Last BM Date: 07/26/17  Intake/Output from previous day: 09/30 0701 - 10/01 0700 In: 180 [P.O.:180] Out: -  Intake/Output this shift: Total I/O In: 120 [P.O.:120] Out: -   General appearance: cooperative Resp: clear to auscultation bilaterally Chest wall: right sided chest wall tenderness Cardio: regular rate and rhythm Essential resting tremor  Lab Results: CBC   Recent Labs  07/27/17 0409 07/28/17 0458  WBC 4.7 4.4  HGB 9.9* 10.4*  HCT 31.1* 33.2*  PLT 120* 124*   BMET No results for input(s): NA, K, CL, CO2, GLUCOSE, BUN, CREATININE, CALCIUM in the last 72 hours. PT/INR  Recent Labs  07/27/17 0409 07/28/17 0458  LABPROT 14.3 15.0  INR 1.12 1.19   ABG No results for input(s): PHART, HCO3 in the last 72 hours.  Invalid input(s): PCO2, PO2  Studies/Results: No results found.  Anti-infectives: Anti-infectives    None      Assessment/Plan: Fall CVA - per primary R rib FX X 4 - doing well on RA, pulm toilet, multimodal pain control. Will check CXR now in light of anticaogulaiton to ensure no large HTX.    LOS: 3 days    Violeta Gelinas, MD, MPH, FACS Trauma: [phone] General Surgery: [phone]  10/1/2018Patient ID: [name], female   DOB: 1935/01/15, 82 y.o.   MRN: [id number]

## 2017-07-28 NOTE — Care Management Note (Signed)
Case Management Note  Patient Details  Name: [name] MRN: [id number] Date of Birth: 01-07-1935  Subjective/Objective: Pt admitted with CVA. She is from a group home.                   Action/Plan: PT/OT recommending SNF. CSW following. Also waiting on PASAR. CM following for d/c disposition.  Expected Discharge Date:  07/28/17               Expected Discharge Plan:  Skilled Nursing Facility  In-House Referral:  NA, Clinical Social Work  Discharge planning Services  CM Consult  Post Acute Care Choice:  Resumption of Svcs/PTA Provider Choice offered to:     DME Arranged:    DME Agency:     HH Arranged:    HH Agency:  [facility]  Status of Service:  In process, will continue to follow  If discussed at Long Length of Stay Meetings, dates discussed:    Additional Comments:  Kermit Balo, RN 07/28/2017, 12:35 PM

## 2017-07-28 NOTE — Progress Notes (Signed)
CSW following for discharge. Patient still does not have PASRR number; information was faxed this morning and remains under manual review.  CSW to follow up tomorrow if PASRR received to facilitate discharge.  Blenda Nicely, [place] Clinical Social Worker [phone]

## 2017-07-28 NOTE — Progress Notes (Addendum)
CSW contacted patient's sister to discuss bed offers; patient's sister requested placement at [facility]. CSW contacted [facility] representative to determine bed availability; waiting on a call back.  CSW faxed information to [facility] for manual review PASRR; awaiting to hear back.  CSW will update with information when available.  Laveda Abbe, LCSW Clinical Social Worker [phone]   UPDATE 2:30 PM:  CSW received update from [facility] representative that the facility has no beds available until tomorrow afternoon. CSW met with patient and patient's friend at bedside, Suzi Roots, to discuss additional bed offers and preferences. Patient's friend indicated preferences for [facility] or [facility], as well. [facility] indicated that they have a bed available today if patient can get PASRR back.   CSW still waiting on PASRR, will update when received.  Laveda Abbe,  Clinical Social Worker [phone]

## 2017-07-29 ENCOUNTER — Inpatient Hospital Stay (HOSPITAL_COMMUNITY): Payer: Medicare Other

## 2017-07-29 DIAGNOSIS — Y95 Nosocomial condition: Secondary | ICD-10-CM | POA: Diagnosis present

## 2017-07-29 DIAGNOSIS — G464 Cerebellar stroke syndrome: Secondary | ICD-10-CM | POA: Diagnosis not present

## 2017-07-29 DIAGNOSIS — R251 Tremor, unspecified: Secondary | ICD-10-CM | POA: Diagnosis present

## 2017-07-29 DIAGNOSIS — N183 Chronic kidney disease, stage 3 (moderate): Secondary | ICD-10-CM | POA: Diagnosis not present

## 2017-07-29 DIAGNOSIS — R498 Other voice and resonance disorders: Secondary | ICD-10-CM | POA: Diagnosis not present

## 2017-07-29 DIAGNOSIS — R1312 Dysphagia, oropharyngeal phase: Secondary | ICD-10-CM | POA: Diagnosis not present

## 2017-07-29 DIAGNOSIS — Z515 Encounter for palliative care: Secondary | ICD-10-CM | POA: Diagnosis not present

## 2017-07-29 DIAGNOSIS — J9601 Acute respiratory failure with hypoxia: Secondary | ICD-10-CM | POA: Diagnosis not present

## 2017-07-29 DIAGNOSIS — R41841 Cognitive communication deficit: Secondary | ICD-10-CM | POA: Diagnosis not present

## 2017-07-29 DIAGNOSIS — N189 Chronic kidney disease, unspecified: Secondary | ICD-10-CM | POA: Diagnosis not present

## 2017-07-29 DIAGNOSIS — Z66 Do not resuscitate: Secondary | ICD-10-CM | POA: Diagnosis present

## 2017-07-29 DIAGNOSIS — J189 Pneumonia, unspecified organism: Secondary | ICD-10-CM | POA: Diagnosis not present

## 2017-07-29 DIAGNOSIS — D649 Anemia, unspecified: Secondary | ICD-10-CM | POA: Diagnosis not present

## 2017-07-29 DIAGNOSIS — S2241XD Multiple fractures of ribs, right side, subsequent encounter for fracture with routine healing: Secondary | ICD-10-CM | POA: Diagnosis not present

## 2017-07-29 DIAGNOSIS — K219 Gastro-esophageal reflux disease without esophagitis: Secondary | ICD-10-CM | POA: Diagnosis not present

## 2017-07-29 DIAGNOSIS — J90 Pleural effusion, not elsewhere classified: Secondary | ICD-10-CM | POA: Diagnosis not present

## 2017-07-29 DIAGNOSIS — R296 Repeated falls: Secondary | ICD-10-CM | POA: Diagnosis not present

## 2017-07-29 DIAGNOSIS — I421 Obstructive hypertrophic cardiomyopathy: Secondary | ICD-10-CM | POA: Diagnosis not present

## 2017-07-29 DIAGNOSIS — J440 Chronic obstructive pulmonary disease with acute lower respiratory infection: Secondary | ICD-10-CM | POA: Diagnosis present

## 2017-07-29 DIAGNOSIS — J45909 Unspecified asthma, uncomplicated: Secondary | ICD-10-CM | POA: Diagnosis not present

## 2017-07-29 DIAGNOSIS — E1122 Type 2 diabetes mellitus with diabetic chronic kidney disease: Secondary | ICD-10-CM | POA: Diagnosis not present

## 2017-07-29 DIAGNOSIS — J4540 Moderate persistent asthma, uncomplicated: Secondary | ICD-10-CM | POA: Diagnosis not present

## 2017-07-29 DIAGNOSIS — R509 Fever, unspecified: Secondary | ICD-10-CM | POA: Diagnosis not present

## 2017-07-29 DIAGNOSIS — R293 Abnormal posture: Secondary | ICD-10-CM | POA: Diagnosis not present

## 2017-07-29 DIAGNOSIS — J918 Pleural effusion in other conditions classified elsewhere: Secondary | ICD-10-CM | POA: Diagnosis not present

## 2017-07-29 DIAGNOSIS — F70 Mild intellectual disabilities: Secondary | ICD-10-CM | POA: Diagnosis not present

## 2017-07-29 DIAGNOSIS — A419 Sepsis, unspecified organism: Secondary | ICD-10-CM | POA: Diagnosis present

## 2017-07-29 DIAGNOSIS — R278 Other lack of coordination: Secondary | ICD-10-CM | POA: Diagnosis not present

## 2017-07-29 DIAGNOSIS — I10 Essential (primary) hypertension: Secondary | ICD-10-CM | POA: Diagnosis not present

## 2017-07-29 DIAGNOSIS — R259 Unspecified abnormal involuntary movements: Secondary | ICD-10-CM | POA: Diagnosis not present

## 2017-07-29 DIAGNOSIS — L89311 Pressure ulcer of right buttock, stage 1: Secondary | ICD-10-CM | POA: Diagnosis not present

## 2017-07-29 DIAGNOSIS — Z87891 Personal history of nicotine dependence: Secondary | ICD-10-CM | POA: Diagnosis not present

## 2017-07-29 DIAGNOSIS — Z90710 Acquired absence of both cervix and uterus: Secondary | ICD-10-CM | POA: Diagnosis not present

## 2017-07-29 DIAGNOSIS — E119 Type 2 diabetes mellitus without complications: Secondary | ICD-10-CM | POA: Diagnosis not present

## 2017-07-29 DIAGNOSIS — I129 Hypertensive chronic kidney disease with stage 1 through stage 4 chronic kidney disease, or unspecified chronic kidney disease: Secondary | ICD-10-CM | POA: Diagnosis present

## 2017-07-29 DIAGNOSIS — G894 Chronic pain syndrome: Secondary | ICD-10-CM | POA: Diagnosis not present

## 2017-07-29 DIAGNOSIS — Z8673 Personal history of transient ischemic attack (TIA), and cerebral infarction without residual deficits: Secondary | ICD-10-CM | POA: Diagnosis not present

## 2017-07-29 DIAGNOSIS — S2241XA Multiple fractures of ribs, right side, initial encounter for closed fracture: Secondary | ICD-10-CM | POA: Diagnosis not present

## 2017-07-29 DIAGNOSIS — Z7901 Long term (current) use of anticoagulants: Secondary | ICD-10-CM | POA: Diagnosis not present

## 2017-07-29 DIAGNOSIS — E785 Hyperlipidemia, unspecified: Secondary | ICD-10-CM | POA: Diagnosis present

## 2017-07-29 DIAGNOSIS — Z86718 Personal history of other venous thrombosis and embolism: Secondary | ICD-10-CM | POA: Diagnosis not present

## 2017-07-29 LAB — CBC
HCT: 31.9 % — ABNORMAL LOW (ref 36.0–46.0)
Hemoglobin: 10.1 g/dL — ABNORMAL LOW (ref 12.0–15.0)
MCH: 30 pg (ref 26.0–34.0)
MCHC: 31.7 g/dL (ref 30.0–36.0)
MCV: 94.7 fL (ref 78.0–100.0)
PLATELETS: 131 10*3/uL — AB (ref 150–400)
RBC: 3.37 MIL/uL — ABNORMAL LOW (ref 3.87–5.11)
RDW: 12.5 % (ref 11.5–15.5)
WBC: 5.5 10*3/uL (ref 4.0–10.5)

## 2017-07-29 LAB — BASIC METABOLIC PANEL
Anion gap: 4 — ABNORMAL LOW (ref 5–15)
BUN: 25 mg/dL — ABNORMAL HIGH (ref 6–20)
CALCIUM: 8 mg/dL — AB (ref 8.9–10.3)
CO2: 29 mmol/L (ref 22–32)
Chloride: 107 mmol/L (ref 101–111)
Creatinine, Ser: 1.48 mg/dL — ABNORMAL HIGH (ref 0.44–1.00)
GFR calc Af Amer: 37 mL/min — ABNORMAL LOW (ref >60)
GFR, EST NON AFRICAN AMERICAN: 32 mL/min — AB (ref >60)
GLUCOSE: 110 mg/dL — AB (ref 65–99)
POTASSIUM: 4.2 mmol/L (ref 3.5–5.1)
Sodium: 140 mmol/L (ref 135–145)

## 2017-07-29 LAB — PROTIME-INR
INR: 1.48
Prothrombin Time: 17.8 seconds — ABNORMAL HIGH (ref 11.4–15.2)

## 2017-07-29 MED ORDER — METOCLOPRAMIDE HCL 5 MG PO TABS: 2.5000 mg | ORAL_TABLET | Freq: Every day | ORAL | Status: AC

## 2017-07-29 MED ORDER — ENOXAPARIN SODIUM 60 MG/0.6ML ~~LOC~~ SOLN: 60.0000 mg | SUBCUTANEOUS | Status: AC

## 2017-07-29 MED ORDER — WARFARIN SODIUM 5 MG PO TABS: 5.0000 mg | ORAL_TABLET | Freq: Every day | ORAL | Status: DC

## 2017-07-29 MED ORDER — DIVALPROEX SODIUM ER 500 MG PO TB24: 500.0000 mg | ORAL_TABLET | Freq: Every day | ORAL | Status: DC

## 2017-07-29 MED ORDER — WARFARIN SODIUM 5 MG PO TABS: 5.0000 mg | ORAL_TABLET | Freq: Once | ORAL | Status: DC

## 2017-07-29 NOTE — Progress Notes (Signed)
Trauma Service Note  Subjective: Patient awake and cooperative.  No acute distress  Objective: Vital signs in last 24 hours: Temp:  [97.6 F (36.4 C)-100.3 F (37.9 C)] 98 F (36.7 C) (10/02 0603) Pulse Rate:  [83-97] 83 (10/02 0126) Resp:  [20] 20 (10/02 0603) BP: (118-135)/(53-91) 135/64 (10/02 0603) SpO2:  [90 %-96 %] 96 % (10/02 0603) Last BM Date: 07/26/17  Intake/Output from previous day: 10/01 0701 - 10/02 0700 In: 360 [P.O.:360] Out: -  Intake/Output this shift: No intake/output data recorded.  General: No distress  Lungs: Diminished breath sounds in the right base.  CXR shows slightly increased effusion and possibly atelectasis on the right, but not very significant.  Oxygen saturations 96% on room air. Not short of breath  Abd: soft, benign  Extremities: No changes  Neuro:  Resting tremor exists  Lab Results: CBC   Recent Labs  07/28/17 0458 07/29/17 0456  WBC 4.4 5.5  HGB 10.4* 10.1*  HCT 33.2* 31.9*  PLT 124* 131*   BMET  Recent Labs  07/29/17 0456  NA 140  K 4.2  CL 107  CO2 29  GLUCOSE 110*  BUN 25*  CREATININE 1.48*  CALCIUM 8.0*   PT/INR  Recent Labs  07/28/17 0458 07/29/17 0456  LABPROT 15.0 17.8*  INR 1.19 1.48   ABG No results for input(s): PHART, HCO3 in the last 72 hours.  Invalid input(s): PCO2, PO2  Studies/Results: Dg Chest Port 1 View  Result Date: 07/28/2017 CLINICAL DATA:  82 year old female with right-sided rib fractures. Subsequent encounter. EXAM: PORTABLE CHEST 1 VIEW COMPARISON:  07/25/2017 CT and chest x-ray. FINDINGS: Multiple right-sided rib fractures with large layering right-sided pleural effusion which is the same or slightly larger than on the prior exam. Prominent skin folds without gross pneumothorax noted. Limited evaluation of the mediastinal and cardiac silhouette. IMPRESSION: Multiple right-sided rib fractures with large layering right-sided pleural effusion which is the same or slightly larger  than on the prior exam. Prominent skin folds without gross pneumothorax noted. Electronically Signed   By: Lacy Duverney M.D.   On: 07/28/2017 10:40    Anti-infectives: Anti-infectives    None      Assessment/Plan: s/p  Hemoglobin stable  CXR not significantly changed, however if US guided thoracentesis is to be considered, her INR is only 1.48. I would not recommend thoracentesis at this time.  LOS: 4 days   Marta Lamas. Gae Bon, MD, FACS [phone] Trauma Surgeon 07/29/2017

## 2017-07-29 NOTE — Progress Notes (Signed)
CSW following for discharge. Patient has received PASRR number and has bed available at [facility], if medically ready for discharge. CSW will alert MD.  CSW to follow.  Blenda Nicely, [place] Clinical Social Worker [phone]

## 2017-07-29 NOTE — Progress Notes (Signed)
[facility] arrived to transport patient, sister called to notify as requested. Attempted Repor to [facility] X2

## 2017-07-29 NOTE — Care Management Important Message (Signed)
Important Message  Patient Details  Name: [name] MRN: [id number] Date of Birth: 08/31/1935   Medicare Important Message Given:  Yes    [name] 07/29/2017, 1:06 PM

## 2017-07-29 NOTE — Progress Notes (Signed)
ANTICOAGULATION CONSULT NOTE - Follow-up Consult  Pharmacy Consult for Warfarin/Lovenox Bridge Indication: stroke  Allergies  Allergen Reactions  . Levofloxacin Other (See Comments)    unknown   Patient Measurements: Height: _0  (172.7 cm) Weight: 134 lb (60.8 kg) IBW/kg (Calculated) : 63.9  Vital Signs: Temp: 98 F (36.7 C) (10/02 0603) Temp Source: Oral (10/02 0603) BP: 135/64 (10/02 0603) Pulse Rate: 83 (10/02 0126)  Labs:  Recent Labs  07/27/17 0409 07/28/17 0458 07/29/17 0456  HGB 9.9* 10.4* 10.1*  HCT 31.1* 33.2* 31.9*  PLT 120* 124* 131*  LABPROT 14.3 15.0 17.8*  INR 1.12 1.19 1.48  CREATININE  --   --  1.48*   Estimated Creatinine Clearance: 29.1 mL/min (A) (by C-G formula based on SCr of 1.48 mg/dL (H)).  Medical History: Past Medical History:  Diagnosis Date  . Allergy   . Anemia of chronic disease 10/2010   stable as of 2015, on iron therapy for mild iron deficiency, chronic kidney disease, anemia of chronic disease; Dr. Ralene Ok prior consult  . Chronic kidney disease   . Constipation    mild intermittent  . Depression    [facility] - NP Eugenie Norrie  . Diabetes mellitus   . Diabetes type 2, controlled ([place])   . Edentulous   . Falls   . GERD (gastroesophageal reflux disease)   . H/O cardiovascular stress test 06/2014   nuclear stress test normal; Dr. Wyatt Haste  . H/O echocardiogram 06/17/2014   mild LVH, EF 60-65%, no wall motion abnormalities // Echo 11/17: EF 55-60, normal wall motion, grade 2 diastolic dysfunction, MAC  . H/O mammogram 08/04/2014   normal  . History of MRI of brain and brain stem 11/2010   normal MRI of brain  . Hx of deep venous thrombosis    lifelong anticoagulant therapy  . Hyperlipidemia   . Hypertension   . Hypertrophic obstructive cardiomyopathy (HOCM) ([place]) 2015   normal echo and stress test other than mild LVH 06/2014; Dr. Dorris Carnes  . Long term current use of anticoagulant therapy    due to hx/o  recurrent DVT  . Mild mental retardation   . Moderate persistent asthma    asthma and bronchiectasis, pulm consult 2015; unable to do PFTs, 2015  . Mood disorder ([place])    [facility] NP Eugenie Norrie  . Osteoarthritis of both knees    Dr. Frederik Pear  . Osteopenia 2013   improvements on Boniva and Ca+D from 2011-2013.  2013 Bone Density normal /improved; repeat Bone Density scan 2016  . PVD (peripheral vascular disease) ([place])   . Shortness of breath    06/2014 cardiac consult, SOB seems to be related to poor technique with handheld inhaler, switched to nebs with much improvement  . Thrombocytopenia ([place]) 10/2010   due to medications and immune dysregulation likely, stable as of 2015; no further investigation; hematology, Dr. Ralene Ok  . Tremor 2015   consult with Dr. Netta Neat Neurology.  Parkinsonian tremor without Parkinsons   Assessment: 14 yoF presenting with acute cerebellar stroke, on Xarelto PTA for history of DVT. Pharmacy consulted to start warfarin with lovenox bridge until therapeutic INR. INR trending up 1.19>>1.48. HgB stable ~10, pltc low stable 131. No bleeding noted.    Goal of Therapy:  INR 2-3 Monitor platelets by anticoagulation protocol: Yes   Plan:  Warfarin 80m x1 tonight Continue lovenox 649mSQ q24h Daily INR, CBC Monitor for s/sx of bleeding  ToGeorga Bora  PharmD Clinical Pharmacist 07/29/2017 9:37 AM

## 2017-07-29 NOTE — Discharge Summary (Signed)
Physician Discharge Summary   Patient ID: [name] MRN: [id number] DOB/AGE: Oct 10, 1935 82 y.o.  Admit date: 07/25/2017 Discharge date: 07/29/2017  Primary Care Physician:  Renee Canavan, PA-C  Discharge Diagnoses:    . Cerebellar stroke (HCC) . Anemia . Hyperlipidemia . Hypertrophic obstructive cardiomyopathy (HOCM) (HCC) . Developmental disability . Asthma . HTN (hypertension) . Closed traumatic displaced fracture of five ribs of right side . Pleural effusion on right . CKD (chronic kidney disease), stage III (HCC) . Abnormal involuntary movement . Type 2 diabetes mellitus with stage 3 chronic kidney disease (HCC)   Consults:  Neurology  Recommendations for Outpatient Follow-up:  1. Per neurology, Depakote decreased to 500 mg daily, continue for 2 more days then wean off or decrease to 250 mg daily. Lamotrigine 25 mg daily was added, increase dose after Depakote is weaned off 2. PT/INR on 07/31/17 or 10/5. If INR above 2, discontinue Lovenox and continue Coumadin 3. Please repeat CBC/BMET at next visit   DIET: heart healthy diet     Allergies:   Allergies  Allergen Reactions  . Levofloxacin Other (See Comments)    unknown     DISCHARGE MEDICATIONS: Current Discharge Medication List    START taking these medications   Details  acetaminophen (TYLENOL) 325 MG tablet Take 2 tablets (650 mg total) by mouth every 4 (four) hours as needed for mild pain (or temp > 37.5 C (99.5 F)).    atorvastatin (LIPITOR) 40 MG tablet Take 1 tablet (40 mg total) by mouth daily at 6 PM.    enoxaparin (LOVENOX) 60 MG/0.6ML injection Inject 0.6 mLs (60 mg total) into the skin daily. Until INR above 2, bridge with Coumadin Qty: 0 Syringe    lamoTRIgine (LAMICTAL) 25 MG tablet Take 1 tablet (25 mg total) by mouth daily.    warfarin (COUMADIN) 5 MG tablet Take 1 tablet (5 mg total) by mouth daily at 6 PM. Dose to be adjusted according to PT/INR      CONTINUE these  medications which have CHANGED   Details  divalproex (DEPAKOTE ER) 500 MG 24 hr tablet Take 1 tablet (500 mg total) by mouth daily. For 2 more days, then wean off    HYDROcodone-acetaminophen (NORCO) 5-325 MG tablet Take 1 tablet by mouth every 6 (six) hours as needed for moderate pain. Pt receives at 8am and 9pm Qty: 10 tablet, Refills: 0   Associated Diagnoses: Chronic pain syndrome    LORazepam (ATIVAN) 0.5 MG tablet Take 1 tablet (0.5 mg total) by mouth 3 (three) times daily. And as needed Qty: 10 tablet, Refills: 0    metoCLOPramide (REGLAN) 5 MG tablet Take 0.5 tablets (2.5 mg total) by mouth daily.      CONTINUE these medications which have NOT CHANGED   Details  albuterol (PROVENTIL) (2.5 MG/3ML) 0.083% nebulizer solution Take 3 mLs (2.5 mg total) by nebulization every 6 (six) hours as needed for wheezing or shortness of breath (dx J47.9    I42.2). Qty: 360 mL, Refills: 2    budesonide (PULMICORT) 0.5 MG/2ML nebulizer solution TAKE 2 ml (0.5mg ) BY NEBULIZER TWICE DAILY Qty: 120 mL, Refills: 11   Associated Diagnoses: Moderate persistent asthma, uncomplicated    buPROPion (WELLBUTRIN SR) 150 MG 12 hr tablet Take 150 mg by mouth 2 (two) times daily.    Calcium Carbonate-Vitamin D3 (CALCIUM 600-D) 600-400 MG-UNIT TABS Take 2 tablets by mouth every evening.    docusate sodium (COLACE) 100 MG capsule Take 1 capsule (100 mg total)  by mouth 2 (two) times daily. Prn Qty: 60 capsule, Refills: 11    ENSURE PLUS (ENSURE PLUS) LIQD Take 237 mLs by mouth 3 (three) times daily as needed (in between meals).    FLUoxetine (PROZAC) 40 MG capsule Take 40 mg by mouth daily.      fluticasone (FLONASE) 50 MCG/ACT nasal spray Place 2 sprays into both nostrils daily. Qty: 16 g, Refills: 11    furosemide (LASIX) 20 MG tablet Take 1 tablet (20 mg total) by mouth daily. Qty: 90 tablet, Refills: 3    glucose blood (ACCU-CHEK AVIVA PLUS) test strip CHECK BLOOD GLUCOSE (SUGAR) 1 OR 2 TIMESA  WEEK Qty: 100 each, Refills: 2    hydroxypropyl methylcellulose / hypromellose (ISOPTO TEARS / GONIOVISC) 2.5 % ophthalmic solution Place 1 drop into both eyes 4 (four) times daily.    isosorbide mononitrate (IMDUR) 30 MG 24 hr tablet Take 1 tablet (30 mg total) by mouth daily. Qty: 90 tablet, Refills: 3   Associated Diagnoses: Hypertrophic cardiomyopathy (HCC)    loratadine (QC LORATADINE ALLERGY RELIEF) 10 MG tablet Take 1 tablet (10 mg total) by mouth daily. Qty: 90 tablet, Refills: 3    metoprolol tartrate (LOPRESSOR) 25 MG tablet Take 0.5 tablets (12.5 mg total) by mouth 2 (two) times daily. Qty: 180 tablet, Refills: 3    montelukast (SINGULAIR) 10 MG tablet Take 1 tablet (10 mg total) by mouth at bedtime. Qty: 90 tablet, Refills: 3    Multiple Vitamin (MULTIVITAMIN) tablet Take 1 tablet by mouth daily. Qty: 90 tablet, Refills: 3    omeprazole (PRILOSEC) 20 MG capsule Take 1 capsule (20 mg total) by mouth daily. Qty: 90 capsule, Refills: 3    PRODIGY TWIST TOP LANCETS 28G MISC TWICE weekly Qty: 100 each, Refills: 2    PROLIA 60 MG/ML SOLN injection 1 mL by Subconjunctival route every 6 (six) months.    rivaroxaban (XARELTO) 20 MG TABS tablet Take 1 tablet (20 mg total) by mouth daily with supper. Qty: 90 tablet, Refills: 3   Associated Diagnoses: History of DVT (deep vein thrombosis)      STOP taking these medications     potassium chloride (K-DUR) 10 MEQ tablet      simvastatin (ZOCOR) 20 MG tablet      Calcium Carbonate-Vit D-Min (CALCIUM 1200) 1200-1000 MG-UNIT CHEW      clarithromycin (BIAXIN) 500 MG tablet      divalproex (DEPAKOTE) 250 MG DR tablet      ferrous sulfate 325 (65 FE) MG tablet      predniSONE (DELTASONE) 10 MG tablet      silver sulfADIAZINE (SILVADENE) 1 % cream          Brief H and P: For complete details please refer to admission H and P, but in brief Per admit note by Dr. Arta Ford on 9/28 [name] a 82 y.o.femalewith  medical history significant for developmental disability, chronic anemia, history of recurrent DVT on chronic anticoagulation, asthma, diabetes, hypertension, dyslipidemia, HOCMand recent issues with recurrent falls. Group home reported the patient has had intermittent difficulty with ambulating and leaning to the right starting at noon on 9/27. Despite multiple attempts,patient unable to mobilize even with assistance in the ER and was very reluctant to get out of her wheelchair. According to her sister who is at the bedside (and whois also her legal guardian)patient has been having frequent falls at the facility. MR brain revealed suspected punctate acute infarct in the right cerebellum.  Hospital Course:  Acute Cerebellar stroke (HCC) - patient sent from the group home with having difficulty ambulating, preferential right side positioning. MRI of the brain with suspected punctate acute infarcts in the right cerebellum - carotid Dopplers showed 1-39% ICA stenosis - PT evaluation recommended skilled nursing facility - neurology was consulted and do not strongly feel that it is responsible for patient's symptoms and possibly an incidental finding, patient is already on xarelto. 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     Pleural effusion on right, rib fractures -  patient was on Lasix 20 mg daily prior to admission, restarted home dose of Lasix - No shortness of breath - Chest x-ray showed multiple right-sided rib fractures with large layering right-sided pleural effusion which is the same or slightly larger than on the prior exam, no pneumothorax -Chest x-ray repeated this morning not significantly changed, patient was followed closely by trauma service did not recommend thoracentesis at this time    Frequent falls - PT evaluation recommended skilled nursing facility  anemia of chronic disease - normocytic, chronic, currently at baseline    Hyperlipidemia - LDL 66, continue statin    History of recurrent deep vein thrombosis (DVT) Documented as on lifelong anticoagulation - discussed with pharmacy, the patient does not qualify for xarelto this time due to her renal insufficiency. - Started on warfarin, bridged with Lovenox until therapeutic INR.     Closed traumatic displaced fracture of five ribs of right side - chest x-ray showed incidental finding of multiple rib fractures on the right 3-7, displaced with moderate right sided pleural effusion, no pneumothorax - patient has been seen by trauma surgery,no surgery at this time - PT evaluation recommended skilled nursing facility    Hypertrophic obstructive cardiomyopathy (HOCM) (HCC) - reported in the past medical history, last 2-D echo in 2017 with grade 2 diastolic dysfunction - 2-D echo showed EF of 55-60% with no wall motion abnormalities    Type 2 diabetes mellitus with stage 3 chronic kidney disease (HCC) - continue sliding scale insulin, hemoglobin A1c 6.0    Developmental disability  - Currently in group home however now recommended skilled nursing facility for more supervision, rehab    Asthma - currently stable, continue nebulizers,    HTN (hypertension) - Currently stable, will restart Lopressor 12.5 mg twice a day    CKD (chronic kidney disease), stage III  (HCC) - Baseline creatinine 1.6-1.7, currently stable at baseline  Day of Discharge BP 129/64 (BP Location: Left Arm)   Pulse 85   Temp 98.4 F (36.9 C) (Oral)   Resp 20   Ht 5\' 8"  (1.727 m)   Wt 60.8 kg (134 lb)   SpO2 98%   BMI 20.37 kg/m   Physical Exam: General: Alert and awake oriented x self, not in any acute distress. Follows commands HEENT: anicteric sclera, pupils reactive to light and accommodation CVS: S1-S2 clear no murmur rubs or  gallops Chest: clear to auscultation bilaterally, no wheezing rales or rhonchi Abdomen: soft nontender, nondistended, normal bowel sounds Extremities: no cyanosis, clubbing or edema noted bilaterally, resting tremors Neuro: no new focal neurological deficits   The results of significant diagnostics from this hospitalization (including imaging, microbiology, ancillary and laboratory) are listed below for reference.    LAB RESULTS: Basic Metabolic Panel:  Recent Labs Lab 07/25/17 1015 07/29/17 0456  NA 141 140  K 4.4 4.2  CL 108 107  CO2 27 29  GLUCOSE 121* 110*  BUN 30* 25*  CREATININE 1.53* 1.48*  CALCIUM 8.8* 8.0*   Liver Function Tests: No results for input(s): AST, ALT, ALKPHOS, BILITOT, PROT, ALBUMIN in the last 168 hours. No results for input(s): LIPASE, AMYLASE in the last 168 hours. No results for input(s): AMMONIA in the last 168 hours. CBC:  Recent Labs Lab 07/28/17 0458 07/29/17 0456  WBC 4.4 5.5  HGB 10.4* 10.1*  HCT 33.2* 31.9*  MCV 94.9 94.7  PLT 124* 131*   Cardiac Enzymes: No results for input(s): CKTOTAL, CKMB, CKMBINDEX, TROPONINI in the last 168 hours. BNP: Invalid input(s): POCBNP CBG: No results for input(s): GLUCAP in the last 168 hours.  Significant Diagnostic Studies:  Dg Chest 2 View  Result Date: 07/25/2017 CLINICAL DATA:  Weakness.  Ex-smoker. EXAM: CHEST  2 VIEW COMPARISON:  05/09/2017. FINDINGS: Interval significantly displaced fractures of the right third, fourth, fifth, sixth and  seventh ribs posteriorly. Moderate-sized right pleural effusion and minimal left pleural effusion. Mild right basilar atelectasis and minimal left basilar atelectasis. Diffuse peribronchial thickening without significant change. No pneumothorax. Thoracic spine degenerative changes. IMPRESSION: 1. Interval displaced right third through seventh rib fractures. 2. Moderate-sized right pleural effusion and minimal left pleural effusion. 3. Mild right basilar atelectasis and minimal left basilar atelectasis. 4. Stable chronic bronchitic changes. Electronically Signed   By: Beckie Salts M.D.   On: 07/25/2017 10:12   Ct Head Wo Contrast  Result Date: 07/25/2017 CLINICAL DATA:  Difficulty with ambulation.  Tremors. EXAM: CT HEAD WITHOUT CONTRAST TECHNIQUE: Contiguous axial images were obtained from the base of the skull through the vertex without intravenous contrast. COMPARISON:  May 20, 2016 FINDINGS: Brain: Mild diffuse atrophy is stable. There is no intracranial mass, hemorrhage, extra-axial fluid collection, or midline shift. There is patchy small vessel disease in the centra semiovale bilaterally. Elsewhere gray-white compartments are normal. No evident acute infarct. Vascular: No hyperdense vessel evident. There is calcification in each cavernous carotid artery region. Skull: Bony calvarium appears intact. Sinuses/Orbits: There is mucosal thickening in several ethmoid air cells. Other visualized paranasal sinuses are clear. Orbits appear symmetric bilaterally. Patient has had cataract removal bilaterally. Other: Postoperative change noted in the left mastoid region. Aerated mastoid air cells bilaterally are clear. There is debris in each external auditory canal. IMPRESSION: 1. Mild atrophy with patchy periventricular small vessel disease, stable. No intracranial mass, hemorrhage, or extra-axial fluid collection. No evident acute infarct. 2.  Mild arterial vascular calcification noted. 3. Postoperative change left  mastoid region. Aerated mastoid air cells are clear. 4.  Mucosal thickening in several ethmoid air cells. 5.  Probable cerumen in each external auditory canal. Electronically Signed   By: Bretta Bang III M.D.   On: 07/25/2017 10:09   Ct Chest Wo Contrast  Result Date: 07/25/2017 CLINICAL DATA:  Multiple right rib fractures falling a fall with a moderate-sized right pleural effusion. The patient is anticoagulated. Clinical concern for pneumothorax. EXAM: CT CHEST WITHOUT CONTRAST TECHNIQUE: Multidetector CT  imaging of the chest was performed following the standard protocol without IV contrast. COMPARISON:  Chest radiographs obtained earlier today. FINDINGS: Cardiovascular: Atheromatous calcifications, including the coronary arteries and aorta. Normal sized heart. Minimal pericardial effusion with a maximum thickness of 4 mm. Mediastinum/Nodes: The right lobe of the thyroid gland is enlarged and extending posteriorly to the level of the spine. There is also a 9.5 mm nodule in the right lobe of the thyroid gland as well is a small or nodule. No enlarged lymph nodes. Lungs/Pleura: Moderately large right pleural effusion measuring 10 Hounsfield units in density. Minimal left pleural fluid. Compressive atelectasis of the right lower lobe. There is also dependent atelectasis in the left lower lobe and inferior aspect of the lingula. There is also mild dependent atelectasis in the right upper lobe. Small amount of patchy opacity in the medial aspect of the left upper lobe, anteriorly. No pneumothorax. Upper Abdomen: Abdominal aortic calcifications. Musculoskeletal: Healing fracture of the distal right scapula, best demonstrated on the coronal images. Displaced right posterior third, fourth, fifth, sixth, seventh, eighth, ninth, tenth, eleventh rib fractures with partial bridging callus formation. There are also right third, fourth, fifth, sixth, seventh posterolateral rib fractures with partially bridging callus.  There are additional old, healed bilateral rib fractures. No vertebral fractures or subluxations are seen. Thoracic spine degenerative changes are noted. IMPRESSION: 1. Subacute right third through eleventh rib fractures with partial bridging callus formation. 2. Additional old, healed bilateral rib fractures. 3. No acute fractures seen today. 4. Subacute, healing distal right scapula fracture. 5. Moderately large, simple right pleural effusion. This does not have features of an acute hemothorax. 6. Minimal left pleural effusion. 7. Bilateral atelectasis. 8. Small amount of patchy atelectasis or pneumonia in the anteromedial aspect of the left upper lobe. 9. 9.5 mm and smaller right lobe thyroid nodules. These are too small to characterize, but most likely benign in the absence of known clinical risk factors for thyroid carcinoma. 10.  Calcific coronary artery and aortic atherosclerosis. 11. Minimal pericardial effusion. Aortic Atherosclerosis (ICD10-I70.0). Electronically Signed   By: Beckie Salts M.D.   On: 07/25/2017 17:32   Mr Brain Wo Contrast  Result Date: 07/25/2017 CLINICAL DATA:  Ataxia.  Difficulty walking. EXAM: MRI HEAD WITHOUT CONTRAST TECHNIQUE: Multiplanar, multiecho pulse sequences of the brain and surrounding structures were obtained without intravenous contrast. COMPARISON:  Head CT 07/25/2017 FINDINGS: The examination had to be discontinued prior to completion due to claustrophobia despite receiving medication. Axial coronal diffusion, axial T2, and axial FLAIR sequences were obtained and are mildly motion degraded. Brain: There is a 2 mm focus of hyperintense signal in the superior right cerebellum on the axial diffusion sequence with corresponding reduced ADC, not confirmed on the coronal diffusion sequences though evaluation is limited by its small size and slice selection. There is no evidence of acute infarct elsewhere. No intracranial hemorrhage, mass, midline shift, or extra-axial fluid  collection is identified. There is mild generalized cerebral atrophy for age. Periventricular white matter T2 hyperintensity is nonspecific but compatible with minimal chronic small vessel ischemic disease, not greater than expected for age. Vascular: Major intracranial vascular flow voids are preserved. Skull and upper cervical spine: No gross osseous lesion. Sinuses/Orbits: Bilateral cataract extraction. Paranasal sinuses and mastoid air cells are clear. Trace fluid in the new nasopharynx. Other: None. IMPRESSION: 1. Motion degraded, incomplete examination. 2. Suspected punctate acute infarct in the right cerebellum. Electronically Signed   By: Sebastian Ache M.D.   On: 07/25/2017 15:22  2D ECHO: Study Conclusions  - Left ventricle: The cavity size was normal. Wall thickness was   increased in a pattern of moderate LVH. Systolic function was   normal. The estimated ejection fraction was in the range of 55%   to 60%. Wall motion was normal; there were no regional wall   motion abnormalities. - Aortic valve: Mildly calcified annulus. Trileaflet; normal   thickness, mildly calcified leaflets. - Mitral valve: Mildly to moderately calcified annulus. - Pulmonary arteries: Systolic pressure was mildly increased. PA   peak pressure: 43 mm Hg (S).  Impressions:  - No cardiac source of emboli was indentified.  Disposition and Follow-up: Discharge Instructions    Diet - low sodium heart healthy    Complete by:  As directed    Increase activity slowly    Complete by:  As directed        DISPOSITION:  SNF   DISCHARGE FOLLOW-UP Follow-up Information    Renee Canavan, PA-C. Schedule an appointment as soon as possible for a visit in 2 week(s).   Specialty:  Family Medicine Contact information: [address] [place] [address] [phone]            Time spent on Discharge:   Signed:   Thad Ranger M.D. [facility] 07/29/2017, 11:35 AM Pager:  [phone]

## 2017-07-29 NOTE — Clinical Social Work Placement (Addendum)
Nurse to call report to [phone], Room 1005B    CLINICAL SOCIAL WORK PLACEMENT  NOTE  Date:  07/29/2017  Patient Details  Name: [name] MRN: [id number] Date of Birth: [birth year]  Clinical Social Work is seeking post-discharge placement for this patient at the Skilled  Nursing Facility level of care (*CSW will initial, date and re-position this form in  chart as items are completed):  Yes   Patient/family provided with [facility] Clinical Social Work Department's list of facilities offering this level of care within the geographic area requested by the patient (or if unable, by the patient's family).  Yes   Patient/family informed of their freedom to choose among providers that offer the needed level of care, that participate in Medicare, Medicaid or managed care program needed by the patient, have an available bed and are willing to accept the patient.  Yes   Patient/family informed of [facility]'s ownership interest in [facility] and [facility], as well as of the fact that they are under no obligation to receive care at these facilities.  PASRR submitted to EDS on 07/27/17     PASRR number received on 07/29/17     Existing PASRR number confirmed on       FL2 transmitted to all facilities in geographic area requested by pt/family on       FL2 transmitted to all facilities within larger geographic area on 07/28/17     Patient informed that his/her managed care company has contracts with or will negotiate with certain facilities, including the following:        Yes   Patient/family informed of bed offers received.  Patient chooses bed at [facility]     Physician recommends and patient chooses bed at      Patient to be transferred to [facility] on 07/29/17.  Patient to be transferred to facility by [facility]     Patient family notified on 07/29/17 of transfer.  Name of family member notified:  [place]     PHYSICIAN       Additional Comment:     _______________________________________________ Baldemar Lenis, LCSW 07/29/2017, 1:21 PM

## 2017-07-29 NOTE — Progress Notes (Signed)
Physical Therapy Treatment Patient Details Name: [name] MRN: [id number] DOB: Jul 12, 1935 Today's Date: 07/29/2017    History of Present Illness Pt is an 82 y.o. female with PMH significant for developmental disability/chronic cognitive dysfunction and chronic tremors. She was brought to the ED with difficulty ambulating and leaning toward the R side at her group home. She has had recent falls. Found to have multiple rib fractures, R sided pleural effusion. 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    PT Comments    Pt agreeable to getting up and wanted to be out of bed but very fearful when attempting to stand, was unable to achieve standing with max A +2. With stedy, pt showed more confidence and was able to achieve full standing. Pt performed seated there ex EOB before transferring to chair. PT will continue to follow.    Follow Up Recommendations  SNF     Equipment Recommendations  None recommended by PT    Recommendations for Other Services       Precautions / Restrictions Precautions Precautions: Fall Restrictions Weight Bearing Restrictions: No    Mobility  Bed Mobility Overal bed mobility: Needs Assistance Bed Mobility: Rolling;Sidelying to Sit Rolling: Mod assist Sidelying to sit: Mod assist       General bed mobility comments: guided pt's hand to rail but she was pushing back against rail, did better without holding rail.   Transfers Overall transfer level: Needs assistance Equipment used: Rolling walker (2 wheeled) Transfers: Sit to/from [employer] Sit to Stand: Max assist;+2 physical assistance Stand pivot transfers: +2 safety/equipment;Total assist       General transfer comment: attempted standing with B HHA but pt seemed very fearful and was pulling back. Next stood with stedy, she seemed more secure and participated in effort, though still needed max A +2 to achieve full standing.   Ambulation/Gait              General Gait Details: unable at this point   Stairs            Wheelchair Mobility    Modified Rankin (Stroke Patients Only) Modified Rankin (Stroke Patients Only) Pre-Morbid Rankin Score: Severe disability Modified Rankin: Severe disability     Balance Overall balance assessment: Needs assistance;History of Falls Sitting-balance support: Feet supported Sitting balance-Leahy Scale: Poor Sitting balance - Comments: min A initially and pt with R lean, then able to maintain balance with superivision. Though when performed LE ther ex could not maintain balance without min A due to posterior and R leab Postural control: Posterior lean;Right lateral lean Standing balance support: Bilateral upper extremity supported;During functional activity Standing balance-Leahy Scale: Zero                              Cognition Arousal/Alertness: Awake/alert Behavior During Therapy: WFL for tasks assessed/performed Overall Cognitive Status: History of cognitive impairments - at baseline Area of Impairment: Memory;Following commands;Safety/judgement;Problem solving                     Memory: Decreased short-term memory Following Commands: Follows one step commands with increased time;Follows one step commands inconsistently Safety/Judgement: Decreased awareness of safety;Decreased awareness of deficits   Problem Solving: Slow processing;Decreased initiation;Difficulty sequencing;Requires verbal cues;Requires tactile cues General Comments: pt with hx of dementia at baseline      Exercises General Exercises - Upper Extremity Shoulder Flexion: AAROM;Both;10 reps;Seated General Exercises - Lower Extremity Ankle Circles/Pumps: AROM;Both;10  reps;Seated Long Arc Quad: AROM;Both;10 reps;Seated Hip Flexion/Marching: AROM;Both;10 reps;Seated    General Comments        Pertinent Vitals/Pain Pain Assessment: Faces Faces Pain Scale: Hurts little more Pain Location:  groaned when rolling over, could not give location of soreness, seemed stif. Noted R knee swelling Pain Descriptors / Indicators: Guarding;Sore Pain Intervention(s): Monitored during session    Home Living                      Prior Function            PT Goals (current goals can now be found in the care plan section) Acute Rehab PT Goals Patient Stated Goal: none stated PT Goal Formulation: Patient unable to participate in goal setting Time For Goal Achievement: 08/09/17 Potential to Achieve Goals: Fair Progress towards PT goals: Not progressing toward goals - comment (needed more assist today)    Frequency    Min 2X/week      PT Plan Current plan remains appropriate    Ford-evaluation              AM-PAC PT "6 Clicks" Daily Activity  Outcome Measure  Difficulty turning over in bed (including adjusting bedclothes, sheets and blankets)?: Unable Difficulty moving from lying on back to sitting on the side of the bed? : Unable Difficulty sitting down on and standing up from a chair with arms (e.g., wheelchair, bedside commode, etc,.)?: Unable Help needed moving to and from a bed to chair (including a wheelchair)?: Total Help needed walking in hospital room?: Total Help needed climbing 3-5 steps with a railing? : Total 6 Click Score: 6    End of Session Equipment Utilized During Treatment: Gait belt Activity Tolerance: Patient tolerated treatment well Patient left: in chair;with call bell/phone within reach;with chair alarm set Nurse Communication: Mobility status PT Visit Diagnosis: Other abnormalities of gait and mobility (R26.89);Repeated falls (R29.6)     Time: 4098-1191 PT Time Calculation (min) (ACUTE ONLY): 19 min  Charges:  $Therapeutic Activity: 8-22 mins                    G Codes:       [name], PT  Acute Rehab Services  [phone]    [name] 07/29/2017, 10:41 AM

## 2017-07-30 ENCOUNTER — Emergency Department (HOSPITAL_COMMUNITY): Payer: Medicare Other

## 2017-07-30 ENCOUNTER — Inpatient Hospital Stay (HOSPITAL_COMMUNITY)
Admission: EM | Admit: 2017-07-30 | Discharge: 2017-08-04 | DRG: 871 | Disposition: A | Discharge status: Skilled Nursing Facility | Payer: Medicare Other | Attending: Family Medicine | Admitting: Family Medicine

## 2017-07-30 ENCOUNTER — Encounter (HOSPITAL_COMMUNITY): Payer: Self-pay

## 2017-07-30 DIAGNOSIS — R251 Tremor, unspecified: Secondary | ICD-10-CM | POA: Diagnosis present

## 2017-07-30 DIAGNOSIS — Z87891 Personal history of nicotine dependence: Secondary | ICD-10-CM | POA: Diagnosis not present

## 2017-07-30 DIAGNOSIS — Y95 Nosocomial condition: Secondary | ICD-10-CM | POA: Diagnosis present

## 2017-07-30 DIAGNOSIS — E1122 Type 2 diabetes mellitus with diabetic chronic kidney disease: Secondary | ICD-10-CM | POA: Diagnosis present

## 2017-07-30 DIAGNOSIS — Z7901 Long term (current) use of anticoagulants: Secondary | ICD-10-CM | POA: Diagnosis not present

## 2017-07-30 DIAGNOSIS — I10 Essential (primary) hypertension: Secondary | ICD-10-CM

## 2017-07-30 DIAGNOSIS — S2241XD Multiple fractures of ribs, right side, subsequent encounter for fracture with routine healing: Secondary | ICD-10-CM | POA: Diagnosis not present

## 2017-07-30 DIAGNOSIS — R296 Repeated falls: Secondary | ICD-10-CM

## 2017-07-30 DIAGNOSIS — Z66 Do not resuscitate: Secondary | ICD-10-CM | POA: Diagnosis present

## 2017-07-30 DIAGNOSIS — A419 Sepsis, unspecified organism: Secondary | ICD-10-CM | POA: Diagnosis not present

## 2017-07-30 DIAGNOSIS — J4540 Moderate persistent asthma, uncomplicated: Secondary | ICD-10-CM | POA: Diagnosis present

## 2017-07-30 DIAGNOSIS — N183 Chronic kidney disease, stage 3 unspecified: Secondary | ICD-10-CM | POA: Diagnosis present

## 2017-07-30 DIAGNOSIS — L89311 Pressure ulcer of right buttock, stage 1: Secondary | ICD-10-CM | POA: Diagnosis not present

## 2017-07-30 DIAGNOSIS — I421 Obstructive hypertrophic cardiomyopathy: Secondary | ICD-10-CM | POA: Diagnosis present

## 2017-07-30 DIAGNOSIS — S2241XA Multiple fractures of ribs, right side, initial encounter for closed fracture: Secondary | ICD-10-CM | POA: Diagnosis present

## 2017-07-30 DIAGNOSIS — N189 Chronic kidney disease, unspecified: Secondary | ICD-10-CM | POA: Diagnosis present

## 2017-07-30 DIAGNOSIS — I129 Hypertensive chronic kidney disease with stage 1 through stage 4 chronic kidney disease, or unspecified chronic kidney disease: Secondary | ICD-10-CM | POA: Diagnosis present

## 2017-07-30 DIAGNOSIS — Z8673 Personal history of transient ischemic attack (TIA), and cerebral infarction without residual deficits: Secondary | ICD-10-CM | POA: Diagnosis not present

## 2017-07-30 DIAGNOSIS — F70 Mild intellectual disabilities: Secondary | ICD-10-CM | POA: Diagnosis not present

## 2017-07-30 DIAGNOSIS — J189 Pneumonia, unspecified organism: Secondary | ICD-10-CM | POA: Diagnosis not present

## 2017-07-30 DIAGNOSIS — J440 Chronic obstructive pulmonary disease with acute lower respiratory infection: Secondary | ICD-10-CM | POA: Diagnosis present

## 2017-07-30 DIAGNOSIS — Z515 Encounter for palliative care: Secondary | ICD-10-CM | POA: Diagnosis not present

## 2017-07-30 DIAGNOSIS — R278 Other lack of coordination: Secondary | ICD-10-CM | POA: Diagnosis not present

## 2017-07-30 DIAGNOSIS — J918 Pleural effusion in other conditions classified elsewhere: Secondary | ICD-10-CM | POA: Diagnosis not present

## 2017-07-30 DIAGNOSIS — R498 Other voice and resonance disorders: Secondary | ICD-10-CM | POA: Diagnosis not present

## 2017-07-30 DIAGNOSIS — Z86718 Personal history of other venous thrombosis and embolism: Secondary | ICD-10-CM | POA: Diagnosis not present

## 2017-07-30 DIAGNOSIS — K219 Gastro-esophageal reflux disease without esophagitis: Secondary | ICD-10-CM | POA: Diagnosis present

## 2017-07-30 DIAGNOSIS — R2689 Other abnormalities of gait and mobility: Secondary | ICD-10-CM | POA: Diagnosis not present

## 2017-07-30 DIAGNOSIS — S2241XS Multiple fractures of ribs, right side, sequela: Secondary | ICD-10-CM | POA: Diagnosis not present

## 2017-07-30 DIAGNOSIS — J80 Acute respiratory distress syndrome: Secondary | ICD-10-CM | POA: Diagnosis not present

## 2017-07-30 DIAGNOSIS — E785 Hyperlipidemia, unspecified: Secondary | ICD-10-CM | POA: Diagnosis present

## 2017-07-30 DIAGNOSIS — J90 Pleural effusion, not elsewhere classified: Secondary | ICD-10-CM | POA: Diagnosis not present

## 2017-07-30 DIAGNOSIS — R509 Fever, unspecified: Secondary | ICD-10-CM | POA: Diagnosis not present

## 2017-07-30 DIAGNOSIS — J9601 Acute respiratory failure with hypoxia: Secondary | ICD-10-CM | POA: Diagnosis present

## 2017-07-30 DIAGNOSIS — G894 Chronic pain syndrome: Secondary | ICD-10-CM | POA: Diagnosis not present

## 2017-07-30 DIAGNOSIS — R41841 Cognitive communication deficit: Secondary | ICD-10-CM | POA: Diagnosis not present

## 2017-07-30 DIAGNOSIS — Z90710 Acquired absence of both cervix and uterus: Secondary | ICD-10-CM | POA: Diagnosis not present

## 2017-07-30 DIAGNOSIS — E119 Type 2 diabetes mellitus without complications: Secondary | ICD-10-CM

## 2017-07-30 DIAGNOSIS — R293 Abnormal posture: Secondary | ICD-10-CM | POA: Diagnosis not present

## 2017-07-30 DIAGNOSIS — G464 Cerebellar stroke syndrome: Secondary | ICD-10-CM | POA: Diagnosis not present

## 2017-07-30 DIAGNOSIS — M6281 Muscle weakness (generalized): Secondary | ICD-10-CM | POA: Diagnosis not present

## 2017-07-30 DIAGNOSIS — R1312 Dysphagia, oropharyngeal phase: Secondary | ICD-10-CM | POA: Diagnosis not present

## 2017-07-30 LAB — COMPREHENSIVE METABOLIC PANEL
ALBUMIN: 3.1 g/dL — AB (ref 3.5–5.0)
ALT: 22 U/L (ref 14–54)
AST: 42 U/L — ABNORMAL HIGH (ref 15–41)
Alkaline Phosphatase: 95 U/L (ref 38–126)
Anion gap: 11 (ref 5–15)
BILIRUBIN TOTAL: 0.6 mg/dL (ref 0.3–1.2)
BUN: 31 mg/dL — AB (ref 6–20)
CHLORIDE: 105 mmol/L (ref 101–111)
CO2: 24 mmol/L (ref 22–32)
Calcium: 8.2 mg/dL — ABNORMAL LOW (ref 8.9–10.3)
Creatinine, Ser: 1.66 mg/dL — ABNORMAL HIGH (ref 0.44–1.00)
GFR calc Af Amer: 33 mL/min — ABNORMAL LOW (ref >60)
GFR calc non Af Amer: 28 mL/min — ABNORMAL LOW (ref >60)
Glucose, Bld: 193 mg/dL — ABNORMAL HIGH (ref 65–99)
POTASSIUM: 4.9 mmol/L (ref 3.5–5.1)
SODIUM: 140 mmol/L (ref 135–145)
Total Protein: 7.1 g/dL (ref 6.5–8.1)

## 2017-07-30 LAB — URINALYSIS, ROUTINE W REFLEX MICROSCOPIC
Bilirubin Urine: NEGATIVE
GLUCOSE, UA: NEGATIVE mg/dL
Hgb urine dipstick: NEGATIVE
Ketones, ur: NEGATIVE mg/dL
Nitrite: NEGATIVE
PH: 5 (ref 5.0–8.0)
PROTEIN: 30 mg/dL — AB
SQUAMOUS EPITHELIAL / LPF: NONE SEEN
Specific Gravity, Urine: 1.015 (ref 1.005–1.030)

## 2017-07-30 LAB — CBC WITH DIFFERENTIAL/PLATELET
Basophils Absolute: 0 10*3/uL (ref 0.0–0.1)
Basophils Relative: 0 %
EOS PCT: 0 %
Eosinophils Absolute: 0 10*3/uL (ref 0.0–0.7)
HEMATOCRIT: 41.7 % (ref 36.0–46.0)
Hemoglobin: 13.7 g/dL (ref 12.0–15.0)
LYMPHS ABS: 0.6 10*3/uL — AB (ref 0.7–4.0)
LYMPHS PCT: 3 %
MCH: 31.1 pg (ref 26.0–34.0)
MCHC: 32.9 g/dL (ref 30.0–36.0)
MCV: 94.8 fL (ref 78.0–100.0)
MONO ABS: 2.2 10*3/uL — AB (ref 0.1–1.0)
MONOS PCT: 9 %
Neutro Abs: 21.5 10*3/uL — ABNORMAL HIGH (ref 1.7–7.7)
Neutrophils Relative %: 88 %
PLATELETS: 234 10*3/uL (ref 150–400)
RBC: 4.4 MIL/uL (ref 3.87–5.11)
RDW: 12.4 % (ref 11.5–15.5)
WBC: 24.4 10*3/uL — ABNORMAL HIGH (ref 4.0–10.5)

## 2017-07-30 LAB — I-STAT ARTERIAL BLOOD GAS, ED
Bicarbonate: 25 mmol/L (ref 20.0–28.0)
O2 SAT: 99 %
PCO2 ART: 42.8 mmHg (ref 32.0–48.0)
PO2 ART: 146 mmHg — AB (ref 83.0–108.0)
Patient temperature: 98.6
TCO2: 26 mmol/L (ref 22–32)
pH, Arterial: 7.376 (ref 7.350–7.450)

## 2017-07-30 LAB — PROTIME-INR
INR: 1.94
PROTHROMBIN TIME: 22 s — AB (ref 11.4–15.2)

## 2017-07-30 LAB — HEMOGLOBIN A1C
HEMOGLOBIN A1C: 6 % — AB (ref 4.8–5.6)
MEAN PLASMA GLUCOSE: 125.5 mg/dL

## 2017-07-30 LAB — I-STAT CG4 LACTIC ACID, ED: LACTIC ACID, VENOUS: 2.07 mmol/L — AB (ref 0.5–1.9)

## 2017-07-30 LAB — LACTIC ACID, PLASMA: Lactic Acid, Venous: 1.9 mmol/L (ref 0.5–1.9)

## 2017-07-30 MED ORDER — ACETAMINOPHEN 650 MG RE SUPP
650.0000 mg | Freq: Once | RECTAL | Status: AC
  Administered 2017-07-30: 650 mg via RECTAL
  Filled 2017-07-30: qty 1

## 2017-07-30 MED ORDER — LEVALBUTEROL HCL 0.63 MG/3ML IN NEBU: 0.6300 mg | INHALATION_SOLUTION | RESPIRATORY_TRACT | Status: DC | PRN

## 2017-07-30 MED ORDER — ONDANSETRON HCL 4 MG/2ML IJ SOLN
4.0000 mg | Freq: Four times a day (QID) | INTRAMUSCULAR | Status: DC | PRN
  Administered 2017-07-31 – 2017-08-04 (×9): 4 mg via INTRAVENOUS
  Filled 2017-07-30 (×10): qty 2

## 2017-07-30 MED ORDER — BUDESONIDE 0.5 MG/2ML IN SUSP
0.5000 mg | Freq: Two times a day (BID) | RESPIRATORY_TRACT | Status: DC
  Administered 2017-07-31 – 2017-08-04 (×9): 0.5 mg via RESPIRATORY_TRACT
  Filled 2017-07-30 (×11): qty 2

## 2017-07-30 MED ORDER — LAMOTRIGINE 25 MG PO TABS
25.0000 mg | ORAL_TABLET | Freq: Every day | ORAL | Status: DC
  Administered 2017-07-31 – 2017-08-03 (×4): 25 mg via ORAL
  Filled 2017-07-30 (×5): qty 1

## 2017-07-30 MED ORDER — ATORVASTATIN CALCIUM 40 MG PO TABS: 40.0000 mg | ORAL_TABLET | Freq: Every day | ORAL | Status: DC

## 2017-07-30 MED ORDER — METOCLOPRAMIDE HCL 5 MG PO TABS
2.5000 mg | ORAL_TABLET | Freq: Every day | ORAL | Status: DC
  Filled 2017-07-30: qty 0.5

## 2017-07-30 MED ORDER — POLYETHYLENE GLYCOL 3350 17 G PO PACK: 17.0000 g | PACK | Freq: Every day | ORAL | Status: DC | PRN

## 2017-07-30 MED ORDER — INSULIN ASPART 100 UNIT/ML ~~LOC~~ SOLN
0.0000 [IU] | SUBCUTANEOUS | Status: DC
  Administered 2017-07-31: 1 [IU] via SUBCUTANEOUS
  Administered 2017-07-31: 3 [IU] via SUBCUTANEOUS
  Administered 2017-08-01 – 2017-08-02 (×4): 2 [IU] via SUBCUTANEOUS
  Administered 2017-08-03 (×2): 1 [IU] via SUBCUTANEOUS
  Administered 2017-08-03: 3 [IU] via SUBCUTANEOUS
  Administered 2017-08-03 – 2017-08-04 (×3): 1 [IU] via SUBCUTANEOUS
  Filled 2017-07-30 (×2): qty 1

## 2017-07-30 MED ORDER — SODIUM CHLORIDE 0.9 % IV BOLUS (SEPSIS)
500.0000 mL | Freq: Once | INTRAVENOUS | Status: AC
  Administered 2017-07-30: 500 mL via INTRAVENOUS

## 2017-07-30 MED ORDER — BISACODYL 10 MG RE SUPP: 10.0000 mg | Freq: Every day | RECTAL | Status: DC | PRN

## 2017-07-30 MED ORDER — SODIUM CHLORIDE 0.9 % IV SOLN
INTRAVENOUS | Status: AC
  Administered 2017-07-30: via INTRAVENOUS

## 2017-07-30 MED ORDER — VANCOMYCIN HCL IN DEXTROSE 1-5 GM/200ML-% IV SOLN: 1000.0000 mg | Freq: Once | INTRAVENOUS | Status: DC

## 2017-07-30 MED ORDER — BUPROPION HCL ER (SR) 150 MG PO TB12
150.0000 mg | ORAL_TABLET | Freq: Two times a day (BID) | ORAL | Status: DC
  Filled 2017-07-30: qty 1

## 2017-07-30 MED ORDER — CEFEPIME HCL 1 G IJ SOLR
1.0000 g | INTRAMUSCULAR | Status: DC
  Administered 2017-07-31 – 2017-08-01 (×2): 1 g via INTRAVENOUS
  Filled 2017-07-30 (×3): qty 1

## 2017-07-30 MED ORDER — FLUOXETINE HCL 20 MG PO CAPS
40.0000 mg | ORAL_CAPSULE | Freq: Every day | ORAL | Status: DC
  Filled 2017-07-30: qty 2

## 2017-07-30 MED ORDER — ONDANSETRON HCL 4 MG PO TABS
4.0000 mg | ORAL_TABLET | Freq: Four times a day (QID) | ORAL | Status: DC | PRN
  Administered 2017-08-03: 4 mg via ORAL
  Filled 2017-07-30: qty 1

## 2017-07-30 MED ORDER — ACETAMINOPHEN 325 MG PO TABS
650.0000 mg | ORAL_TABLET | Freq: Once | ORAL | Status: DC
Start: 2017-07-30 — End: 2017-07-30

## 2017-07-30 MED ORDER — IPRATROPIUM-ALBUTEROL 0.5-2.5 (3) MG/3ML IN SOLN
3.0000 mL | Freq: Once | RESPIRATORY_TRACT | Status: AC
  Administered 2017-07-30: 3 mL via RESPIRATORY_TRACT
  Filled 2017-07-30: qty 3

## 2017-07-30 MED ORDER — METOPROLOL TARTRATE 25 MG PO TABS: 12.5000 mg | ORAL_TABLET | Freq: Two times a day (BID) | ORAL | Status: DC

## 2017-07-30 MED ORDER — HYDROCODONE-ACETAMINOPHEN 5-325 MG PO TABS: 1.0000 | ORAL_TABLET | ORAL | Status: DC | PRN

## 2017-07-30 MED ORDER — LORAZEPAM 0.5 MG PO TABS
0.5000 mg | ORAL_TABLET | Freq: Three times a day (TID) | ORAL | Status: DC
  Administered 2017-07-31: 0.5 mg via ORAL
  Filled 2017-07-30: qty 1

## 2017-07-30 MED ORDER — SODIUM CHLORIDE 0.9% FLUSH
3.0000 mL | Freq: Two times a day (BID) | INTRAVENOUS | Status: DC
  Administered 2017-07-30 – 2017-08-04 (×8): 3 mL via INTRAVENOUS

## 2017-07-30 MED ORDER — VANCOMYCIN HCL 10 G IV SOLR
1250.0000 mg | Freq: Once | INTRAVENOUS | Status: AC
  Administered 2017-07-30: 1250 mg via INTRAVENOUS
  Filled 2017-07-30: qty 1250

## 2017-07-30 MED ORDER — CEFEPIME HCL 2 G IJ SOLR
2.0000 g | Freq: Once | INTRAMUSCULAR | Status: AC
  Administered 2017-07-30: 2 g via INTRAVENOUS
  Filled 2017-07-30: qty 2

## 2017-07-30 MED ORDER — DIVALPROEX SODIUM ER 500 MG PO TB24
500.0000 mg | ORAL_TABLET | Freq: Every day | ORAL | Status: DC
  Filled 2017-07-30: qty 1

## 2017-07-30 MED ORDER — ACETAMINOPHEN 650 MG RE SUPP: 650.0000 mg | Freq: Four times a day (QID) | RECTAL | Status: DC | PRN

## 2017-07-30 MED ORDER — VANCOMYCIN HCL IN DEXTROSE 750-5 MG/150ML-% IV SOLN
750.0000 mg | INTRAVENOUS | Status: DC
  Administered 2017-07-31 – 2017-08-01 (×2): 750 mg via INTRAVENOUS
  Filled 2017-07-30 (×2): qty 150

## 2017-07-30 MED ORDER — ACETAMINOPHEN 325 MG PO TABS
650.0000 mg | ORAL_TABLET | Freq: Four times a day (QID) | ORAL | Status: DC | PRN
  Administered 2017-08-03: 650 mg via ORAL
  Filled 2017-07-30: qty 2

## 2017-07-30 NOTE — ED Notes (Addendum)
ED Provider at bedside. Aware of pt temperature and lactic acid.

## 2017-07-30 NOTE — ED Notes (Addendum)
RT at bedside with bipap. ABG attempt unsuccessful, Tim RT to attempt ABG

## 2017-07-30 NOTE — Progress Notes (Signed)
Arterial blood gas drawn while patient was on Bipap 14/6 with oxygen set at 50%.
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
## 2017-07-30 NOTE — ED Notes (Signed)
ED Provider at bedside. 

## 2017-07-30 NOTE — Progress Notes (Signed)
Pharmacy Antibiotic Note  [name] is a 82 y.o. female admitted on 07/30/2017 with pneumonia/sepsis.  Pharmacy has been consulted for vancomycin/cefepime dosing. Tmax/24h 101.8, WBC 24.4, LA 2.07. SCr 1.66 on admit, CrCl~26.  Plan: Cefepime 2g IV x 1; then 1g IV q24h Vancomycin 1250mg  IV x1; then 750mg  IV q24h Monitor clinical progress, c/s, renal function F/u de-escalation plan/LOT, vancomycin trough as indicated   Height: 5\' 8"  (172.7 cm) Weight: 134 lb (60.8 kg) IBW/kg (Calculated) : 63.9  Temp (24hrs), Avg:101.8 F (38.8 C), Min:101.8 F (38.8 C), Max:101.8 F (38.8 C)   Recent Labs Lab 07/25/17 1015 07/26/17 0728 07/27/17 0409 07/28/17 0458 07/29/17 0456 07/30/17 1915 07/30/17 1927  WBC 6.2 4.4 4.7 4.4 5.5 24.4*  --   CREATININE 1.53*  --   --   --  1.48*  --   --   LATICACIDVEN  --   --   --   --   --   --  2.07*    Estimated Creatinine Clearance: 29.1 mL/min (A) (by C-G formula based on SCr of 1.48 mg/dL (H)).    Allergies  Allergen Reactions  . Levofloxacin Other (See Comments)    unknown    Antimicrobials this admission: 10/3 vancomycin >>  10/3 cefepime >>   Dose adjustments this admission:   Microbiology results:   Babs Bertin, PharmD, BCPS Clinical Pharmacist 07/30/2017 7:54 PM

## 2017-07-30 NOTE — ED Notes (Signed)
Admitting at the bedside.  

## 2017-07-30 NOTE — ED Notes (Signed)
RT at bedside for ABG

## 2017-07-30 NOTE — ED Notes (Signed)
RT at bedside.
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
## 2017-07-30 NOTE — ED Notes (Signed)
Pt family left for the evening, given phone number of pt sister in law and health care POA in the event she needs to be reached.   [name]: [phone]

## 2017-07-30 NOTE — ED Notes (Signed)
XR at bedside

## 2017-07-30 NOTE — ED Triage Notes (Signed)
Pt from [facility] via EMS. Pt d/c from [facility] 10/2 after mild stroke, staff at [facility] unable to clarify neuro deficits post stroke. Per pt sister in law, pt seemed weaker and fatigued than normal after PT. Pt sister in law received a call from facility at approx at 6 PM notifying family that pt oxygen was low and EMS was called. Pt alert to baseline per staff.  EMS VS: 140/100, HR 138, RR 22, O2 83% on RA, 99% on nonrebreather, CBG 166, temp 99.9.

## 2017-07-31 ENCOUNTER — Inpatient Hospital Stay (HOSPITAL_COMMUNITY): Payer: Medicare Other

## 2017-07-31 DIAGNOSIS — L89311 Pressure ulcer of right buttock, stage 1: Secondary | ICD-10-CM

## 2017-07-31 LAB — COMPREHENSIVE METABOLIC PANEL
ALK PHOS: 70 U/L (ref 38–126)
ALT: 18 U/L (ref 14–54)
ANION GAP: 7 (ref 5–15)
AST: 30 U/L (ref 15–41)
Albumin: 2.4 g/dL — ABNORMAL LOW (ref 3.5–5.0)
BILIRUBIN TOTAL: 0.3 mg/dL (ref 0.3–1.2)
BUN: 35 mg/dL — ABNORMAL HIGH (ref 6–20)
CALCIUM: 7.4 mg/dL — AB (ref 8.9–10.3)
CO2: 25 mmol/L (ref 22–32)
Chloride: 108 mmol/L (ref 101–111)
Creatinine, Ser: 1.48 mg/dL — ABNORMAL HIGH (ref 0.44–1.00)
GFR calc Af Amer: 37 mL/min — ABNORMAL LOW (ref >60)
GFR, EST NON AFRICAN AMERICAN: 32 mL/min — AB (ref >60)
Glucose, Bld: 98 mg/dL (ref 65–99)
POTASSIUM: 4.2 mmol/L (ref 3.5–5.1)
Sodium: 140 mmol/L (ref 135–145)
TOTAL PROTEIN: 5.3 g/dL — AB (ref 6.5–8.1)

## 2017-07-31 LAB — CBC
HCT: 32.8 % — ABNORMAL LOW (ref 36.0–46.0)
HEMOGLOBIN: 10.4 g/dL — AB (ref 12.0–15.0)
MCH: 29.8 pg (ref 26.0–34.0)
MCHC: 31.7 g/dL (ref 30.0–36.0)
MCV: 94 fL (ref 78.0–100.0)
Platelets: 141 10*3/uL — ABNORMAL LOW (ref 150–400)
RBC: 3.49 MIL/uL — AB (ref 3.87–5.11)
RDW: 12.4 % (ref 11.5–15.5)
WBC: 11.4 10*3/uL — ABNORMAL HIGH (ref 4.0–10.5)

## 2017-07-31 LAB — PROTIME-INR
INR: 2.94
PROTHROMBIN TIME: 30.4 s — AB (ref 11.4–15.2)

## 2017-07-31 LAB — CBG MONITORING, ED
GLUCOSE-CAPILLARY: 123 mg/dL — AB (ref 65–99)
GLUCOSE-CAPILLARY: 126 mg/dL — AB (ref 65–99)
GLUCOSE-CAPILLARY: 206 mg/dL — AB (ref 65–99)
Glucose-Capillary: 118 mg/dL — ABNORMAL HIGH (ref 65–99)
Glucose-Capillary: 110 mg/dL — ABNORMAL HIGH (ref 65–99)

## 2017-07-31 LAB — INFLUENZA PANEL BY PCR (TYPE A & B)
INFLAPCR: NEGATIVE
INFLBPCR: NEGATIVE

## 2017-07-31 LAB — GLUCOSE, CAPILLARY
GLUCOSE-CAPILLARY: 99 mg/dL (ref 65–99)
Glucose-Capillary: 119 mg/dL — ABNORMAL HIGH (ref 65–99)

## 2017-07-31 LAB — STREP PNEUMONIAE URINARY ANTIGEN: STREP PNEUMO URINARY ANTIGEN: NEGATIVE

## 2017-07-31 LAB — TSH: TSH: 1.35 u[IU]/mL (ref 0.350–4.500)

## 2017-07-31 LAB — LACTIC ACID, PLASMA: Lactic Acid, Venous: 0.9 mmol/L (ref 0.5–1.9)

## 2017-07-31 LAB — PROCALCITONIN: Procalcitonin: 5.58 ng/mL

## 2017-07-31 LAB — PHOSPHORUS: PHOSPHORUS: 2.6 mg/dL (ref 2.5–4.6)

## 2017-07-31 LAB — MAGNESIUM: MAGNESIUM: 2 mg/dL (ref 1.7–2.4)

## 2017-07-31 LAB — APTT: aPTT: 49 seconds — ABNORMAL HIGH (ref 24–36)

## 2017-07-31 MED ORDER — FLUOXETINE HCL 20 MG PO CAPS
40.0000 mg | ORAL_CAPSULE | Freq: Every day | ORAL | Status: DC
  Administered 2017-07-31 – 2017-08-03 (×4): 40 mg via ORAL
  Filled 2017-07-31 (×5): qty 2

## 2017-07-31 MED ORDER — LORAZEPAM 2 MG/ML IJ SOLN: 0.5000 mg | Freq: Four times a day (QID) | INTRAMUSCULAR | Status: DC | PRN

## 2017-07-31 MED ORDER — DIVALPROEX SODIUM ER 500 MG PO TB24
500.0000 mg | ORAL_TABLET | Freq: Every day | ORAL | Status: AC
  Administered 2017-07-31 – 2017-08-01 (×2): 500 mg via ORAL
  Filled 2017-07-31 (×3): qty 1

## 2017-07-31 MED ORDER — PANTOPRAZOLE SODIUM 40 MG PO TBEC: 40.0000 mg | DELAYED_RELEASE_TABLET | Freq: Every day | ORAL | Status: DC

## 2017-07-31 MED ORDER — MORPHINE SULFATE (PF) 4 MG/ML IV SOLN
INTRAVENOUS | Status: AC
  Filled 2017-07-31: qty 1

## 2017-07-31 MED ORDER — METOPROLOL TARTRATE 5 MG/5ML IV SOLN
2.5000 mg | Freq: Two times a day (BID) | INTRAVENOUS | Status: DC
  Administered 2017-07-31: 2.5 mg via INTRAVENOUS
  Filled 2017-07-31: qty 5

## 2017-07-31 MED ORDER — MORPHINE SULFATE (PF) 4 MG/ML IV SOLN: 2.0000 mg | INTRAVENOUS | Status: DC | PRN

## 2017-07-31 MED ORDER — VALPROATE SODIUM 500 MG/5ML IV SOLN
500.0000 mg | Freq: Two times a day (BID) | INTRAVENOUS | Status: DC
  Administered 2017-07-31: 500 mg via INTRAVENOUS
  Filled 2017-07-31: qty 5

## 2017-07-31 MED ORDER — ATORVASTATIN CALCIUM 40 MG PO TABS
40.0000 mg | ORAL_TABLET | Freq: Every day | ORAL | Status: DC
  Administered 2017-07-31 – 2017-08-03 (×4): 40 mg via ORAL
  Filled 2017-07-31 (×4): qty 1

## 2017-07-31 MED ORDER — METOPROLOL TARTRATE 12.5 MG HALF TABLET
12.5000 mg | ORAL_TABLET | Freq: Two times a day (BID) | ORAL | Status: DC
  Administered 2017-07-31 – 2017-08-03 (×7): 12.5 mg via ORAL
  Filled 2017-07-31 (×8): qty 1

## 2017-07-31 MED ORDER — BUPROPION HCL ER (SR) 150 MG PO TB12
150.0000 mg | ORAL_TABLET | Freq: Two times a day (BID) | ORAL | Status: DC
  Administered 2017-07-31 – 2017-08-03 (×7): 150 mg via ORAL
  Filled 2017-07-31 (×8): qty 1

## 2017-07-31 MED ORDER — LORAZEPAM 0.5 MG PO TABS
0.5000 mg | ORAL_TABLET | Freq: Three times a day (TID) | ORAL | Status: DC | PRN
  Administered 2017-07-31 – 2017-08-03 (×6): 0.5 mg via ORAL
  Filled 2017-07-31 (×6): qty 1

## 2017-07-31 MED ORDER — MORPHINE SULFATE (PF) 4 MG/ML IV SOLN
2.0000 mg | Freq: Once | INTRAVENOUS | Status: AC
  Administered 2017-07-31: 2 mg via INTRAVENOUS

## 2017-07-31 MED ORDER — METOCLOPRAMIDE HCL 5 MG PO TABS
2.5000 mg | ORAL_TABLET | Freq: Every day | ORAL | Status: DC
  Administered 2017-08-01 – 2017-08-03 (×3): 2.5 mg via ORAL
  Filled 2017-07-31 (×6): qty 1

## 2017-07-31 NOTE — ED Notes (Signed)
RT called for pt breathing treatment.

## 2017-07-31 NOTE — ED Notes (Signed)
RT at bedside - pt being placed back on Bi PAP

## 2017-07-31 NOTE — Progress Notes (Signed)
Patient placed on CPAP without complication.  
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
## 2017-07-31 NOTE — ED Notes (Addendum)
Per Dr. Caleb Popp give insulin even with NPO status.  Dr. Caleb Popp to review pts oral medications, aware that pt is NPO due to failing swallow screen.

## 2017-07-31 NOTE — ED Notes (Signed)
Requested bed from [facility].  Patient moving to A5.

## 2017-07-31 NOTE — ED Notes (Signed)
CBG 126  

## 2017-07-31 NOTE — Progress Notes (Signed)
PROGRESS NOTE    [name]  DUK:[id number] DOB: [birth year] DOA: 07/30/2017 PCP: Jac Canavan, PA-C   Brief Narrative: [name] is a 82 y.o. female with medical history significant ofdevelopmental disability, chronic anemia, history of recurrent DVT on chronic anticoagulation, asthma, diabetes, hypertension, dyslipidemia, HOCMand recent issues with recurrent falls. Patient presents with hypoxia secondary to HCAP and pleural effusion   Assessment & Plan:   Active Problems:   Diabetes type 2, controlled (HCC)   Essential hypertension   Moderate persistent asthma   Chronic pain syndrome   Mild mental retardation   Gastroesophageal reflux disease without esophagitis   Chronic kidney disease   Decubitus ulcer of right buttock, stage 1   Hypertrophic obstructive cardiomyopathy (HOCM) (HCC)   Type 2 diabetes mellitus with stage 3 chronic kidney disease (HCC)   Closed traumatic displaced fracture of five ribs of right side   Falls frequently   Pleural effusion on right   Acute respiratory failure with hypoxia (HCC)   Sepsis due to pneumonia (HCC)   HCAP (healthcare-associated pneumonia)   Acute respiratory failure with hypoxia Secondary to HCAP and pleural effusion. Patient is DNR and family not wanting invasive management. PCCM consulted last night and are holding off of thoracentesis. -keep O2 >90% -palliative care consulted  HCAP -continue Vancomycin and cefepime -sputum culture -blood cultures  Sepsis Secondary to HCAP. Present on admission. -blood cultures pending  Pleural effusion Possibly hemothorax. No thoracentesis at this time per PCCM and family discussion. -continue symptom management with oxygen therapy  CKD stage 3 Stable. -avoid nephrotoxic agents  Chronic pain syndrome -morphine prn -palliative care recommendations  Multiple rib fractures -symptomatic management  Mild mental retardation Chronic.  Diabetes mellitus -continue  SSI  Tremor Chronic. Plan to switch from Depakote to Lamictal -transition off of Depakote -continue Lamictal  History of DVT -continue coumadin  GERD -continue protonix  HOCM -continue metoprolol  Asthma -continue Duoneb prn -continue Pulmicort   DVT prophylaxis: Coumadin Code Status: DNR Family Communication: Sister in law Disposition Plan: Discharge pending workup   Consultants:   Palliative care medicine  Procedures:   Bipap  Antimicrobials:  Vancomycin  Cefepime    Subjective: Patient unable to verbalize at this time.  Objective: Vitals:   07/31/17 1000 07/31/17 1015 07/31/17 1030 07/31/17 1045  BP: 129/61 122/61 132/66 137/71  Pulse:      Resp:   (!) 27 (!) 21  Temp:      TempSrc:      SpO2: 99% 99% 100% 100%  Weight:      Height:        Intake/Output Summary (Last 24 hours) at 07/31/17 1137 Last data filed at 07/30/17 2336  Gross per 24 hour  Intake              803 ml  Output                0 ml  Net              803 ml   Filed Weights   07/30/17 1915  Weight: 60.8 kg (134 lb)    Examination:  General exam: Appears calm and comfortable Respiratory system: Diminished bilaterally with scattered rhonchi. Respiratory effort normal. Cardiovascular system: S1 & S2 heard, RRR. No murmurs, rubs, gallops or clicks. Gastrointestinal system: Abdomen is nondistended, soft and nontender. No organomegaly or masses felt. Normal bowel sounds heard. Central nervous system: Arouses to voice. Flapping tremor of UE, worse on right. Extremities:  No edema. No calf tenderness    Data Reviewed: I have personally reviewed following labs and imaging studies  CBC:  Recent Labs Lab 07/27/17 0409 07/28/17 0458 07/29/17 0456 07/30/17 1915 07/31/17 0420  WBC 4.7 4.4 5.5 24.4* 11.4*  NEUTROABS  --   --   --  21.5*  --   HGB 9.9* 10.4* 10.1* 13.7 10.4*  HCT 31.1* 33.2* 31.9* 41.7 32.8*  MCV 94.0 94.9 94.7 94.8 94.0  PLT 120* 124* 131* 234 141*    Basic Metabolic Panel:  Recent Labs Lab 07/25/17 1015 07/29/17 0456 07/30/17 1915 07/31/17 0420  NA 141 140 140 140  K 4.4 4.2 4.9 4.2  CL 108 107 105 108  CO2 GLUCOSE 121* 110* 193* 98  BUN 30* 25* 31* 35*  CREATININE 1.53* 1.48* 1.66* 1.48*  CALCIUM 8.8* 8.0* 8.2* 7.4*  MG  --   --   --  2.0  PHOS  --   --   --  2.6   GFR: Estimated Creatinine Clearance: 29.1 mL/min (A) (by C-G formula based on SCr of 1.48 mg/dL (H)). Liver Function Tests:  Recent Labs Lab 07/30/17 1915 07/31/17 0420  AST 42* 30  ALT 22 18  ALKPHOS 95 70  BILITOT 0.6 0.3  PROT 7.1 5.3*  ALBUMIN 3.1* 2.4*   No results for input(s): LIPASE, AMYLASE in the last 168 hours. No results for input(s): AMMONIA in the last 168 hours. Coagulation Profile:  Recent Labs Lab 07/27/17 0409 07/28/17 0458 07/29/17 0456 07/30/17 1920 07/31/17 0420  INR 1.12 1.19 1.48 1.94 2.94   Cardiac Enzymes: No results for input(s): CKTOTAL, CKMB, CKMBINDEX, TROPONINI in the last 168 hours. BNP (last 3 results) No results for input(s): PROBNP in the last 8760 hours. HbA1C:  Recent Labs  07/30/17 2237  HGBA1C 6.0*   CBG:  Recent Labs Lab 07/31/17 0025 07/31/17 0521 07/31/17 0807 07/31/17 1031  GLUCAP 206* 118* 123* 126*   Lipid Profile: No results for input(s): CHOL, HDL, LDLCALC, TRIG, CHOLHDL, LDLDIRECT in the last 72 hours. Thyroid Function Tests:  Recent Labs  07/31/17 0421  TSH 1.350   Anemia Panel: No results for input(s): VITAMINB12, FOLATE, FERRITIN, TIBC, IRON, RETICCTPCT in the last 72 hours. Sepsis Labs:  Recent Labs Lab 07/30/17 1927 07/30/17 2237 07/31/17 0422  PROCALCITON  --  5.58  --   LATICACIDVEN 2.07* 1.9 0.9    No results found for this or any previous visit (from the past 240 hour(s)).       Radiology Studies: Dg Chest Port 1 View  Result Date: 07/31/2017 CLINICAL DATA:  Pleural effusion. EXAM: PORTABLE CHEST 1 VIEW COMPARISON:  07/30/2017  FINDINGS: Layering right pleural effusion. Numerous right rib fractures showing various stages of healing on this study and on recent chest CT. There is a small left pleural effusion. No visible pneumothorax. Stable lung inflation. Normal heart size when accounting for distortion by leftward rotation. IMPRESSION: 1. Layering moderate right and small left pleural effusions. Stable lung inflation, with extensive atelectasis seen on recent chest CT. 2. Numerous right rib fractures that are healing by CT. Electronically Signed   By: Marnee Spring M.D.   On: 07/31/2017 09:11   Dg Chest Port 1 View  Result Date: 07/30/2017 CLINICAL DATA:  Initial evaluation for acute fever with hypoxia. EXAM: PORTABLE CHEST 1 VIEW COMPARISON:  Prior radiograph from 07/29/2017. FINDINGS: Stable cardiomegaly. Mediastinal silhouette unchanged. Aortic atherosclerosis. Persistent moderate right pleural effusion. Scattered airspace opacities present  throughout the right lung, concerning for possible infiltrate given history of fever. Dense consolidation seen just superior to the right minor fissure. Probable superimposed component of atelectasis at the right lung base. Small left pleural effusion versus chronic pleural reaction. Left lung otherwise clear. Multiple right-sided rib fractures again noted. IMPRESSION: 1. Persistent moderate right pleural effusion. 2. Associated parenchymal opacity and congestion throughout the right lung, concerning for infiltrate given history of fever. 3. Blunting of the left costophrenic angle, which may reflect small effusion versus chronic pleural reaction. Electronically Signed   By: Rise Mu M.D.   On: 07/30/2017 20:10        Scheduled Meds: . budesonide  0.5 mg Nebulization BID  . divalproex  500 mg Oral Daily  . insulin aspart  0-9 Units Subcutaneous Q4H  . lamoTRIgine  25 mg Oral Daily  . metoprolol tartrate  2.5 mg Intravenous Q12H  . sodium chloride flush  3 mL Intravenous  Q12H   Continuous Infusions: . ceFEPime (MAXIPIME) IV    . vancomycin       LOS: 1 day     Jacquelin Hawking, MD [facility] 07/31/2017, 11:37 AM Pager: [phone]  If 7PM-7AM, please contact night-coverage [email or web address] Password [email or web address] 07/31/2017, 11:37 AM

## 2017-07-31 NOTE — ED Notes (Signed)
Dr. Nettey at bedside 

## 2017-07-31 NOTE — ED Notes (Addendum)
Pt alert with good head control at this time. Pt able to swallow water through a straw without difficulty, no coughing or gurgling quality to voice noted. Upon attempting to take ativan and buproprion pill, pt began coughing. Buproprion pill removed from patient's mouth whole by this RN. Ordered 0.5 ativan tablet dissolved in pt mouth and pt swallowed medication without difficulty. Will notify admitting.

## 2017-07-31 NOTE — ED Notes (Signed)
This RN called RT to assess pt to see if she needed to go back on BiPap

## 2017-07-31 NOTE — ED Notes (Signed)
Posey Mitts removed from pt, family at bedside

## 2017-07-31 NOTE — ED Notes (Signed)
Attempted to call report

## 2017-07-31 NOTE — ED Notes (Signed)
Pts posey mittens taken off with this RN in the room, no skin break down noted, PMS intact.  Posey mittens replaced prior to leaving room.

## 2017-07-31 NOTE — ED Notes (Signed)
Hospital bed ordered.

## 2017-07-31 NOTE — ED Notes (Signed)
Soft mitts applied to pt. Pt more calm and cooperative at this time. Pt on 4L [place], O2 sat 98-99%, RR 29-30. Will consult with RT and admitting regarding decision to keep pt off bipap.

## 2017-07-31 NOTE — Progress Notes (Addendum)
ANTICOAGULATION CONSULT NOTE - Initial Consult  Pharmacy Consult for warfarin  Indication: recurrent DVT   Allergies  Allergen Reactions  . Levofloxacin Other (See Comments)    unknown    Patient Measurements: Height: 5' 8"  (172.7 cm) Weight: 134 lb (60.8 kg) IBW/kg (Calculated) : 63.9  Vital Signs: Temp: 100.5 F (38.1 C) (10/03 2212) Temp Source: Rectal (10/03 2212) BP: 122/68 (10/04 0100) Pulse Rate: 86 (10/04 0100)  Labs:  Recent Labs  07/28/17 0458 07/29/17 0456 07/30/17 1915 07/30/17 1920  HGB 10.4* 10.1* 13.7  --   HCT 33.2* 31.9* 41.7  --   PLT 124* 131* 234  --   LABPROT 15.0 17.8*  --  22.0*  INR 1.19 1.48  --  1.94  CREATININE  --  1.48* 1.66*  --     Estimated Creatinine Clearance: 25.9 mL/min (A) (by C-G formula based on SCr of 1.66 mg/dL (H)).   Medical History: Past Medical History:  Diagnosis Date  . Allergy   . Anemia of chronic disease 10/2010   stable as of 2015, on iron therapy for mild iron deficiency, chronic kidney disease, anemia of chronic disease; Dr. Ralene Ok prior consult  . Chronic kidney disease   . Constipation    mild intermittent  . Depression    [facility] - NP Eugenie Norrie  . Diabetes mellitus   . Diabetes type 2, controlled ([place])   . Edentulous   . Falls   . GERD (gastroesophageal reflux disease)   . H/O cardiovascular stress test 06/2014   nuclear stress test normal; Dr. Wyatt Haste  . H/O echocardiogram 06/17/2014   mild LVH, EF 60-65%, no wall motion abnormalities // Echo 11/17: EF 55-60, normal wall motion, grade 2 diastolic dysfunction, MAC  . H/O mammogram 08/04/2014   normal  . History of MRI of brain and brain stem 11/2010   normal MRI of brain  . Hx of deep venous thrombosis    lifelong anticoagulant therapy  . Hyperlipidemia   . Hypertension   . Hypertrophic obstructive cardiomyopathy (HOCM) ([place]) 2015   normal echo and stress test other than mild LVH 06/2014; Dr. Dorris Carnes  . Long term  current use of anticoagulant therapy    due to hx/o recurrent DVT  . Mild mental retardation   . Moderate persistent asthma    asthma and bronchiectasis, pulm consult 2015; unable to do PFTs, 2015  . Mood disorder ([place])    [facility] - NP Eugenie Norrie  . Osteoarthritis of both knees    Dr. Frederik Pear  . Osteopenia 2013   improvements on Boniva and Ca+D from 2011-2013.  2013 Bone Density normal /improved; repeat Bone Density scan 2016  . PVD (peripheral vascular disease) ([place])   . Shortness of breath    06/2014 cardiac consult, SOB seems to be related to poor technique with handheld inhaler, switched to nebs with much improvement  . Thrombocytopenia ([place]) 10/2010   due to medications and immune dysregulation likely, stable as of 2015; no further investigation; hematology, Dr. Ralene Ok  . Tremor 2015   consult with Dr. Netta Neat Neurology.  Parkinsonian tremor without Parkinsons    Assessment: 82 yo female admitted with hypoxia. 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   Admission INR 1.94. Per [facility] from [facility], patient was receiving warfarin and xarelto concomitantly. Per MD notes, patient was supposed to have switched from Xarelto to Coumadin with  Lovenox bridge which occurred on previous admission.   She was recently inpatient at [facility] and was just discharged on 10/2. Pharmacy had been dosing Coumadin with Lovenox bridge.   Last dose of warfarin and Xarelto PTA today was on 10/3 PM. Lovenox was not charted as administered per facility [facility]. Last dose of lovenox administered on previous admission on 10/2 @ 1250pm.   CBC is stable and no s/s bleeding noted.   Goal of Therapy:  INR 2-3 Monitor platelets by anticoagulation protocol: Yes   Plan:  Xarelto discontinued from PTA list per MD  No warfarin tonight Daily INR and CBC Monitor for s/s bleeding  Argie Ramming, PharmD Clinical  Pharmacist 07/31/17 1:16 AM

## 2017-07-31 NOTE — ED Notes (Signed)
Unsuccessful attempt to start IV x 2. Pt with fragile veins, will place IV team consult.

## 2017-07-31 NOTE — ED Notes (Signed)
This called speech therapy and left message requesting return call for pts swallow screen.

## 2017-07-31 NOTE — ED Notes (Signed)
Per admitting, will place pt in soft restraints until mitts can be obtained to prevent pt from harming herself or hindering medical care. Will d/c restraints upon placing mitts on pt.

## 2017-07-31 NOTE — ED Notes (Signed)
RT aware of pt respiratory assessment and VS since being placed on [place]. Pt to continue with [place], bipap by the bedside if necessary.

## 2017-07-31 NOTE — ED Notes (Signed)
EKG given to Dr. Plunkett 

## 2017-07-31 NOTE — ED Notes (Addendum)
Pt becoming combative and attempting to remove bipap. Per respiratory and admitting will remove bipap and place pt on [place] to see how she tolerates [place]. Pt O2 remains at 96-97% on 4L [place] however pt visibly agitated. Pt continues to remove her , pt ripped off all cardiac leads and EKG stickers in addition to her pulse ox probe. Pt also attempted to remove her IV but was unsuccessful. Pt IV re dressed with tegaderm and tape. Per admitting, will administer 2mg  morphine IV. Pt noted to have had BM, full bedding changed by this RN and Tre, EMT.

## 2017-08-01 DIAGNOSIS — N183 Chronic kidney disease, stage 3 (moderate): Secondary | ICD-10-CM

## 2017-08-01 DIAGNOSIS — J9601 Acute respiratory failure with hypoxia: Secondary | ICD-10-CM

## 2017-08-01 DIAGNOSIS — Z515 Encounter for palliative care: Secondary | ICD-10-CM

## 2017-08-01 DIAGNOSIS — J189 Pneumonia, unspecified organism: Secondary | ICD-10-CM

## 2017-08-01 DIAGNOSIS — R296 Repeated falls: Secondary | ICD-10-CM

## 2017-08-01 LAB — BASIC METABOLIC PANEL
Anion gap: 6 (ref 5–15)
BUN: 38 mg/dL — AB (ref 6–20)
CALCIUM: 8.3 mg/dL — AB (ref 8.9–10.3)
CO2: 26 mmol/L (ref 22–32)
Chloride: 108 mmol/L (ref 101–111)
Creatinine, Ser: 1.52 mg/dL — ABNORMAL HIGH (ref 0.44–1.00)
GFR calc Af Amer: 36 mL/min — ABNORMAL LOW (ref >60)
GFR, EST NON AFRICAN AMERICAN: 31 mL/min — AB (ref >60)
GLUCOSE: 105 mg/dL — AB (ref 65–99)
Potassium: 4.8 mmol/L (ref 3.5–5.1)
Sodium: 140 mmol/L (ref 135–145)

## 2017-08-01 LAB — PROTIME-INR
INR: 1.44
PROTHROMBIN TIME: 17.4 s — AB (ref 11.4–15.2)

## 2017-08-01 LAB — GLUCOSE, CAPILLARY
Glucose-Capillary: 103 mg/dL — ABNORMAL HIGH (ref 65–99)
Glucose-Capillary: 116 mg/dL — ABNORMAL HIGH (ref 65–99)
Glucose-Capillary: 153 mg/dL — ABNORMAL HIGH (ref 65–99)
Glucose-Capillary: 188 mg/dL — ABNORMAL HIGH (ref 65–99)
Glucose-Capillary: 90 mg/dL (ref 65–99)

## 2017-08-01 LAB — CBC
HCT: 37.7 % (ref 36.0–46.0)
Hemoglobin: 11.4 g/dL — ABNORMAL LOW (ref 12.0–15.0)
MCH: 29.1 pg (ref 26.0–34.0)
MCHC: 30.2 g/dL (ref 30.0–36.0)
MCV: 96.2 fL (ref 78.0–100.0)
PLATELETS: 220 10*3/uL (ref 150–400)
RBC: 3.92 MIL/uL (ref 3.87–5.11)
RDW: 12.6 % (ref 11.5–15.5)
WBC: 12.3 10*3/uL — ABNORMAL HIGH (ref 4.0–10.5)

## 2017-08-01 LAB — MRSA PCR SCREENING: MRSA by PCR: NEGATIVE

## 2017-08-01 MED ORDER — WARFARIN - PHARMACIST DOSING INPATIENT: Freq: Every day | Status: DC

## 2017-08-01 MED ORDER — WARFARIN SODIUM 4 MG PO TABS
4.0000 mg | ORAL_TABLET | Freq: Once | ORAL | Status: AC
  Administered 2017-08-01: 4 mg via ORAL
  Filled 2017-08-01: qty 1

## 2017-08-01 MED ORDER — DIVALPROEX SODIUM ER 250 MG PO TB24
250.0000 mg | ORAL_TABLET | Freq: Every day | ORAL | Status: DC
  Administered 2017-08-02 – 2017-08-03 (×2): 250 mg via ORAL
  Filled 2017-08-01 (×3): qty 1

## 2017-08-01 MED ORDER — ENOXAPARIN SODIUM 80 MG/0.8ML ~~LOC~~ SOLN
65.0000 mg | SUBCUTANEOUS | Status: DC
  Administered 2017-08-01 – 2017-08-03 (×3): 65 mg via SUBCUTANEOUS
  Filled 2017-08-01 (×3): qty 0.8

## 2017-08-01 NOTE — Consult Note (Signed)
Consultation Note Date: 08/01/2017   Patient Name: [name]  DOB: 06-Oct-1935  MRN: [id number]  Age / Sex: 82 y.o., female  PCP: Glade Lloyd Camelia Eng, PA-C Referring Physician: Mariel Aloe, MD  Reason for Consultation: Establishing goals of care and Psychosocial/spiritual support  HPI/Patient Profile: 82 y.o. female  with past medical history of Developmental disability, recurrent DVT, diabetes, asthma, recurrent falls, multiple rib fractures, admission September 27 with new CVA just discharged to [facility] on 07/29/2017 and readmitted on 07/30/2017 with hypoxia, pneumonia, pleural effusion.  Consult ordered for goals of care  Clinical Assessment and Goals of Care: Patient has developmental disability, minimally verbal, oriented only to self. Her healthcare power of attorney, is her sister-in-law, [name] SIL [phone]. Spoke to [name], who shares she is seeing a sharp decline over the past 5-6 months as evidenced by decreased oral intake, recurrent falls, not as interactive, and now with a stroke in September. Patient had lived in a group home for a number of years but has now been too sick to return to that and was discharged to [facility]. She went to [facility] with the goal of rehabilitation but has been unable to participate. Her sister-in-law, [name], states that she feels that she is nearing end-of-life    SUMMARY OF RECOMMENDATIONS   DNR/DNI Return to [facility] with hospice support. Order placed to social work for hospice No thoracentesis No PEG tube or artificial nutrition Family recognizes that coming to the hospital is hard on patient and is hopeful with hospice support to minimize returning to the hospital Code Status/Advance Care Planning:  DNR    Symptom Management:   Nausea: Continue with Zofran as needed  Anxiety: Continue with Ativan as  needed  Dyspnea/pain: Continue with morphine 2 mg every 4 hours as needed  Involuntary generalized movements/questionable tardive dyskinesia: Continue with Lamictal as well as other antidepressants. If patient has recently been on neuroleptics such as Risperdal Zyprexa, Seroquel etc. stopping these agents would make her tremor much worse. I would recommend restarting low-dose Seroquel daily at bedtime  Palliative Prophylaxis:   Aspiration, Bowel Regimen, Delirium Protocol, Eye Care, Frequent Pain Assessment, Oral Care and Turn Reposition  Additional Recommendations (Limitations, Scope, Preferences):  Avoid Hospitalization, Minimize Medications, No Artificial Feeding, No Chemotherapy, No Hemodialysis, No Radiation, No Surgical Procedures and No Tracheostomy  Psycho-social/Spiritual:   Desire for further Chaplaincy support:no  Additional Recommendations: Grief/Bereavement Support  Prognosis:   Unable to determine  Discharge Planning: To Be Determined      Primary Diagnoses: Present on Admission: . Chronic kidney disease . Chronic pain syndrome . Closed traumatic displaced fracture of five ribs of right side . Decubitus ulcer of right buttock, stage 1 . Essential hypertension . Gastroesophageal reflux disease without esophagitis . Hypertrophic obstructive cardiomyopathy (HOCM) ([place]) . Mild mental retardation . Moderate persistent asthma . Pleural effusion on right . Type 2 diabetes mellitus with stage 3 chronic kidney disease ([place]) . Acute respiratory failure with hypoxia ([place]) . Sepsis  due to pneumonia ([place]) . HCAP (healthcare-associated pneumonia)   I have reviewed the medical record, interviewed the patient and family, and examined the patient. The following aspects are pertinent.  Past Medical History:  Diagnosis Date  . Allergy   . Anemia of chronic disease 10/2010   stable as of 2015, on iron therapy for mild iron deficiency, chronic kidney disease, anemia of  chronic disease; Dr. Ralene Ok prior consult  . Chronic kidney disease   . Constipation    mild intermittent  . Depression    [facility] - NP Eugenie Norrie  . Diabetes mellitus   . Diabetes type 2, controlled ([place])   . Edentulous   . Falls   . GERD (gastroesophageal reflux disease)   . H/O cardiovascular stress test 06/2014   nuclear stress test normal; Dr. Wyatt Haste  . H/O echocardiogram 06/17/2014   mild LVH, EF 60-65%, no wall motion abnormalities // Echo 11/17: EF 55-60, normal wall motion, grade 2 diastolic dysfunction, MAC  . H/O mammogram 08/04/2014   normal  . History of MRI of brain and brain stem 11/2010   normal MRI of brain  . Hx of deep venous thrombosis    lifelong anticoagulant therapy  . Hyperlipidemia   . Hypertension   . Hypertrophic obstructive cardiomyopathy (HOCM) ([place]) 2015   normal echo and stress test other than mild LVH 06/2014; Dr. Dorris Carnes  . Long term current use of anticoagulant therapy    due to hx/o recurrent DVT  . Mild mental retardation   . Moderate persistent asthma    asthma and bronchiectasis, pulm consult 2015; unable to do PFTs, 2015  . Mood disorder ([place])    [facility] - NP Eugenie Norrie  . Osteoarthritis of both knees    Dr. Frederik Pear  . Osteopenia 2013   improvements on Boniva and Ca+D from 2011-2013.  2013 Bone Density normal /improved; repeat Bone Density scan 2016  . PVD (peripheral vascular disease) ([place])   . Shortness of breath    06/2014 cardiac consult, SOB seems to be related to poor technique with handheld inhaler, switched to nebs with much improvement  . Thrombocytopenia ([place]) 10/2010   due to medications and immune dysregulation likely, stable as of 2015; no further investigation; hematology, Dr. Ralene Ok  . Tremor 2015   consult with Dr. Netta Neat Neurology.  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 Marital status: Single    Spouse name: N/A  .  Number of children: 0  . Years of education: Elem   Occupational History  .  Other   Social History Main Topics  . Smoking status: Former Smoker    Types: Cigarettes    Quit date: 10/29/1999  . Smokeless tobacco: Never Used  . Alcohol use No  . Drug use: No  . Sexual activity: Not Asked     Comment: lives in [place]   Other Topics Concern  . None   Social History Narrative   Mild mental retardation   Has nearby brother and sister in law.  No other family remaining.   Prior has volunteered at [employer] and [employer].   09/04/16 currently living at [facility], moved there due to mult falls, overall decline.  Was with [facility] prior   Limited walking in the group home.   No significant exercise      Family History  Problem Relation Age of Onset  .  Alzheimer's disease Mother   . Other Father        car accident  . Heart attack Neg Hx    Scheduled Meds: . atorvastatin  40 mg Oral q1800  . budesonide  0.5 mg Nebulization BID  . buPROPion  150 mg Oral BID  . [START ON 08/02/2017] divalproex  250 mg Oral Daily  . FLUoxetine  40 mg Oral Daily  . insulin aspart  0-9 Units Subcutaneous Q4H  . lamoTRIgine  25 mg Oral Daily  . metoCLOPramide  2.5 mg Oral Q breakfast  . metoprolol tartrate  12.5 mg Oral BID  . sodium chloride flush  3 mL Intravenous Q12H   Continuous Infusions: . ceFEPime (MAXIPIME) IV Stopped (07/31/17 1840)  . vancomycin Stopped (07/31/17 2231)   PRN Meds:.acetaminophen **OR** acetaminophen, bisacodyl, levalbuterol, LORazepam, morphine injection, ondansetron **OR** ondansetron (ZOFRAN) IV, polyethylene glycol Medications Prior to Admission:  Prior to Admission medications   Medication Sig Start Date End Date Taking? Authorizing Provider  acetaminophen (TYLENOL) 325 MG tablet Take 2 tablets (650 mg total) by mouth every 4 (four) hours as needed for mild pain (or temp > 37.5 C (99.5 F)). 07/28/17  Yes Rai, Ripudeep K, MD   albuterol (PROVENTIL) (2.5 MG/3ML) 0.083% nebulizer solution Take 3 mLs (2.5 mg total) by nebulization every 6 (six) hours as needed for wheezing or shortness of breath (dx J47.9    I42.2). 12/04/16  Yes Tysinger, Camelia Eng, PA-C  atorvastatin (LIPITOR) 40 MG tablet Take 1 tablet (40 mg total) by mouth daily at 6 PM. 07/28/17  Yes Rai, Ripudeep K, MD  budesonide (PULMICORT) 0.5 MG/2ML nebulizer solution TAKE 2 ml (0.84m) BY NEBULIZER TWICE DAILY 12/04/16  Yes Tysinger, DCamelia Eng PA-C  buPROPion ([facility]) 150 MG 12 hr tablet Take 150 mg by mouth 2 (two) times daily.   Yes [provider]  Calcium Carbonate-Vitamin D3 (CALCIUM 600-D) 600-400 MG-UNIT TABS Take 2 tablets by mouth every evening.   Yes [provider]  divalproex (DEPAKOTE ER) 500 MG 24 hr tablet Take 1 tablet (500 mg total) by mouth daily. For 2 more days, then wean off 07/29/17  Yes Rai, Ripudeep K, MD  docusate sodium (COLACE) 100 MG capsule Take 1 capsule (100 mg total) by mouth 2 (two) times daily. Prn 12/04/16  Yes Tysinger, DCamelia Eng PA-C  enoxaparin (LOVENOX) 60 MG/0.6ML injection Inject 0.6 mLs (60 mg total) into the skin daily. Until INR above 2, bridge with Coumadin 07/29/17  Yes Rai, Ripudeep K, MD  FLUoxetine (PROZAC) 40 MG capsule Take 40 mg by mouth daily.     Yes [provider]  fluticasone (FLONASE) 50 MCG/ACT nasal spray Place 2 sprays into both nostrils daily. 12/04/16  Yes Tysinger, DCamelia Eng PA-C  furosemide (LASIX) 20 MG tablet Take 1 tablet (20 mg total) by mouth daily. 12/04/16  Yes Tysinger, DCamelia Eng PA-C  HYDROcodone-acetaminophen (NORCO) 5-325 MG tablet Take 1 tablet by mouth every 6 (six) hours as needed for moderate pain. Pt receives at 8am and 9pm 07/28/17  Yes Rai, Ripudeep K, MD  hydroxypropyl methylcellulose / hypromellose (ISOPTO TEARS / GONIOVISC) 2.5 % ophthalmic solution Place 1 drop into both eyes 4 (four) times daily.   Yes [provider]  isosorbide mononitrate (IMDUR) 30 MG  24 hr tablet Take 1 tablet (30 mg total) by mouth daily. 12/04/16  Yes Tysinger, DCamelia Eng PA-C  lamoTRIgine (LAMICTAL) 25 MG tablet Take 1 tablet (25 mg total) by mouth daily. 07/29/17  Yes  Rai, Vernelle Emerald, MD  loratadine (QC LORATADINE ALLERGY RELIEF) 10 MG tablet Take 1 tablet (10 mg total) by mouth daily. 12/04/16  Yes Tysinger, Camelia Eng, PA-C  LORazepam (ATIVAN) 0.5 MG tablet Take 1 tablet (0.5 mg total) by mouth 3 (three) times daily. And as needed Patient taking differently: Take 0.5 mg by mouth every 8 (eight) hours as needed for anxiety.  07/28/17  Yes Rai, Ripudeep K, MD  metoCLOPramide (REGLAN) 5 MG tablet Take 0.5 tablets (2.5 mg total) by mouth daily. 07/29/17  Yes Rai, Ripudeep K, MD  metoprolol tartrate (LOPRESSOR) 25 MG tablet Take 0.5 tablets (12.5 mg total) by mouth 2 (two) times daily. 12/05/16  Yes Tysinger, Camelia Eng, PA-C  montelukast (SINGULAIR) 10 MG tablet Take 1 tablet (10 mg total) by mouth at bedtime. 12/05/16  Yes Tysinger, Camelia Eng, PA-C  Multiple Vitamin (MULTIVITAMIN) tablet Take 1 tablet by mouth daily. 12/04/16  Yes Tysinger, Camelia Eng, PA-C  omeprazole (PRILOSEC) 20 MG capsule Take 1 capsule (20 mg total) by mouth daily. 12/05/16  Yes Tysinger, Camelia Eng, PA-C  PROLIA 60 MG/ML SOLN injection Inject 60 mg as directed every 6 (six) months. 05/12/17  Yes [provider]  warfarin (COUMADIN) 2 MG tablet Take 2 mg by mouth daily.   Yes [provider]  glucose blood (ACCU-CHEK AVIVA PLUS) test strip CHECK BLOOD GLUCOSE (SUGAR) 1 OR 2 TIMESA WEEK Patient not taking: Reported on 07/30/2017 12/04/16   Carlena Hurl, PA-C  PRODIGY TWIST TOP LANCETS 28G MISC TWICE weekly Patient not taking: Reported on 07/30/2017 12/05/16   Tysinger, Camelia Eng, PA-C   Allergies  Allergen Reactions  . Levofloxacin Other (See Comments)    unknown   Review of Systems  Unable to perform ROS: Dementia    Physical Exam  Constitutional: She appears well-developed.  Acutely ill appearing elderly  female with upper and lower extremity involuntary movements severe enough that unable to take a pulse manually. She states "I'm sick"  HENT:  Head: Normocephalic.  Cardiovascular:  Tachycardic Blood pressure elevated 191/151  Neurological: She is alert.  Oriented only to herself Minimally verbal; states "I'm sick"; points to her stomach but otherwise is not conversant  Skin: Skin is warm and dry.  Face is flushed  Psychiatric:  Appears anxious  Nursing note and vitals reviewed.   Vital Signs: BP (!) 160/118 (BP Location: Right Arm)   Pulse 78   Temp 98.1 F (36.7 C) (Axillary)   Resp (!) 21   Ht _0  (1.727 m)   Wt 65.8 kg (145 lb)   SpO2 91%   BMI 22.05 kg/m  Pain Assessment: 0-10   Pain Score: 0-No pain   SpO2: SpO2: 91 % O2 Device:SpO2: 91 % O2 Flow Rate: .O2 Flow Rate (L/min): 4 L/min  IO: Intake/output summary:  Intake/Output Summary (Last 24 hours) at 08/01/17 1005 Last data filed at 08/01/17 0600  Gross per 24 hour  Intake              590 ml  Output              250 ml  Net              340 ml    LBM: Last BM Date: 07/30/17 (per pt report) Baseline Weight: Weight: 60.8 kg (134 lb) Most recent weight: Weight: 65.8 kg (145 lb)     Palliative Assessment/Data:   Flowsheet Rows     Most Recent Value  Intake Tab  Referral Department  Hospitalist  Unit at Time of Referral  Med/Surg Unit  Palliative Care Primary Diagnosis  Pulmonary  Date Notified  07/30/17  Palliative Care Type  New Palliative care  Reason for referral  Clarify Goals of Care  Date of Admission  07/30/17  Date first seen by Palliative Care  08/01/17  # of days Palliative referral response time  2 Day(s)  # of days IP prior to Palliative referral  0  Clinical Assessment  Palliative Performance Scale Score  30%  Pain Max last 24 hours  Not able to report  Pain Min Last 24 hours  Not able to report  Dyspnea Max Last 24 Hours  Not able to report  Dyspnea Min Last 24 hours  Not able to  report  Nausea Max Last 24 Hours  Not able to report  Nausea Min Last 24 Hours  Not able to report  Anxiety Max Last 24 Hours  Not able to report  Anxiety Min Last 24 Hours  Not able to report  Other Max Last 24 Hours  Not able to report  Psychosocial & Spiritual Assessment  Palliative Care Outcomes  Patient/Family meeting held?  Yes  Who was at the meeting?  pt, SIL HCPOA  Palliative Care follow-up planned  Yes, Facility      Time In: 0930 Time Out: 1045 Time Total: 75 min Greater than 50%  of this time was spent counseling and coordinating care related to the above assessment and plan. Satffed with Dr. Teryl Lucy  Signed by: Dory Horn, NP   Please contact Palliative Medicine Team phone at [phone] for questions and concerns.  For individual provider: See Shea Evans

## 2017-08-01 NOTE — Consult Note (Signed)
   [facility] CM Inpatient Consult   08/01/2017  [name] 07/30/1935 [id number]    Screened for potential [facility] Care Management needs due to readmission.  Chart reviewed and spoke with inpatient RNCM.  Palliative medicine following. Patient from [facility].  No identifiable [facility] Care Management needs.   Raiford Noble, MSN-Ed, RN,BSN [facility] Liaison [phone]

## 2017-08-01 NOTE — NC FL2 (Signed)
[place] MEDICAID FL2 LEVEL OF CARE SCREENING TOOL     IDENTIFICATION  Patient Name: [name] Birthdate: [birth year] Sex: female Admission Date (Current Location): 07/30/2017  [facility] and [place] Number:  Producer, television/film/video and Address:  [facility], [address], [place], [place] [address]      Provider Number: [id number]  Attending Physician Name and Address:  Narda Bonds, MD  Relative Name and Phone Number:  Anat Zink, [phone]    Current Level of Care: Hospital Recommended Level of Care: Skilled Nursing Facility Prior Approval Number:    Date Approved/Denied:   PASRR Number: [id number] End date 08/27/17  Discharge Plan: SNF    Current Diagnoses: Patient Active Problem List   Diagnosis Date Noted  . Palliative care by specialist   . Acute respiratory failure with hypoxia (HCC) 07/30/2017  . Sepsis due to pneumonia (HCC) 07/30/2017  . HCAP (healthcare-associated pneumonia) 07/30/2017  . Stroke-like episode (HCC) 07/25/2017  . Cerebellar stroke (HCC) 07/25/2017  . Anemia 07/25/2017  . Hyperlipidemia 07/25/2017  . Type 2 diabetes mellitus with stage 3 chronic kidney disease (HCC) 07/25/2017  . Developmental disability 07/25/2017  . Asthma 07/25/2017  . HTN (hypertension) 07/25/2017  . History of recurrent deep vein thrombosis (DVT) 07/25/2017  . Closed traumatic displaced fracture of five ribs of right side 07/25/2017  . Falls frequently 07/25/2017  . Pleural effusion on right 07/25/2017  . CKD (chronic kidney disease), stage III (HCC) 07/25/2017  . Acute back pain 07/01/2017  . Fall 07/01/2017  . History of recent fall 04/08/2017  . Abrasion of right arm 04/08/2017  . Chronic bilateral low back pain without sciatica 04/08/2017  . Arm contusion, right, initial encounter 04/08/2017  . Medicare annual wellness visit, subsequent 12/04/2016  . Decubitus ulcer of right buttock, stage 1 12/02/2016  . Weight loss 12/02/2016   . Anorexia 12/02/2016  . Acute cystitis without hematuria 08/16/2016  . Memory change 08/01/2016  . Need for prophylactic vaccination and inoculation against influenza 08/01/2016  . Encounter for health maintenance examination in adult 09/11/2015  . Chronic pain syndrome 09/11/2015  . Type 2 diabetes mellitus with complication, without long-term current use of insulin (HCC) 09/11/2015  . Mild mental retardation 09/11/2015  . History of DVT (deep vein thrombosis) 09/11/2015  . Frequent falls 09/11/2015  . Need for prophylactic vaccination against Streptococcus pneumoniae (pneumococcus) 09/11/2015  . Gastroesophageal reflux disease without esophagitis 09/11/2015  . Peripheral vascular disease (HCC) 09/11/2015  . Impaired mobility 09/11/2015  . Chronic kidney disease 09/11/2015  . Senile purpura (HCC) 09/11/2015  . Moderate persistent asthma 07/11/2015  . Allergic rhinitis 05/31/2015  . Bronchiectasis without acute exacerbation (HCC) 11/29/2014  . Diabetes type 2, controlled (HCC) 09/09/2014  . Chronic kidney disease (CKD) 09/09/2014  . High risk medication use 09/09/2014  . Primary osteoarthritis of both knees 09/09/2014  . Anemia of chronic disease 09/09/2014  . Long term current use of anticoagulant therapy 09/09/2014  . Essential hypertension 09/09/2014  . Depression 09/09/2014  . Mood disorder in conditions classified elsewhere 09/09/2014  . Osteopenia 09/09/2014  . Abnormal involuntary movement 05/20/2014  . Hypertrophic obstructive cardiomyopathy (HOCM) (HCC) 10/28/2013    Orientation RESPIRATION BLADDER Height & Weight     Self, Place  Normal Incontinent Weight: 65.8 kg (145 lb) Height:  5\' 8"  (172.7 cm)  BEHAVIORAL SYMPTOMS/MOOD NEUROLOGICAL BOWEL NUTRITION STATUS      Incontinent Diet (Please see DC summary)  AMBULATORY STATUS COMMUNICATION OF NEEDS Skin  Extensive Assist Verbally Normal                       Personal Care Assistance Level of Assistance   Bathing, Feeding, Dressing Bathing Assistance: Maximum assistance Feeding assistance: Limited assistance Dressing Assistance: Limited assistance     Functional Limitations Info  Sight, Hearing, Speech Sight Info: Adequate Hearing Info: Adequate Speech Info: Impaired    SPECIAL CARE FACTORS FREQUENCY  PT (By licensed PT), OT (By licensed OT)                    Contractures Contractures Info: Not present    Additional Factors Info  Code Status, Allergies Code Status Info: DNR Allergies Info: Levofloxacin           Current Medications (08/01/2017):  This is the current hospital active medication list Current Facility-Administered Medications  Medication Dose Route Frequency Provider Last Rate Last Dose  . acetaminophen (TYLENOL) tablet 650 mg  650 mg Oral Q6H PRN Therisa Doyne, MD       Or  . acetaminophen (TYLENOL) suppository 650 mg  650 mg Rectal Q6H PRN Doutova, Anastassia, MD      . atorvastatin (LIPITOR) tablet 40 mg  40 mg Oral q1800 Narda Bonds, MD   40 mg at 07/31/17 1628  . bisacodyl (DULCOLAX) suppository 10 mg  10 mg Rectal Daily PRN Doutova, Anastassia, MD      . budesonide (PULMICORT) nebulizer solution 0.5 mg  0.5 mg Nebulization BID Doutova, Anastassia, MD   0.5 mg at 08/01/17 0726  . buPROPion (WELLBUTRIN SR) 12 hr tablet 150 mg  150 mg Oral BID Narda Bonds, MD   150 mg at 08/01/17 0950  . ceFEPIme (MAXIPIME) 1 g in dextrose 5 % 50 mL IVPB  1 g Intravenous Q24H Almon Hercules, [place]   Stopped at 07/31/17 1840  . [START ON 08/02/2017] divalproex (DEPAKOTE ER) 24 hr tablet 250 mg  250 mg Oral Daily Jacquelin Hawking A, MD      . enoxaparin (LOVENOX) injection 65 mg  65 mg Subcutaneous Q24H Narda Bonds, MD   65 mg at 08/01/17 1314  . FLUoxetine (PROZAC) capsule 40 mg  40 mg Oral Daily Narda Bonds, MD   40 mg at 08/01/17 0954  . insulin aspart (novoLOG) injection 0-9 Units  0-9 Units Subcutaneous Q4H Therisa Doyne, MD   2 Units at  08/01/17 1313  . lamoTRIgine (LAMICTAL) tablet 25 mg  25 mg Oral Daily Therisa Doyne, MD   25 mg at 08/01/17 0954  . levalbuterol (XOPENEX) nebulizer solution 0.63 mg  0.63 mg Nebulization Q2H PRN Doutova, Anastassia, MD      . LORazepam (ATIVAN) tablet 0.5 mg  0.5 mg Oral TID PRN Narda Bonds, MD   0.5 mg at 08/01/17 0953  . metoCLOPramide (REGLAN) tablet 2.5 mg  2.5 mg Oral Q breakfast Narda Bonds, MD   2.5 mg at 08/01/17 1000  . metoprolol tartrate (LOPRESSOR) tablet 12.5 mg  12.5 mg Oral BID Narda Bonds, MD   12.5 mg at 08/01/17 0954  . morphine 4 MG/ML injection 2 mg  2 mg Intravenous Q4H PRN Narda Bonds, MD      . ondansetron (ZOFRAN) tablet 4 mg  4 mg Oral Q6H PRN Doutova, Anastassia, MD       Or  . ondansetron (ZOFRAN) injection 4 mg  4 mg Intravenous Q6H PRN Therisa Doyne, MD   4 mg  at 08/01/17 0951  . polyethylene glycol (MIRALAX / GLYCOLAX) packet 17 g  17 g Oral Daily PRN Doutova, Anastassia, MD      . sodium chloride flush (NS) 0.9 % injection 3 mL  3 mL Intravenous Q12H Doutova, Anastassia, MD   3 mL at 08/01/17 0955  . vancomycin (VANCOCIN) IVPB 750 mg/150 ml premix  750 mg Intravenous Q24H Almon Hercules, [place]   Stopped at 07/31/17 2231  . warfarin (COUMADIN) tablet 4 mg  4 mg Oral ONCE-1800 Narda Bonds, MD      . Warfarin - Pharmacist Dosing Inpatient   Does not apply q1800 Narda Bonds, MD         Discharge Medications: Please see discharge summary for a list of discharge medications.  Relevant Imaging Results:  Relevant Lab Results:   Additional Information SS#[id number]  Mearl Latin, LCSWA

## 2017-08-01 NOTE — Progress Notes (Signed)
RT placed pt on CPAP with bipap settings of I 12 and E 6. With 4lpm bled through pt tolerating well. Sat stable. RT to monitor as needed. 

## 2017-08-01 NOTE — Progress Notes (Signed)
PROGRESS NOTE    WHITLEIGH GARRAMONE  ZOX:[id number] DOB: [birth year] DOA: 07/30/2017 PCP: Jac Canavan, PA-C   Brief Narrative: CALIYAH SIEH is a 82 y.o. female with medical history significant ofdevelopmental disability, chronic anemia, history of recurrent DVT on chronic anticoagulation, asthma, diabetes, hypertension, dyslipidemia, HOCMand recent issues with recurrent falls. Patient presents with hypoxia secondary to HCAP and pleural effusion.   Assessment & Plan:   Active Problems:   Diabetes type 2, controlled (HCC)   Essential hypertension   Moderate persistent asthma   Chronic pain syndrome   Mild mental retardation   Gastroesophageal reflux disease without esophagitis   Chronic kidney disease   Decubitus ulcer of right buttock, stage 1   Hypertrophic obstructive cardiomyopathy (HOCM) (HCC)   Type 2 diabetes mellitus with stage 3 chronic kidney disease (HCC)   Closed traumatic displaced fracture of five ribs of right side   Falls frequently   Pleural effusion on right   Acute respiratory failure with hypoxia (HCC)   Sepsis due to pneumonia (HCC)   HCAP (healthcare-associated pneumonia)   Acute respiratory failure with hypoxia Secondary to HCAP and pleural effusion. Patient is DNR and family not wanting invasive management. PCCM consulted last night and are holding off of thoracentesis. -keep O2 >90% -palliative care consulted  HCAP -continue Vancomycin and cefepime -sputum culture -blood cultures  Sepsis Secondary to HCAP. Present on admission. -blood cultures pending  Pleural effusion Possibly hemothorax. No thoracentesis at this time per PCCM and family discussion. -continue symptom management with oxygen therapy  CKD stage 3 Stable. -avoid nephrotoxic agents  Chronic pain syndrome -morphine prn -palliative care recommendations  Multiple rib fractures -symptomatic management  Mild mental disability Chronic.  Diabetes mellitus -continue  SSI  Tremor Seen by neurology at last admission. Plan to switch from Depakote to Lamictal. Improves when patient is asleep. -transition off of Depakote (  for 10 days starting 10/6) -continue Lamictal  History of DVT History of CVA -continue coumadin with Lovenox bridge  GERD -continue protonix  HOCM -continue metoprolol  Asthma -continue Duoneb prn -continue Pulmicort   DVT prophylaxis: Coumadin and Lovenox Code Status: DNR Family Communication: none at bedside Disposition Plan: Discharge pending workup   Consultants:   Palliative care medicine  Procedures:   Bipap  Antimicrobials:  Vancomycin  Cefepime    Subjective: No complaints today.  Objective: Vitals:   08/01/17 0600 08/01/17 0727 08/01/17 0739 08/01/17 0915  BP:   (!) 189/71 (!) 160/118  Pulse:      Resp:      Temp:   98.1 F (36.7 C)   TempSrc:   Axillary   SpO2: 98% (!) 87% 94% 91%  Weight:      Height:        Intake/Output Summary (Last 24 hours) at 08/01/17 1040 Last data filed at 08/01/17 0600  Gross per 24 hour  Intake              590 ml  Output              250 ml  Net              340 ml   Filed Weights   07/30/17 1915 08/01/17 0528  Weight: 60.8 kg (134 lb) 65.8 kg (145 lb)    Examination:  General exam: Appears calm and comfortable Respiratory system: Diminished bilaterally with scattered rhonchi. Respiratory effort normal. Cardiovascular system: S1 & S2 heard, RRR. No murmurs, rubs, gallops or clicks. Gastrointestinal system: Soft,  non-tender, non-distended, no guarding, no rebound, no masses felt Central nervous system: Arouses to voice. Not answering questions appropriately. Flapping tremor of UE Extremities: No edema. No calf tenderness    Data Reviewed: I have personally reviewed following labs and imaging studies  CBC:  Recent Labs Lab 07/28/17 0458 07/29/17 0456 07/30/17 1915 07/31/17 0420 08/01/17 0654  WBC 4.4 5.5 24.4* 11.4* 12.3*   NEUTROABS  --   --  21.5*  --   --   HGB 10.4* 10.1* 13.7 10.4* 11.4*  HCT 33.2* 31.9* 41.7 32.8* 37.7  MCV 94.9 94.7 94.8 94.0 96.2  PLT 124* 131* 234 141* 220   Basic Metabolic Panel:  Recent Labs Lab 07/29/17 0456 07/30/17 1915 07/31/17 0420 08/01/17 0654  NA 140 140 140 140  K 4.2 4.9 4.2 4.8  CL 107 105 108 108  CO2 GLUCOSE 110* 193* 98 105*  BUN 25* 31* 35* 38*  CREATININE 1.48* 1.66* 1.48* 1.52*  CALCIUM 8.0* 8.2* 7.4* 8.3*  MG  --   --  2.0  --   PHOS  --   --  2.6  --    GFR: Estimated Creatinine Clearance: 29.8 mL/min (A) (by C-G formula based on SCr of 1.52 mg/dL (H)). Liver Function Tests:  Recent Labs Lab 07/30/17 1915 07/31/17 0420  AST 42* 30  ALT 22 18  ALKPHOS 95 70  BILITOT 0.6 0.3  PROT 7.1 5.3*  ALBUMIN 3.1* 2.4*   No results for input(s): LIPASE, AMYLASE in the last 168 hours. No results for input(s): AMMONIA in the last 168 hours. Coagulation Profile:  Recent Labs Lab 07/28/17 0458 07/29/17 0456 07/30/17 1920 07/31/17 0420 08/01/17 0654  INR 1.19 1.48 1.94 2.94 1.44   Cardiac Enzymes: No results for input(s): CKTOTAL, CKMB, CKMBINDEX, TROPONINI in the last 168 hours. BNP (last 3 results) No results for input(s): PROBNP in the last 8760 hours. HbA1C:  Recent Labs  07/30/17 2237  HGBA1C 6.0*   CBG:  Recent Labs Lab 07/31/17 1031 07/31/17 1245 07/31/17 1627 07/31/17 2125 08/01/17 0526  GLUCAP 126* 110* 119* 99 116*   Lipid Profile: No results for input(s): CHOL, HDL, LDLCALC, TRIG, CHOLHDL, LDLDIRECT in the last 72 hours. Thyroid Function Tests:  Recent Labs  07/31/17 0421  TSH 1.350   Anemia Panel: No results for input(s): VITAMINB12, FOLATE, FERRITIN, TIBC, IRON, RETICCTPCT in the last 72 hours. Sepsis Labs:  Recent Labs Lab 07/30/17 1927 07/30/17 2237 07/31/17 0422  PROCALCITON  --  5.58  --   LATICACIDVEN 2.07* 1.9 0.9    Recent Results (from the past 240 hour(s))  Blood Culture  (routine x 2)     Status: None (Preliminary result)   Collection Time: 07/30/17  7:00 PM  Result Value Ref Range Status   Specimen Description BLOOD RIGHT FOREARM  Final   Special Requests IN PEDIATRIC BOTTLE Blood Culture adequate volume  Final   Culture NO GROWTH < 24 HOURS  Final   Report Status PENDING  Incomplete  Blood Culture (routine x 2)     Status: None (Preliminary result)   Collection Time: 07/30/17  7:09 PM  Result Value Ref Range Status   Specimen Description BLOOD RIGHT ANTECUBITAL  Final   Special Requests   Final    BOTTLES DRAWN AEROBIC AND ANAEROBIC Blood Culture adequate volume   Culture NO GROWTH < 24 HOURS  Final   Report Status PENDING  Incomplete  MRSA PCR Screening     Status:  None   Collection Time: 07/31/17  9:46 PM  Result Value Ref Range Status   MRSA by PCR NEGATIVE NEGATIVE Final    Comment:        The GeneXpert MRSA Assay (FDA approved for NASAL specimens only), is one component of a comprehensive MRSA colonization surveillance program. It is not intended to diagnose MRSA infection nor to guide or monitor treatment for MRSA infections.          Radiology Studies: Dg Chest Port 1 View  Result Date: 07/31/2017 CLINICAL DATA:  Pleural effusion. EXAM: PORTABLE CHEST 1 VIEW COMPARISON:  07/30/2017 FINDINGS: Layering right pleural effusion. Numerous right rib fractures showing various stages of healing on this study and on recent chest CT. There is a small left pleural effusion. No visible pneumothorax. Stable lung inflation. Normal heart size when accounting for distortion by leftward rotation. IMPRESSION: 1. Layering moderate right and small left pleural effusions. Stable lung inflation, with extensive atelectasis seen on recent chest CT. 2. Numerous right rib fractures that are healing by CT. Electronically Signed   By: Marnee Spring M.D.   On: 07/31/2017 09:11   Dg Chest Port 1 View  Result Date: 07/30/2017 CLINICAL DATA:  Initial evaluation  for acute fever with hypoxia. EXAM: PORTABLE CHEST 1 VIEW COMPARISON:  Prior radiograph from 07/29/2017. FINDINGS: Stable cardiomegaly. Mediastinal silhouette unchanged. Aortic atherosclerosis. Persistent moderate right pleural effusion. Scattered airspace opacities present throughout the right lung, concerning for possible infiltrate given history of fever. Dense consolidation seen just superior to the right minor fissure. Probable superimposed component of atelectasis at the right lung base. Small left pleural effusion versus chronic pleural reaction. Left lung otherwise clear. Multiple right-sided rib fractures again noted. IMPRESSION: 1. Persistent moderate right pleural effusion. 2. Associated parenchymal opacity and congestion throughout the right lung, concerning for infiltrate given history of fever. 3. Blunting of the left costophrenic angle, which may reflect small effusion versus chronic pleural reaction. Electronically Signed   By: Rise Mu M.D.   On: 07/30/2017 20:10        Scheduled Meds: . atorvastatin  40 mg Oral q1800  . budesonide  0.5 mg Nebulization BID  . buPROPion  150 mg Oral BID  . [START ON 08/02/2017] divalproex  250 mg Oral Daily  . FLUoxetine  40 mg Oral Daily  . insulin aspart  0-9 Units Subcutaneous Q4H  . lamoTRIgine  25 mg Oral Daily  . metoCLOPramide  2.5 mg Oral Q breakfast  . metoprolol tartrate  12.5 mg Oral BID  . sodium chloride flush  3 mL Intravenous Q12H  . warfarin  4 mg Oral ONCE-1800  . Warfarin - Pharmacist Dosing Inpatient   Does not apply q1800   Continuous Infusions: . ceFEPime (MAXIPIME) IV Stopped (07/31/17 1840)  . vancomycin Stopped (07/31/17 2231)     LOS: 2 days     Jacquelin Hawking, MD [facility] 08/01/2017, 10:40 AM Pager: [phone]  If 7PM-7AM, please contact night-coverage [email or web address] Password [email or web address] 08/01/2017, 10:40 AM

## 2017-08-01 NOTE — Progress Notes (Signed)
Upon arrival to patient room to give scheduled AM treatment, noted that patient sats were reading 86% with a good waveform.  Gave treatment to patient, through oxygen, and sats improved to 97%.  Once treatment was finished, placed patient on 4L nasal cannula with sats of 94%.  Will continue to monitor.

## 2017-08-01 NOTE — Progress Notes (Addendum)
ANTICOAGULATION CONSULT NOTE  Pharmacy Consult for Warfarin / Lovenox Indication: recurrent DVT   Allergies  Allergen Reactions  . Levofloxacin Other (See Comments)    unknown     Labs:  Recent Labs  07/30/17 1915 07/30/17 1920 07/31/17 0420 08/01/17 0654  HGB 13.7  --  10.4* 11.4*  HCT 41.7  --  32.8* 37.7  PLT 234  --  141* 220  APTT  --   --  49*  --   LABPROT  --  22.0* 30.4* 17.4*  INR  --  1.94 2.94 1.44  CREATININE 1.66*  --  1.48* 1.52*    Estimated Creatinine Clearance: 29.8 mL/min (A) (by C-G formula based on SCr of 1.52 mg/dL (H)).   Assessment: 82 yo female admitted with hypoxia. 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   Admission INR 1.94. Per [facility] from [facility], patient was receiving warfarin and xarelto concomitantly. Per MD notes, patient was supposed to have switched from Xarelto to Coumadin with Lovenox bridge which occurred on previous admission.   She was recently inpatient at [facility] and was just discharged on 10/2. Pharmacy had been dosing Coumadin with Lovenox bridge.   Last dose of warfarin and Xarelto PTA today was on 10/3 PM. Lovenox was not charted as administered per facility [facility]. Last dose of lovenox administered on previous admission on 10/2 @ 1250pm.   CBC is stable and no s/s bleeding noted.   INR today has trended down to 1.44  Goal of Therapy:  INR 2-3 Monitor platelets by anticoagulation protocol: Yes   Plan:  Xarelto discontinued from PTA list per MD  Warfarin 4 mg po x 1 tonight Add Lovenox bridge - 60 mg sq Q 24 hours Daily INR and CBC Monitor for s/s bleeding  Thank you Okey Regal, PharmD [phone] 08/01/17 10:30 AM

## 2017-08-02 LAB — CBC
HCT: 34.3 % — ABNORMAL LOW (ref 36.0–46.0)
Hemoglobin: 10.6 g/dL — ABNORMAL LOW (ref 12.0–15.0)
MCH: 29.2 pg (ref 26.0–34.0)
MCHC: 30.9 g/dL (ref 30.0–36.0)
MCV: 94.5 fL (ref 78.0–100.0)
Platelets: 161 10*3/uL (ref 150–400)
RBC: 3.63 MIL/uL — ABNORMAL LOW (ref 3.87–5.11)
RDW: 12.4 % (ref 11.5–15.5)
WBC: 7.6 10*3/uL (ref 4.0–10.5)

## 2017-08-02 LAB — GLUCOSE, CAPILLARY
GLUCOSE-CAPILLARY: 97 mg/dL (ref 65–99)
GLUCOSE-CAPILLARY: 117 mg/dL — AB (ref 65–99)
Glucose-Capillary: 111 mg/dL — ABNORMAL HIGH (ref 65–99)
Glucose-Capillary: 189 mg/dL — ABNORMAL HIGH (ref 65–99)
Glucose-Capillary: 199 mg/dL — ABNORMAL HIGH (ref 65–99)

## 2017-08-02 LAB — PROTIME-INR
INR: 1.55
PROTHROMBIN TIME: 18.5 s — AB (ref 11.4–15.2)

## 2017-08-02 MED ORDER — AMOXICILLIN-POT CLAVULANATE 500-125 MG PO TABS
500.0000 mg | ORAL_TABLET | Freq: Two times a day (BID) | ORAL | Status: DC
  Administered 2017-08-02 – 2017-08-03 (×4): 500 mg via ORAL
  Filled 2017-08-02 (×5): qty 1

## 2017-08-02 MED ORDER — AMOXICILLIN-POT CLAVULANATE 875-125 MG PO TABS: 1.0000 | ORAL_TABLET | Freq: Two times a day (BID) | ORAL | Status: DC

## 2017-08-02 MED ORDER — WARFARIN SODIUM 4 MG PO TABS
4.0000 mg | ORAL_TABLET | Freq: Once | ORAL | Status: AC
  Administered 2017-08-02: 4 mg via ORAL
  Filled 2017-08-02: qty 1

## 2017-08-02 NOTE — Progress Notes (Signed)
RT placed pt on CPAP with bipap settings of I 12 and E 6. With 4lpm bled through pt tolerating well. Sat stable. RT to monitor as needed.

## 2017-08-02 NOTE — Progress Notes (Signed)
ANTICOAGULATION CONSULT NOTE  Pharmacy Consult for Warfarin / Lovenox Indication: recurrent DVT   Allergies  Allergen Reactions  . Levofloxacin Other (See Comments)    unknown     Labs:  Recent Labs  07/30/17 1915  07/31/17 0420 08/01/17 0654 08/02/17 0456 08/02/17 0658  HGB 13.7  --  10.4* 11.4* 10.6*  --   HCT 41.7  --  32.8* 37.7 34.3*  --   PLT 234  --  141* 220 161  --   APTT  --   --  49*  --   --   --   LABPROT  --   < > 30.4* 17.4*  --  18.5*  INR  --   < > 2.94 1.44  --  1.55  CREATININE 1.66*  --  1.48* 1.52*  --   --   < > = values in this interval not displayed.  Estimated Creatinine Clearance: 29.8 mL/min (A) (by C-G formula based on SCr of 1.52 mg/dL (H)).   Assessment: 82 yo female admitted with hypoxia. 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   Admission INR 1.94. Per [facility] from [facility], patient was receiving warfarin and xarelto concomitantly. Per MD notes, patient was supposed to have switched from Xarelto to Coumadin with Lovenox bridge which occurred on previous admission.   She was recently inpatient at [facility] and was just discharged on 10/2. Pharmacy had been dosing Coumadin with Lovenox bridge.   Last dose of warfarin and Xarelto PTA was on 10/3 PM. Lovenox was not charted as administered per facility MAR. Last dose of lovenox administered on previous admission on 10/2 @ 1250pm.   CBC low but stable with no bleeding noted at this time.  INR subtherapeutic today at 1.55 but trending up  Goal of Therapy:  INR 2-3 Monitor platelets by anticoagulation protocol: Yes   Plan:  Warfarin 4mg  PO x 1 Lovenox bridge 65mg  SubQ every 24 hours Daily INR and CBC Monitor for s/s bleeding  Daylene Posey, PharmD Pharmacy Resident Pager #: [phone] 08/02/2017 8:20 AM

## 2017-08-02 NOTE — Progress Notes (Signed)
PROGRESS NOTE    [name]  WUJ:[id number] DOB: 1935/02/22 DOA: 07/30/2017 PCP: Jac Canavan, PA-C   Brief Narrative: [name] is a 82 y.o. female with medical history significant ofdevelopmental disability, chronic anemia, history of recurrent DVT on chronic anticoagulation, asthma, diabetes, hypertension, dyslipidemia, HOCMand recent issues with recurrent falls. Patient presents with hypoxia secondary to HCAP and pleural effusion.   Assessment & Plan:   Active Problems:   Diabetes type 2, controlled (HCC)   Essential hypertension   Moderate persistent asthma   Chronic pain syndrome   Mild mental retardation   Gastroesophageal reflux disease without esophagitis   Chronic kidney disease   Decubitus ulcer of right buttock, stage 1   Hypertrophic obstructive cardiomyopathy (HOCM) (HCC)   Type 2 diabetes mellitus with stage 3 chronic kidney disease (HCC)   Closed traumatic displaced fracture of five ribs of right side   Falls frequently   Pleural effusion on right   Acute respiratory failure with hypoxia (HCC)   Sepsis due to pneumonia (HCC)   HCAP (healthcare-associated pneumonia)   Palliative care by specialist   Acute respiratory failure with hypoxia Secondary to HCAP and pleural effusion. Patient is DNR and family not wanting invasive management. PCCM consulted last night and are holding off of thoracentesis. -keep O2 >90% -palliative care consulted  HCAP Blood cultures negative. Oxygen weaned off. Improved overall. Blood cultures no growth to date. -dicontinue Vancomycin and cefepime -sputum culture not collected -start augmentin  Sepsis Secondary to HCAP. Present on admission. -blood cultures pending  Pleural effusion Possibly hemothorax. No thoracentesis at this time per PCCM and family discussion. -continue symptom management with oxygen therapy  CKD stage 3 Stable. -avoid nephrotoxic agents  Chronic pain syndrome -morphine  prn -palliative care recommendations  Multiple rib fractures -symptomatic management  Mild mental disability Chronic.  Diabetes mellitus -continue SSI  Tremor Seen by neurology at last admission. Plan to switch from Depakote to Lamictal. Improves when patient is asleep. -transition off of Depakote (  for 10 days starting 10/6) -continue Lamictal  History of DVT History of CVA -continue coumadin with Lovenox bridge  GERD -continue protonix  HOCM -continue metoprolol  Asthma -continue Duoneb prn -continue Pulmicort   DVT prophylaxis: Coumadin and Lovenox Code Status: DNR Family Communication: Attempted to call family member and was told "she's not here" and phone hung up Disposition Plan: Discharge to SNF   Consultants:   Palliative care medicine  Procedures:   Bipap  Antimicrobials:  Vancomycin  Cefepime  Augmentin (10/6>>   Subjective: No complaints today.  Objective: Vitals:   08/02/17 0410 08/02/17 0727 08/02/17 1027 08/02/17 1051  BP: 136/74 138/80 (!) 150/72 (!) 150/72  Pulse: 75 87 92 92  Resp: 16 18    Temp: 97.8 F (36.6 C) 98.1 F (36.7 C)    TempSrc: Oral Oral    SpO2: 97% 94%    Weight: 68.1 kg (150 lb 3.2 oz)     Height:        Intake/Output Summary (Last 24 hours) at 08/02/17 1225 Last data filed at 08/02/17 0500  Gross per 24 hour  Intake              200 ml  Output              450 ml  Net             -250 ml   Filed Weights   07/30/17 1915 08/01/17 0528 08/02/17 0410  Weight: 60.8  kg (134 lb) 65.8 kg (145 lb) 68.1 kg (150 lb 3.2 oz)    Examination:  General exam: Appears calm and comfortable Respiratory system: Diminished bilaterally with scattered rhonchi. Respiratory effort normal. Cardiovascular system: S1 & S2 heard, RRR. No murmurs, rubs, gallops or clicks. Gastrointestinal system: Soft, non-tender, non-distended, no guarding, no rebound, no masses felt Central nervous system: Arouses to voice. Not  answering questions appropriately. Flapping tremor of UE Extremities: No edema. No calf tenderness    Data Reviewed: I have personally reviewed following labs and imaging studies  CBC:  Recent Labs Lab 07/29/17 0456 07/30/17 1915 07/31/17 0420 08/01/17 0654 08/02/17 0456  WBC 5.5 24.4* 11.4* 12.3* 7.6  NEUTROABS  --  21.5*  --   --   --   HGB 10.1* 13.7 10.4* 11.4* 10.6*  HCT 31.9* 41.7 32.8* 37.7 34.3*  MCV 94.7 94.8 94.0 96.2 94.5  PLT 131* 234 141* 220 161   Basic Metabolic Panel:  Recent Labs Lab 07/29/17 0456 07/30/17 1915 07/31/17 0420 08/01/17 0654  NA 140 140 140 140  K 4.2 4.9 4.2 4.8  CL 107 105 108 108  CO2 29 24 25 26   GLUCOSE 110* 193* 98 105*  BUN 25* 31* 35* 38*  CREATININE 1.48* 1.66* 1.48* 1.52*  CALCIUM 8.0* 8.2* 7.4* 8.3*  MG  --   --  2.0  --   PHOS  --   --  2.6  --    GFR: Estimated Creatinine Clearance: 29.8 mL/min (A) (by C-G formula based on SCr of 1.52 mg/dL (H)). Liver Function Tests:  Recent Labs Lab 07/30/17 1915 07/31/17 0420  AST 42* 30  ALT 22 18  ALKPHOS 95 70  BILITOT 0.6 0.3  PROT 7.1 5.3*  ALBUMIN 3.1* 2.4*   No results for input(s): LIPASE, AMYLASE in the last 168 hours. No results for input(s): AMMONIA in the last 168 hours. Coagulation Profile:  Recent Labs Lab 07/29/17 0456 07/30/17 1920 07/31/17 0420 08/01/17 0654 08/02/17 0658  INR 1.48 1.94 2.94 1.44 1.55   Cardiac Enzymes: No results for input(s): CKTOTAL, CKMB, CKMBINDEX, TROPONINI in the last 168 hours. BNP (last 3 results) No results for input(s): PROBNP in the last 8760 hours. HbA1C:  Recent Labs  07/30/17 2237  HGBA1C 6.0*   CBG:  Recent Labs Lab 08/01/17 2126 08/01/17 2354 08/02/17 0407 08/02/17 0740 08/02/17 1145  GLUCAP 153* 90 111* 97 117*   Lipid Profile: No results for input(s): CHOL, HDL, LDLCALC, TRIG, CHOLHDL, LDLDIRECT in the last 72 hours. Thyroid Function Tests:  Recent Labs  07/31/17 0421  TSH 1.350    Anemia Panel: No results for input(s): VITAMINB12, FOLATE, FERRITIN, TIBC, IRON, RETICCTPCT in the last 72 hours. Sepsis Labs:  Recent Labs Lab 07/30/17 1927 07/30/17 2237 07/31/17 0422  PROCALCITON  --  5.58  --   LATICACIDVEN 2.07* 1.9 0.9    Recent Results (from the past 240 hour(s))  Blood Culture (routine x 2)     Status: None (Preliminary result)   Collection Time: 07/30/17  7:00 PM  Result Value Ref Range Status   Specimen Description BLOOD RIGHT FOREARM  Final   Special Requests IN PEDIATRIC BOTTLE Blood Culture adequate volume  Final   Culture NO GROWTH 2 DAYS  Final   Report Status PENDING  Incomplete  Blood Culture (routine x 2)     Status: None (Preliminary result)   Collection Time: 07/30/17  7:09 PM  Result Value Ref Range Status   Specimen Description BLOOD  RIGHT ANTECUBITAL  Final   Special Requests   Final    BOTTLES DRAWN AEROBIC AND ANAEROBIC Blood Culture adequate volume   Culture NO GROWTH 2 DAYS  Final   Report Status PENDING  Incomplete  MRSA PCR Screening     Status: None   Collection Time: 07/31/17  9:46 PM  Result Value Ref Range Status   MRSA by PCR NEGATIVE NEGATIVE Final    Comment:        The GeneXpert MRSA Assay (FDA approved for NASAL specimens only), is one component of a comprehensive MRSA colonization surveillance program. It is not intended to diagnose MRSA infection nor to guide or monitor treatment for MRSA infections.          Radiology Studies: No results found.      Scheduled Meds: . atorvastatin  40 mg Oral q1800  . budesonide  0.5 mg Nebulization BID  . buPROPion  150 mg Oral BID  . divalproex  250 mg Oral Daily  . enoxaparin (LOVENOX) injection  65 mg Subcutaneous Q24H  . FLUoxetine  40 mg Oral Daily  . insulin aspart  0-9 Units Subcutaneous Q4H  . lamoTRIgine  25 mg Oral Daily  . metoCLOPramide  2.5 mg Oral Q breakfast  . metoprolol tartrate  12.5 mg Oral BID  . sodium chloride flush  3 mL Intravenous  Q12H  . warfarin  4 mg Oral ONCE-1800  . Warfarin - Pharmacist Dosing Inpatient   Does not apply q1800   Continuous Infusions: . ceFEPime (MAXIPIME) IV Stopped (08/01/17 1906)     LOS: 3 days     Jacquelin Hawking, MD [facility] 08/02/2017, 12:25 PM Pager: [phone]  If 7PM-7AM, please contact night-coverage [email or web address] Password [email or web address] 08/02/2017, 12:25 PM

## 2017-08-02 NOTE — Progress Notes (Signed)
Pharmacy Antibiotic Note  [name] is a 82 y.o. female admitted on 07/30/2017 with pneumonia/sepsis.  Pharmacy has been consulted for vancomycin/cefepime dosing. Afebrile, leukocytosis resolved and renal function unchanged with CKD3.    Plan: Cefepime 1g IV q24h Vancomycin 750mg  IV q24h Monitor clinical progress, c/s, renal function F/u de-escalation plan/LOT, vancomycin trough as indicated Consider d/c vancomycin    Height: 5\' 8"  (172.7 cm) Weight: 150 lb 3.2 oz (68.1 kg) IBW/kg (Calculated) : 63.9  Temp (24hrs), Avg:98.1 F (36.7 C), Min:97.5 F (36.4 C), Max:99.1 F (37.3 C)   Recent Labs Lab 07/29/17 0456 07/30/17 1915 07/30/17 1927 07/30/17 2237 07/31/17 0420 07/31/17 0422 08/01/17 0654 08/02/17 0456  WBC 5.5 24.4*  --   --  11.4*  --  12.3* 7.6  CREATININE 1.48* 1.66*  --   --  1.48*  --  1.52*  --   LATICACIDVEN  --   --  2.07* 1.9  --  0.9  --   --     Estimated Creatinine Clearance: 29.8 mL/min (A) (by C-G formula based on SCr of 1.52 mg/dL (H)).    Allergies  Allergen Reactions  . Levofloxacin Other (See Comments)    unknown    Antimicrobials this admission: 10/3 vancomycin >>  10/3 cefepime >>   Dose adjustments this admission: n/a  Microbiology results: Flu negative Strep pneum negative MRSA PCR negative 10/3 BC x 2: NGTD Sputum - pend   Daylene Posey, PharmD Pharmacy Resident Pager #: [phone] 08/02/2017 8:12 AM

## 2017-08-03 LAB — CBC
HCT: 32.6 % — ABNORMAL LOW (ref 36.0–46.0)
HEMOGLOBIN: 10.6 g/dL — AB (ref 12.0–15.0)
MCH: 30.5 pg (ref 26.0–34.0)
MCHC: 32.5 g/dL (ref 30.0–36.0)
MCV: 93.7 fL (ref 78.0–100.0)
Platelets: 186 10*3/uL (ref 150–400)
RBC: 3.48 MIL/uL — ABNORMAL LOW (ref 3.87–5.11)
RDW: 12.3 % (ref 11.5–15.5)
WBC: 9.4 10*3/uL (ref 4.0–10.5)

## 2017-08-03 LAB — GLUCOSE, CAPILLARY
GLUCOSE-CAPILLARY: 104 mg/dL — AB (ref 65–99)
Glucose-Capillary: 126 mg/dL — ABNORMAL HIGH (ref 65–99)
Glucose-Capillary: 127 mg/dL — ABNORMAL HIGH (ref 65–99)
Glucose-Capillary: 138 mg/dL — ABNORMAL HIGH (ref 65–99)
Glucose-Capillary: 139 mg/dL — ABNORMAL HIGH (ref 65–99)
Glucose-Capillary: 224 mg/dL — ABNORMAL HIGH (ref 65–99)

## 2017-08-03 LAB — PROTIME-INR
INR: 1.77
PROTHROMBIN TIME: 20.5 s — AB (ref 11.4–15.2)

## 2017-08-03 MED ORDER — HYDROCODONE-ACETAMINOPHEN 5-325 MG PO TABS: 1.0000 | ORAL_TABLET | Freq: Four times a day (QID) | ORAL | 0 refills | Status: AC | PRN

## 2017-08-03 MED ORDER — LORAZEPAM 0.5 MG PO TABS: 0.5000 mg | ORAL_TABLET | Freq: Three times a day (TID) | ORAL | 0 refills | Status: AC

## 2017-08-03 MED ORDER — AMOXICILLIN-POT CLAVULANATE 500-125 MG PO TABS: 500.0000 mg | ORAL_TABLET | Freq: Two times a day (BID) | ORAL | Status: AC

## 2017-08-03 MED ORDER — POLYETHYLENE GLYCOL 3350 17 G PO PACK: 17.0000 g | PACK | Freq: Every day | ORAL | Status: AC | PRN

## 2017-08-03 MED ORDER — WARFARIN SODIUM 4 MG PO TABS
4.0000 mg | ORAL_TABLET | Freq: Once | ORAL | Status: AC
  Administered 2017-08-03: 4 mg via ORAL
  Filled 2017-08-03: qty 1

## 2017-08-03 MED ORDER — DIVALPROEX SODIUM ER 250 MG PO TB24: 250.0000 mg | ORAL_TABLET | Freq: Every day | ORAL | Status: AC

## 2017-08-03 NOTE — Clinical Social Work Note (Signed)
Clinical Social Work Assessment  Patient Details  Name: [name] MRN: [id number] Date of Birth: 11/20/1934  Date of referral:  08/03/17               Reason for consult:  Facility Placement                Permission sought to share information with:  Family Supports Permission granted to share information::  Yes, Verbal Permission Granted  Name::     Trevor Mace  Agency::  [facility]  Relationship::  Sister in Diplomatic Services operational officer Information:  [phone]  Housing/Transportation Living arrangements for the past 2 months:  Skilled Building surveyor of Information:  Power of Attorney Patient Interpreter Needed:  None Criminal Activity/Legal Involvement Pertinent to Current Situation/Hospitalization:  No - Comment as needed Significant Relationships:  Other Family Members, Siblings Lives with:  Facility Resident Do you feel safe going back to the place where you live?  Yes Need for family participation in patient care:  Yes (Comment)  Care giving concerns:  Patient sister in law [facility]), expressed concern regarding patient discharge date.  CSW reiterated that the MD would determine when patient is medically stable and CSW would facilitate discharge.   Social Worker assessment / plan:  Visual merchandiser spoke with patient sister in law over the phone to offer support and discuss patient needs at discharge.  Patient sister in law states that patient is a resident at [facility] and would like her to return at discharge.  CSW left a message for admissions coordinator at facility to confirm patient return.  CSW remains available for support and to facilitate patient discharge needs once medically stable.  Employment status:  Retired Health and safety inspector:  Medicare PT Recommendations:  Skilled Nursing Facility Information / Referral to community resources:  Skilled Nursing Facility  Patient/Family's Response to care:  Patient sister in law verbalized understanding of CSW role  and appreciation for support and concern.  Patient sister in law is hopeful for patient return to [facility].  Patient/Family's Understanding of and Emotional Response to Diagnosis, Current Treatment, and Prognosis:  Patient family understanding of patient limitations and need for SNF placement.    Emotional Assessment Appearance:  Appears stated age Attitude/Demeanor/Rapport:  Other Affect (typically observed):  Pleasant Orientation:  Oriented to Self, Oriented to Place Alcohol / Substance use:  Not Applicable Psych involvement (Current and /or in the community):  No (Comment)  Discharge Needs  Concerns to be addressed:  No discharge needs identified Readmission within the last 30 days:  Yes Current discharge risk:  None Barriers to Discharge:  Continued Medical Work up  [employer], [place] [phone]

## 2017-08-03 NOTE — Discharge Summary (Addendum)
Physician Discharge Summary  [name] ZOX:[id number] DOB: 1935/10/08 DOA: 07/30/2017  PCP: Jac Canavan, PA-C  Admit date: 07/30/2017 Discharge date: 08/04/2017  Admitted From: SNF Disposition: SNF  Recommendations for Outpatient Follow-up:  1. Follow up with PCP in 1 week 2. Follow up with neurology; plan from previous admission was to increase lamotrigine after Depakote weaned off 3. Please obtain BMP/CBC in one week 4. Hospice referral 5. Please follow up on the following pending results: None  Home Health: SNF Equipment/Devices: SNF  Discharge Condition: Stable. CODE STATUS: DNR Diet recommendation: Soft diet, whole meds with puree, assist with feeding secondary to tremors   Brief/Interim Summary:  Admission HPI written by Therisa Doyne, MD   Chief Complaint: hypoxia  HPI: [name] is a 82 y.o. female with medical history significant ofdevelopmental disability, chronic anemia, history of recurrent DVT on chronic anticoagulation, asthma, diabetes, hypertension, dyslipidemia, HOCMand recent issues with recurrent falls    Presented with possible episode of hypoxia sister was notified today patient developed episode of hypoxia after doing physical therapy on arrival satting 83% on room air 99% nonrebreather initially afebrile temperature 99.9 but heart rate elevated 138.     Recently admitted to the significant fall and strokelike symptoms from group home stent started on 27th of September MR of brain showed punctate acute infarct of the right cerebellum. carotid Dopplers showed 1-39% ICA stenosis. GU 2 chronic kidney disease she was switched from Xarelto to  Coumadin bridging with Lovenox  2-D echo was done and showed preserved EF no cardiac source of emboli. Allergies recommended weaning off Depakote and replacing with lamotrigine. Patient was found to have multiple right rib fractures with pleural effusion in the right was treated with Lasix patient  was followed by trauma service and they did not recommend thoracentesis at the time of discharge. CT scan of the chest was done and showed moderately large simple right pleural effusion they felt it was not consistent with hemothorax and minimal pericardial effusion She was discharged to [facility] place yesterday 2nd of October 2018   Hospital course:  Acute respiratory failure with hypoxia Secondary to HCAP and pleural effusion. Patient is DNR and family not wanting invasive management. PCCM consulted last night and are holding off of thoracentesis. -keep O2 >90% -palliative care consulted  HCAP Blood cultures negative. Oxygen weaned off. Improved overall. Blood cultures no growth to date. Initially treated with Vancomycin and cefepime and transitioned to Augmentin prior to discharge. End date 08/06/2017  Sepsis Secondary to HCAP. Present on admission. Blood cultures no growth to date.  Pleural effusion Possibly hemothorax. No thoracentesis at this time per PCCM and family discussion. Symptom management  CKD stage 3 Stable.  Chronic pain syndrome Morphine as needed. Resume Norco on discharge  Multiple rib fractures Symptomatic management  Mild mental disability Chronic.  Diabetes mellitus Sliding scale while inpatient.  Tremor Seen by neurology at last admission. Weaning off of Depakote. Currently on Depakote 250mg  for 10 days (last dose on 10/15). Recommendation to increase Lamictal when Depakote weaned. Recommend follow-up with neurology as an outpatient.  History of DVT History of CVA Continue coumadin with Lovenox bridge. Goal INR of 2.  GERD Protonix  HOCM Continued metoprolol  Asthma Duoneb prn and Pulmicort   Discharge Diagnoses:  Active Problems:   Diabetes type 2, controlled (HCC)   Essential hypertension   Moderate persistent asthma   Chronic pain syndrome   Mild mental retardation   Gastroesophageal reflux disease without esophagitis  Chronic kidney disease   Hypertrophic obstructive cardiomyopathy (HOCM) (HCC)   Type 2 diabetes mellitus with stage 3 chronic kidney disease (HCC)   Closed traumatic displaced fracture of five ribs of right side   Falls frequently   Pleural effusion on right   Acute respiratory failure with hypoxia (HCC)   Sepsis due to pneumonia (HCC)   HCAP (healthcare-associated pneumonia)   Palliative care by specialist    Discharge Instructions  Discharge Instructions    Call MD for:  difficulty breathing, headache or visual disturbances    Complete by:  As directed    Call MD for:  extreme fatigue    Complete by:  As directed    Call MD for:  persistant dizziness or light-headedness    Complete by:  As directed    Call MD for:  persistant nausea and vomiting    Complete by:  As directed    Diet - low sodium heart healthy    Complete by:  As directed    Increase activity slowly    Complete by:  As directed      Allergies as of 08/03/2017      Reactions   Levofloxacin Other (See Comments)   unknown      Medication List    STOP taking these medications   acetaminophen 325 MG tablet Commonly known as:  TYLENOL     TAKE these medications   albuterol (2.5 MG/3ML) 0.083% nebulizer solution Commonly known as:  PROVENTIL Take 3 mLs (2.5 mg total) by nebulization every 6 (six) hours as needed for wheezing or shortness of breath (dx J47.9    I42.2).   amoxicillin-clavulanate 500-125 MG tablet Commonly known as:  AUGMENTIN Take 1 tablet (500 mg total) by mouth 2 (two) times daily. End date 08/06/2017   atorvastatin 40 MG tablet Commonly known as:  LIPITOR Take 1 tablet (40 mg total) by mouth daily at 6 PM.   budesonide 0.5 MG/2ML nebulizer solution Commonly known as:  PULMICORT TAKE 2 ml (0.5mg ) BY NEBULIZER TWICE DAILY   buPROPion 150 MG 12 hr tablet Commonly known as:  WELLBUTRIN SR Take 150 mg by mouth 2 (two) times daily.   CALCIUM 600-D 600-400 MG-UNIT Tabs Generic drug:   Calcium Carbonate-Vitamin D3 Take 2 tablets by mouth every evening.   divalproex 250 MG 24 hr tablet Commonly known as:  DEPAKOTE ER Take 1 tablet (250 mg total) by mouth daily. Last day 08/11/17 What changed:  medication strength  how much to take  additional instructions   docusate sodium 100 MG capsule Commonly known as:  COLACE Take 1 capsule (100 mg total) by mouth 2 (two) times daily. Prn   enoxaparin 60 MG/0.6ML injection Commonly known as:  LOVENOX Inject 0.6 mLs (60 mg total) into the skin daily. Until INR above 2, bridge with Coumadin   FLUoxetine 40 MG capsule Commonly known as:  PROZAC Take 40 mg by mouth daily.   fluticasone 50 MCG/ACT nasal spray Commonly known as:  FLONASE Place 2 sprays into both nostrils daily.   furosemide 20 MG tablet Commonly known as:  LASIX Take 1 tablet (20 mg total) by mouth daily.   glucose blood test strip Commonly known as:  ACCU-CHEK AVIVA PLUS CHECK BLOOD GLUCOSE (SUGAR) 1 OR 2 TIMESA WEEK   HYDROcodone-acetaminophen 5-325 MG tablet Commonly known as:  NORCO Take 1 tablet by mouth every 6 (six) hours as needed for moderate pain. Pt receives at 8am and 9pm   hydroxypropyl methylcellulose /  hypromellose 2.5 % ophthalmic solution Commonly known as:  ISOPTO TEARS / GONIOVISC Place 1 drop into both eyes 4 (four) times daily.   isosorbide mononitrate 30 MG 24 hr tablet Commonly known as:  IMDUR Take 1 tablet (30 mg total) by mouth daily.   lamoTRIgine 25 MG tablet Commonly known as:  LAMICTAL Take 1 tablet (25 mg total) by mouth daily.   loratadine 10 MG tablet Commonly known as:  QC LORATADINE ALLERGY RELIEF Take 1 tablet (10 mg total) by mouth daily.   LORazepam 0.5 MG tablet Commonly known as:  ATIVAN Take 1 tablet (0.5 mg total) by mouth 3 (three) times daily. And as needed What changed:  when to take this  reasons to take this  additional instructions   metoCLOPramide 5 MG tablet Commonly known as:   REGLAN Take 0.5 tablets (2.5 mg total) by mouth daily.   metoprolol tartrate 25 MG tablet Commonly known as:  LOPRESSOR Take 0.5 tablets (12.5 mg total) by mouth 2 (two) times daily.   montelukast 10 MG tablet Commonly known as:  SINGULAIR Take 1 tablet (10 mg total) by mouth at bedtime.   multivitamin tablet Take 1 tablet by mouth daily.   omeprazole 20 MG capsule Commonly known as:  PRILOSEC Take 1 capsule (20 mg total) by mouth daily.   polyethylene glycol packet Commonly known as:  MIRALAX / GLYCOLAX Take 17 g by mouth daily as needed for mild constipation.   PRODIGY TWIST TOP LANCETS 28G Misc TWICE weekly   PROLIA 60 MG/ML Soln injection Generic drug:  denosumab Inject 60 mg as directed every 6 (six) months.   warfarin 2 MG tablet Commonly known as:  COUMADIN Take 2 mg by mouth daily.      Follow-up Information    Tysinger, Kermit Balo, PA-C. Schedule an appointment as soon as possible for a visit in 1 week(s).   Specialty:  Family Medicine Contact information: [address] [place] [address] [phone]        [facility]. Schedule an appointment as soon as possible for a visit in 2 week(s).   Specialty:  Neurology Why:  Tremor Contact information: [address] [place] [address] [phone]         Allergies  Allergen Reactions  . Levofloxacin Other (See Comments)    unknown    Consultations:  Palliative care medicine   Procedures/Studies: Dg Chest 2 View  Result Date: 07/25/2017 CLINICAL DATA:  Weakness.  Ex-smoker. EXAM: CHEST  2 VIEW COMPARISON:  05/09/2017. FINDINGS: Interval significantly displaced fractures of the right third, fourth, fifth, sixth and seventh ribs posteriorly. Moderate-sized right pleural effusion and minimal left pleural effusion. Mild right basilar atelectasis and minimal left basilar atelectasis. Diffuse peribronchial thickening without significant change. No  pneumothorax. Thoracic spine degenerative changes. IMPRESSION: 1. Interval displaced right third through seventh rib fractures. 2. Moderate-sized right pleural effusion and minimal left pleural effusion. 3. Mild right basilar atelectasis and minimal left basilar atelectasis. 4. Stable chronic bronchitic changes. Electronically Signed   By: Beckie Salts M.D.   On: 07/25/2017 10:12   Ct Head Wo Contrast  Result Date: 07/25/2017 CLINICAL DATA:  Difficulty with ambulation.  Tremors. EXAM: CT HEAD WITHOUT CONTRAST TECHNIQUE: Contiguous axial images were obtained from the base of the skull through the vertex without intravenous contrast. COMPARISON:  May 20, 2016 FINDINGS: Brain: Mild diffuse atrophy is stable. There is no intracranial mass, hemorrhage, extra-axial fluid collection, or midline shift. There is patchy  small vessel disease in the centra semiovale bilaterally. Elsewhere gray-white compartments are normal. No evident acute infarct. Vascular: No hyperdense vessel evident. There is calcification in each cavernous carotid artery region. Skull: Bony calvarium appears intact. Sinuses/Orbits: There is mucosal thickening in several ethmoid air cells. Other visualized paranasal sinuses are clear. Orbits appear symmetric bilaterally. Patient has had cataract removal bilaterally. Other: Postoperative change noted in the left mastoid region. Aerated mastoid air cells bilaterally are clear. There is debris in each external auditory canal. IMPRESSION: 1. Mild atrophy with patchy periventricular small vessel disease, stable. No intracranial mass, hemorrhage, or extra-axial fluid collection. No evident acute infarct. 2.  Mild arterial vascular calcification noted. 3. Postoperative change left mastoid region. Aerated mastoid air cells are clear. 4.  Mucosal thickening in several ethmoid air cells. 5.  Probable cerumen in each external auditory canal. Electronically Signed   By: Bretta Bang III M.D.   On: 07/25/2017  10:09   Ct Chest Wo Contrast  Result Date: 07/25/2017 CLINICAL DATA:  Multiple right rib fractures falling a fall with a moderate-sized right pleural effusion. The patient is anticoagulated. Clinical concern for pneumothorax. EXAM: CT CHEST WITHOUT CONTRAST TECHNIQUE: Multidetector CT imaging of the chest was performed following the standard protocol without IV contrast. COMPARISON:  Chest radiographs obtained earlier today. FINDINGS: Cardiovascular: Atheromatous calcifications, including the coronary arteries and aorta. Normal sized heart. Minimal pericardial effusion with a maximum thickness of 4 mm. Mediastinum/Nodes: The right lobe of the thyroid gland is enlarged and extending posteriorly to the level of the spine. There is also a 9.5 mm nodule in the right lobe of the thyroid gland as well is a small or nodule. No enlarged lymph nodes. Lungs/Pleura: Moderately large right pleural effusion measuring 10 Hounsfield units in density. Minimal left pleural fluid. Compressive atelectasis of the right lower lobe. There is also dependent atelectasis in the left lower lobe and inferior aspect of the lingula. There is also mild dependent atelectasis in the right upper lobe. Small amount of patchy opacity in the medial aspect of the left upper lobe, anteriorly. No pneumothorax. Upper Abdomen: Abdominal aortic calcifications. Musculoskeletal: Healing fracture of the distal right scapula, best demonstrated on the coronal images. Displaced right posterior third, fourth, fifth, sixth, seventh, eighth, ninth, tenth, eleventh rib fractures with partial bridging callus formation. There are also right third, fourth, fifth, sixth, seventh posterolateral rib fractures with partially bridging callus. There are additional old, healed bilateral rib fractures. No vertebral fractures or subluxations are seen. Thoracic spine degenerative changes are noted. IMPRESSION: 1. Subacute right third through eleventh rib fractures with  partial bridging callus formation. 2. Additional old, healed bilateral rib fractures. 3. No acute fractures seen today. 4. Subacute, healing distal right scapula fracture. 5. Moderately large, simple right pleural effusion. This does not have features of an acute hemothorax. 6. Minimal left pleural effusion. 7. Bilateral atelectasis. 8. Small amount of patchy atelectasis or pneumonia in the anteromedial aspect of the left upper lobe. 9. 9.5 mm and smaller right lobe thyroid nodules. These are too small to characterize, but most likely benign in the absence of known clinical risk factors for thyroid carcinoma. 10.  Calcific coronary artery and aortic atherosclerosis. 11. Minimal pericardial effusion. Aortic Atherosclerosis (ICD10-I70.0). Electronically Signed   By: Beckie Salts M.D.   On: 07/25/2017 17:32   Mr Brain Wo Contrast  Result Date: 07/25/2017 CLINICAL DATA:  Ataxia.  Difficulty walking. EXAM: MRI HEAD WITHOUT CONTRAST TECHNIQUE: Multiplanar, multiecho pulse sequences of the brain and  surrounding structures were obtained without intravenous contrast. COMPARISON:  Head CT 07/25/2017 FINDINGS: The examination had to be discontinued prior to completion due to claustrophobia despite receiving medication. Axial coronal diffusion, axial T2, and axial FLAIR sequences were obtained and are mildly motion degraded. Brain: There is a 2 mm focus of hyperintense signal in the superior right cerebellum on the axial diffusion sequence with corresponding reduced ADC, not confirmed on the coronal diffusion sequences though evaluation is limited by its small size and slice selection. There is no evidence of acute infarct elsewhere. No intracranial hemorrhage, mass, midline shift, or extra-axial fluid collection is identified. There is mild generalized cerebral atrophy for age. Periventricular white matter T2 hyperintensity is nonspecific but compatible with minimal chronic small vessel ischemic disease, not greater than  expected for age. Vascular: Major intracranial vascular flow voids are preserved. Skull and upper cervical spine: No gross osseous lesion. Sinuses/Orbits: Bilateral cataract extraction. Paranasal sinuses and mastoid air cells are clear. Trace fluid in the new nasopharynx. Other: None. IMPRESSION: 1. Motion degraded, incomplete examination. 2. Suspected punctate acute infarct in the right cerebellum. Electronically Signed   By: Sebastian Ache M.D.   On: 07/25/2017 15:22   Dg Chest Right Decubitus  Result Date: 07/29/2017 CLINICAL DATA:  Pleural effusion. EXAM: CHEST - RIGHT DECUBITUS COMPARISON:  Chest x-ray from same day. FINDINGS: Right decubitus film demonstrates large layering right-sided pleural effusion. Multiple right-sided rib fractures are again noted. Unchanged scarring at the left costophrenic angle. IMPRESSION: Large layering right-sided pleural effusion. Electronically Signed   By: Obie Dredge M.D.   On: 07/29/2017 08:49   Dg Chest Port 1 View  Result Date: 07/31/2017 CLINICAL DATA:  Pleural effusion. EXAM: PORTABLE CHEST 1 VIEW COMPARISON:  07/30/2017 FINDINGS: Layering right pleural effusion. Numerous right rib fractures showing various stages of healing on this study and on recent chest CT. There is a small left pleural effusion. No visible pneumothorax. Stable lung inflation. Normal heart size when accounting for distortion by leftward rotation. IMPRESSION: 1. Layering moderate right and small left pleural effusions. Stable lung inflation, with extensive atelectasis seen on recent chest CT. 2. Numerous right rib fractures that are healing by CT. Electronically Signed   By: Marnee Spring M.D.   On: 07/31/2017 09:11   Dg Chest Port 1 View  Result Date: 07/30/2017 CLINICAL DATA:  Initial evaluation for acute fever with hypoxia. EXAM: PORTABLE CHEST 1 VIEW COMPARISON:  Prior radiograph from 07/29/2017. FINDINGS: Stable cardiomegaly. Mediastinal silhouette unchanged. Aortic  atherosclerosis. Persistent moderate right pleural effusion. Scattered airspace opacities present throughout the right lung, concerning for possible infiltrate given history of fever. Dense consolidation seen just superior to the right minor fissure. Probable superimposed component of atelectasis at the right lung base. Small left pleural effusion versus chronic pleural reaction. Left lung otherwise clear. Multiple right-sided rib fractures again noted. IMPRESSION: 1. Persistent moderate right pleural effusion. 2. Associated parenchymal opacity and congestion throughout the right lung, concerning for infiltrate given history of fever. 3. Blunting of the left costophrenic angle, which may reflect small effusion versus chronic pleural reaction. Electronically Signed   By: Rise Mu M.D.   On: 07/30/2017 20:10   Dg Chest Port 1 View  Result Date: 07/29/2017 CLINICAL DATA:  Right pleural effusion EXAM: PORTABLE CHEST 1 VIEW COMPARISON:  Chest x-ray of July 28, 2017 FINDINGS: The left lung is well-expanded. There is stable parenchymal density in the left lateral costophrenic angle. On the right there is considerable parenchymal opacification volume the mid  and lower hemithorax. The heart is not clearly enlarged but the right heart border is obscured. The pulmonary vascularity is not engorged. Multiple healing right-sided rib fractures are present. There are also acute rib fractures in the upper right hemithorax. IMPRESSION: Stable appearance of the chest. Large right pleural effusion. Right basilar atelectasis or pneumonia or contusion. Multiple right-sided rib fractures. Minimal left basilar atelectasis or scarring.  No pulmonary edema. Electronically Signed   By: David  [place] M.D.   On: 07/29/2017 07:38   Dg Chest Port 1 View  Result Date: 07/28/2017 CLINICAL DATA:  82 year old female with right-sided rib fractures. Subsequent encounter. EXAM: PORTABLE CHEST 1 VIEW COMPARISON:  07/25/2017 CT and  chest x-ray. FINDINGS: Multiple right-sided rib fractures with large layering right-sided pleural effusion which is the same or slightly larger than on the prior exam. Prominent skin folds without gross pneumothorax noted. Limited evaluation of the mediastinal and cardiac silhouette. IMPRESSION: Multiple right-sided rib fractures with large layering right-sided pleural effusion which is the same or slightly larger than on the prior exam. Prominent skin folds without gross pneumothorax noted. Electronically Signed   By: Lacy Duverney M.D.   On: 07/28/2017 10:40     Subjective: No complaints  Discharge Exam: Vitals:   08/03/17 0900 08/03/17 1211  BP: (!) 166/99 (!) 141/76  Pulse: 96   Resp:    Temp:  98.3 F (36.8 C)  SpO2:     Vitals:   08/03/17 0033 08/03/17 0441 08/03/17 0900 08/03/17 1211  BP: 129/90 90/80 (!) 166/99 (!) 141/76  Pulse: (!) 102 92 96   Resp: 18 18    Temp: 100.1 F (37.8 C) 97.8 F (36.6 C)  98.3 F (36.8 C)  TempSrc: Oral Oral  Oral  SpO2: 93% 95%    Weight:      Height:        General exam: Appears calm and comfortable Respiratory system: Diminished bilaterally. Respiratory effort normal. Cardiovascular system: S1 & S2 heard, RRR. No murmurs. Gastrointestinal system: Soft, non-tender, non-distended, no guarding, no rebound, no masses felt Central nervous system: Alert. Oriented to person and place. Flapping tremor. Extremities: No edema. No calf tenderness   The results of significant diagnostics from this hospitalization (including imaging, microbiology, ancillary and laboratory) are listed below for reference.     Microbiology: Recent Results (from the past 240 hour(s))  Blood Culture (routine x 2)     Status: None (Preliminary result)   Collection Time: 07/30/17  7:00 PM  Result Value Ref Range Status   Specimen Description BLOOD RIGHT FOREARM  Final   Special Requests IN PEDIATRIC BOTTLE Blood Culture adequate volume  Final   Culture NO GROWTH  3 DAYS  Final   Report Status PENDING  Incomplete  Blood Culture (routine x 2)     Status: None (Preliminary result)   Collection Time: 07/30/17  7:09 PM  Result Value Ref Range Status   Specimen Description BLOOD RIGHT ANTECUBITAL  Final   Special Requests   Final    BOTTLES DRAWN AEROBIC AND ANAEROBIC Blood Culture adequate volume   Culture NO GROWTH 3 DAYS  Final   Report Status PENDING  Incomplete  MRSA PCR Screening     Status: None   Collection Time: 07/31/17  9:46 PM  Result Value Ref Range Status   MRSA by PCR NEGATIVE NEGATIVE Final    Comment:        The GeneXpert MRSA Assay (FDA approved for NASAL specimens only), is one component of  a comprehensive MRSA colonization surveillance program. It is not intended to diagnose MRSA infection nor to guide or monitor treatment for MRSA infections.      Labs: BNP (last 3 results) No results for input(s): BNP in the last 8760 hours. Basic Metabolic Panel:  Recent Labs Lab 07/29/17 0456 07/30/17 1915 07/31/17 0420 08/01/17 0654  NA 140 140 140 140  K 4.2 4.9 4.2 4.8  CL 107 105 108 108  CO2 GLUCOSE 110* 193* 98 105*  BUN 25* 31* 35* 38*  CREATININE 1.48* 1.66* 1.48* 1.52*  CALCIUM 8.0* 8.2* 7.4* 8.3*  MG  --   --  2.0  --   PHOS  --   --  2.6  --    Liver Function Tests:  Recent Labs Lab 07/30/17 1915 07/31/17 0420  AST 42* 30  ALT 22 18  ALKPHOS 95 70  BILITOT 0.6 0.3  PROT 7.1 5.3*  ALBUMIN 3.1* 2.4*   No results for input(s): LIPASE, AMYLASE in the last 168 hours. No results for input(s): AMMONIA in the last 168 hours. CBC:  Recent Labs Lab 07/30/17 1915 07/31/17 0420 08/01/17 0654 08/02/17 0456 08/03/17 0509  WBC 24.4* 11.4* 12.3* 7.6 9.4  NEUTROABS 21.5*  --   --   --   --   HGB 13.7 10.4* 11.4* 10.6* 10.6*  HCT 41.7 32.8* 37.7 34.3* 32.6*  MCV 94.8 94.0 96.2 94.5 93.7  PLT 234 141* 220 161 186   Cardiac Enzymes: No results for input(s): CKTOTAL, CKMB, CKMBINDEX,  TROPONINI in the last 168 hours. BNP: Invalid input(s): POCBNP CBG:  Recent Labs Lab 08/02/17 2127 08/03/17 0032 08/03/17 0439 08/03/17 0824 08/03/17 1154  GLUCAP 199* 139* 126* 104* 138*   D-Dimer No results for input(s): DDIMER in the last 72 hours. Hgb A1c No results for input(s): HGBA1C in the last 72 hours. Lipid Profile No results for input(s): CHOL, HDL, LDLCALC, TRIG, CHOLHDL, LDLDIRECT in the last 72 hours. Thyroid function studies No results for input(s): TSH, T4TOTAL, T3FREE, THYROIDAB in the last 72 hours.  Invalid input(s): FREET3 Anemia work up No results for input(s): VITAMINB12, FOLATE, FERRITIN, TIBC, IRON, RETICCTPCT in the last 72 hours. Urinalysis    Component Value Date/Time   COLORURINE YELLOW 07/30/2017 2043   APPEARANCEUR CLEAR 07/30/2017 2043   LABSPEC 1.015 07/30/2017 2043   LABSPEC 1.015 07/01/2017 1018   PHURINE 5.0 07/30/2017 2043   GLUCOSEU NEGATIVE 07/30/2017 2043   HGBUR NEGATIVE 07/30/2017 2043   BILIRUBINUR NEGATIVE 07/30/2017 2043   BILIRUBINUR negative 07/01/2017 1018   BILIRUBINUR 1+ 12/02/2016 1204   KETONESUR NEGATIVE 07/30/2017 2043   PROTEINUR 30 (A) 07/30/2017 2043   UROBILINOGEN negative 12/02/2016 1204   NITRITE NEGATIVE 07/30/2017 2043   LEUKOCYTESUR SMALL (A) 07/30/2017 2043   Sepsis Labs Invalid input(s): PROCALCITONIN,  WBC,  LACTICIDVEN Microbiology Recent Results (from the past 240 hour(s))  Blood Culture (routine x 2)     Status: None (Preliminary result)   Collection Time: 07/30/17  7:00 PM  Result Value Ref Range Status   Specimen Description BLOOD RIGHT FOREARM  Final   Special Requests IN PEDIATRIC BOTTLE Blood Culture adequate volume  Final   Culture NO GROWTH 3 DAYS  Final   Report Status PENDING  Incomplete  Blood Culture (routine x 2)     Status: None (Preliminary result)   Collection Time: 07/30/17  7:09 PM  Result Value Ref Range Status   Specimen Description BLOOD RIGHT ANTECUBITAL  Final  Special Requests   Final    BOTTLES DRAWN AEROBIC AND ANAEROBIC Blood Culture adequate volume   Culture NO GROWTH 3 DAYS  Final   Report Status PENDING  Incomplete  MRSA PCR Screening     Status: None   Collection Time: 07/31/17  9:46 PM  Result Value Ref Range Status   MRSA by PCR NEGATIVE NEGATIVE Final    Comment:        The GeneXpert MRSA Assay (FDA approved for NASAL specimens only), is one component of a comprehensive MRSA colonization surveillance program. It is not intended to diagnose MRSA infection nor to guide or monitor treatment for MRSA infections.      Time coordinating discharge: Over 30 minutes  SIGNED:   Jacquelin Hawking, MD [facility] 08/03/2017, 1:41 PM Pager [phone]  If 7PM-7AM, please contact night-coverage [email or web address] Password [email or web address]

## 2017-08-03 NOTE — Progress Notes (Signed)
Patient refuses CPAP for the night. RT will continue to monitor.  

## 2017-08-03 NOTE — Clinical Social Work Note (Signed)
Clinical Social Worker received return call from [facility], who states no bed availability today, however hopeful for a bed tomorrow.  CSW provided RN with update and will remain available to facilitate patient discharge.  Macario Golds, [place] [phone]

## 2017-08-03 NOTE — Progress Notes (Signed)
ANTICOAGULATION CONSULT NOTE  Pharmacy Consult for Warfarin / Lovenox Indication: recurrent DVT   Allergies  Allergen Reactions  . Levofloxacin Other (See Comments)    unknown     Labs:  Recent Labs  08/01/17 0654 08/02/17 0456 08/02/17 0658 08/03/17 0509  HGB 11.4* 10.6*  --  10.6*  HCT 37.7 34.3*  --  32.6*  PLT 220 161  --  186  LABPROT 17.4*  --  18.5* 20.5*  INR 1.44  --  1.55 1.77  CREATININE 1.52*  --   --   --     Estimated Creatinine Clearance: 29.8 mL/min (A) (by C-G formula based on SCr of 1.52 mg/dL (H)).   Assessment: 82 yo female admitted with hypoxia. 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   Admission INR 1.94. Per [facility] from [facility], patient was receiving warfarin and xarelto concomitantly. Per MD notes, patient was supposed to have switched from Xarelto to Coumadin with Lovenox bridge which occurred on previous admission.   She was recently inpatient at [facility] and was just discharged on 10/2. Pharmacy had been dosing Coumadin with Lovenox bridge.   Last dose of warfarin and Xarelto PTA was on 10/3 PM. Lovenox was not charted as administered per facility MAR. Last dose of lovenox administered on previous admission on 10/2 @ 1250pm.   CBC stable and no s/s of bleeding reported at this time.    INR subtherapeutic today at 1.77 but trending up.  Goal of Therapy:  INR 2-3 Monitor platelets by anticoagulation protocol: Yes   Plan:  Warfarin 4mg  PO x 1 tonight Lovenox bridge 65mg  SubQ every 24 hours Daily INR and CBC Monitor for s/s bleeding  Daylene Posey, PharmD Pharmacy Resident Pager #: [phone] 08/03/2017 8:12 AM

## 2017-08-04 DIAGNOSIS — R498 Other voice and resonance disorders: Secondary | ICD-10-CM | POA: Diagnosis not present

## 2017-08-04 DIAGNOSIS — G464 Cerebellar stroke syndrome: Secondary | ICD-10-CM | POA: Diagnosis not present

## 2017-08-04 DIAGNOSIS — Z5320 Procedure and treatment not carried out because of patient's decision for unspecified reasons: Secondary | ICD-10-CM | POA: Diagnosis not present

## 2017-08-04 DIAGNOSIS — R2689 Other abnormalities of gait and mobility: Secondary | ICD-10-CM | POA: Diagnosis not present

## 2017-08-04 DIAGNOSIS — R1312 Dysphagia, oropharyngeal phase: Secondary | ICD-10-CM | POA: Diagnosis not present

## 2017-08-04 DIAGNOSIS — M6281 Muscle weakness (generalized): Secondary | ICD-10-CM | POA: Diagnosis not present

## 2017-08-04 DIAGNOSIS — Z66 Do not resuscitate: Secondary | ICD-10-CM | POA: Diagnosis not present

## 2017-08-04 DIAGNOSIS — F89 Unspecified disorder of psychological development: Secondary | ICD-10-CM | POA: Diagnosis not present

## 2017-08-04 DIAGNOSIS — R278 Other lack of coordination: Secondary | ICD-10-CM | POA: Diagnosis not present

## 2017-08-04 DIAGNOSIS — R627 Adult failure to thrive: Secondary | ICD-10-CM | POA: Diagnosis not present

## 2017-08-04 DIAGNOSIS — R296 Repeated falls: Secondary | ICD-10-CM | POA: Diagnosis not present

## 2017-08-04 DIAGNOSIS — I10 Essential (primary) hypertension: Secondary | ICD-10-CM | POA: Diagnosis not present

## 2017-08-04 DIAGNOSIS — F329 Major depressive disorder, single episode, unspecified: Secondary | ICD-10-CM | POA: Diagnosis not present

## 2017-08-04 DIAGNOSIS — R293 Abnormal posture: Secondary | ICD-10-CM | POA: Diagnosis not present

## 2017-08-04 DIAGNOSIS — J80 Acute respiratory distress syndrome: Secondary | ICD-10-CM | POA: Diagnosis not present

## 2017-08-04 DIAGNOSIS — R45851 Suicidal ideations: Secondary | ICD-10-CM | POA: Diagnosis not present

## 2017-08-04 DIAGNOSIS — R41841 Cognitive communication deficit: Secondary | ICD-10-CM | POA: Diagnosis not present

## 2017-08-04 DIAGNOSIS — S2241XS Multiple fractures of ribs, right side, sequela: Secondary | ICD-10-CM | POA: Diagnosis not present

## 2017-08-04 DIAGNOSIS — I639 Cerebral infarction, unspecified: Secondary | ICD-10-CM | POA: Diagnosis not present

## 2017-08-04 DIAGNOSIS — E119 Type 2 diabetes mellitus without complications: Secondary | ICD-10-CM | POA: Diagnosis not present

## 2017-08-04 DIAGNOSIS — R251 Tremor, unspecified: Secondary | ICD-10-CM | POA: Diagnosis not present

## 2017-08-04 DIAGNOSIS — J918 Pleural effusion in other conditions classified elsewhere: Secondary | ICD-10-CM | POA: Diagnosis not present

## 2017-08-04 LAB — CBC
HEMATOCRIT: 32.5 % — AB (ref 36.0–46.0)
Hemoglobin: 10.1 g/dL — ABNORMAL LOW (ref 12.0–15.0)
MCH: 29.1 pg (ref 26.0–34.0)
MCHC: 31.1 g/dL (ref 30.0–36.0)
MCV: 93.7 fL (ref 78.0–100.0)
Platelets: 160 10*3/uL (ref 150–400)
RBC: 3.47 MIL/uL — AB (ref 3.87–5.11)
RDW: 12.4 % (ref 11.5–15.5)
WBC: 7.7 10*3/uL (ref 4.0–10.5)

## 2017-08-04 LAB — PROTIME-INR
INR: 2
Prothrombin Time: 22.5 seconds — ABNORMAL HIGH (ref 11.4–15.2)

## 2017-08-04 LAB — GLUCOSE, CAPILLARY
GLUCOSE-CAPILLARY: 113 mg/dL — AB (ref 65–99)
GLUCOSE-CAPILLARY: 125 mg/dL — AB (ref 65–99)
GLUCOSE-CAPILLARY: 128 mg/dL — AB (ref 65–99)
Glucose-Capillary: 127 mg/dL — ABNORMAL HIGH (ref 65–99)

## 2017-08-04 LAB — BASIC METABOLIC PANEL
Anion gap: 9 (ref 5–15)
BUN: 29 mg/dL — ABNORMAL HIGH (ref 6–20)
CHLORIDE: 105 mmol/L (ref 101–111)
CO2: 24 mmol/L (ref 22–32)
CREATININE: 1.21 mg/dL — AB (ref 0.44–1.00)
Calcium: 8.5 mg/dL — ABNORMAL LOW (ref 8.9–10.3)
GFR calc non Af Amer: 41 mL/min — ABNORMAL LOW (ref >60)
GFR, EST AFRICAN AMERICAN: 48 mL/min — AB (ref >60)
Glucose, Bld: 130 mg/dL — ABNORMAL HIGH (ref 65–99)
POTASSIUM: 4.8 mmol/L (ref 3.5–5.1)
SODIUM: 138 mmol/L (ref 135–145)

## 2017-08-04 LAB — CULTURE, BLOOD (ROUTINE X 2)
Culture: NO GROWTH
Culture: NO GROWTH
SPECIAL REQUESTS: ADEQUATE
Special Requests: ADEQUATE

## 2017-08-04 MED ORDER — WARFARIN SODIUM 2 MG PO TABS: 3.0000 mg | ORAL_TABLET | Freq: Once | ORAL | Status: DC

## 2017-08-04 MED ORDER — AMOXICILLIN-POT CLAVULANATE 500-125 MG PO TABS: 500.0000 mg | ORAL_TABLET | Freq: Two times a day (BID) | ORAL | Status: DC

## 2017-08-04 NOTE — Plan of Care (Signed)
Problem: Safety: Goal: Ability to remain free from injury will improve Outcome: Progressing Bed alarm on, call light and personal items within reach, and pt agrees to call staff for assistance getting out of bed.  Problem: Pain Managment: Goal: General experience of comfort will improve Outcome: Progressing Denies c/o pain or discomfort.  Problem: Physical Regulation: Goal: Will remain free from infection Outcome: Progressing No s/s of infection

## 2017-08-04 NOTE — Progress Notes (Signed)
Patient discharged to SNF on 08/03/17 however no bed available. Patient still ready for discharge. INR has reached 2, however, would make no changes to Lovenox bridge until confirmed therapeutic range since it is right at 2. Discharge summary "discharge date" updated.  Jacquelin Hawking, MD [facility] 08/04/2017, 7:06 AM Pager: [phone]

## 2017-08-04 NOTE — Progress Notes (Signed)
ANTICOAGULATION CONSULT NOTE  Pharmacy Consult for Warfarin / Lovenox Indication: recurrent DVT   Allergies  Allergen Reactions  . Levofloxacin Other (See Comments)    unknown     Labs:  Recent Labs  08/02/17 0456 08/02/17 0658 08/03/17 0509 08/04/17 0400  HGB 10.6*  --  10.6* 10.1*  HCT 34.3*  --  32.6* 32.5*  PLT 161  --  186 160  LABPROT  --  18.5* 20.5* 22.5*  INR  --  1.55 1.77 2.00  CREATININE  --   --   --  1.21*    Estimated Creatinine Clearance: 37.4 mL/min (A) (by C-G formula based on SCr of 1.21 mg/dL (H)).   Assessment: 82 yo female admitted with hypoxia. 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   Per [facility] from [facility], patient was receiving warfarin and xarelto concomitantly. Per MD notes, patient was supposed to have switched from Xarelto to Coumadin with Lovenox bridge which occurred on previous admission.   CBC stable and no s/s of bleeding reported at this time.   INR just therapeutic today at 2  Goal of Therapy:  INR 2-3 Monitor platelets by anticoagulation protocol: Yes   Plan:  Warfarin 3mg  PO x 1 tonight Lovenox bridge 65mg  SubQ every 24 hours until INR therapeutic x 2 Daily INR and CBC Monitor for s/s bleeding  Thank you for allowing [place] to participate in this patients care.  Signe Colt, PharmD Clinical phone for 08/04/2017 from 7a-3:30p: [phone] If after 3:30p, please call main pharmacy at: [phone] 08/04/2017 10:44 AM

## 2017-08-04 NOTE — Discharge Instructions (Signed)

## 2017-08-04 NOTE — Progress Notes (Signed)
Patient will discharge to [facility]. Anticipated discharge date: 08/04/17 Family notified: Tanner Rothman, legal guardian Transportation by: [facility]  Nurse to call report to [phone].   CSW signing off.  Abigail Butts, LCSWA  Clinical Social Worker

## 2017-08-04 NOTE — Care Management Important Message (Signed)
Important Message  Patient Details  Name: [name] MRN: [id number] Date of Birth: 1935-06-15   Medicare Important Message Given:  Yes    Sadi Arave Abena 08/04/2017, 9:04 AM

## 2017-08-04 NOTE — Clinical Social Work Placement (Signed)
   CLINICAL SOCIAL WORK PLACEMENT  NOTE  Date:  08/04/2017  Patient Details  Name: [name] MRN: [id number] Date of Birth: 09-28-1935  Clinical Social Work is seeking post-discharge placement for this patient at the Skilled  Nursing Facility level of care (*CSW will initial, date and re-position this form in  chart as items are completed):  Yes   Patient/family provided with [facility] Clinical Social Work Department's list of facilities offering this level of care within the geographic area requested by the patient (or if unable, by the patient's family).  Yes   Patient/family informed of their freedom to choose among providers that offer the needed level of care, that participate in Medicare, Medicaid or managed care program needed by the patient, have an available bed and are willing to accept the patient.  Yes   Patient/family informed of [facility]'s ownership interest in [facility] and [facility], as well as of the fact that they are under no obligation to receive care at these facilities.  PASRR submitted to EDS on       PASRR number received on       Existing PASRR number confirmed on 08/01/17     FL2 transmitted to all facilities in geographic area requested by pt/family on 08/01/17     FL2 transmitted to all facilities within larger geographic area on       Patient informed that his/her managed care company has contracts with or will negotiate with certain facilities, including the following:  [employer]     Yes   Patient/family informed of bed offers received.  Patient chooses bed at [facility]     Physician recommends and patient chooses bed at      Patient to be transferred to [facility] on 08/04/17.  Patient to be transferred to facility by [facility]     Patient family notified on 08/04/17 of transfer.  Name of family member notified:        PHYSICIAN       Additional Comment:    _______________________________________________ Abigail Butts, LCSW 08/04/2017, 9:25 AM

## 2017-08-12 ENCOUNTER — Telehealth: Payer: Self-pay | Admitting: Family Medicine

## 2017-08-12 NOTE — Telephone Encounter (Signed)
If I am reading this correctly, do I need to call him back to discuss the case?  Do you have his number?

## 2017-08-12 NOTE — Telephone Encounter (Signed)
[name] with DSS called to find out about the xray results and obtain POA info for pt.  [name] [phone] are the state awarded guardians of the patient.  Xrays showed broken ribs and no xray of left hand only right hand and there were no fractures.  DSS confirmed "Care Taker Neglect" with the home where pt fell.

## 2017-08-13 NOTE — Telephone Encounter (Signed)
Nothing further to do.  Pt is now at a new facility [facility].  This was just for your information

## 2017-08-20 DIAGNOSIS — R627 Adult failure to thrive: Secondary | ICD-10-CM | POA: Diagnosis not present

## 2017-08-26 DIAGNOSIS — R627 Adult failure to thrive: Secondary | ICD-10-CM | POA: Diagnosis not present

## 2017-09-01 ENCOUNTER — Ambulatory Visit: Payer: Medicare Other | Admitting: Podiatry

## 2017-09-02 ENCOUNTER — Ambulatory Visit: Payer: Medicare Other | Admitting: Podiatry

## 2017-09-10 DIAGNOSIS — F89 Unspecified disorder of psychological development: Secondary | ICD-10-CM | POA: Diagnosis not present

## 2017-09-10 DIAGNOSIS — I10 Essential (primary) hypertension: Secondary | ICD-10-CM | POA: Diagnosis not present

## 2017-09-10 DIAGNOSIS — R251 Tremor, unspecified: Secondary | ICD-10-CM | POA: Diagnosis not present

## 2017-09-10 DIAGNOSIS — I639 Cerebral infarction, unspecified: Secondary | ICD-10-CM | POA: Diagnosis not present

## 2017-09-23 DIAGNOSIS — F329 Major depressive disorder, single episode, unspecified: Secondary | ICD-10-CM | POA: Diagnosis not present

## 2017-09-23 DIAGNOSIS — R45851 Suicidal ideations: Secondary | ICD-10-CM | POA: Diagnosis not present

## 2017-09-29 DIAGNOSIS — Z66 Do not resuscitate: Secondary | ICD-10-CM | POA: Diagnosis not present

## 2017-09-29 DIAGNOSIS — I10 Essential (primary) hypertension: Secondary | ICD-10-CM | POA: Diagnosis not present

## 2017-09-29 DIAGNOSIS — E119 Type 2 diabetes mellitus without complications: Secondary | ICD-10-CM | POA: Diagnosis not present

## 2017-10-09 DIAGNOSIS — R627 Adult failure to thrive: Secondary | ICD-10-CM | POA: Diagnosis not present

## 2017-10-23 DIAGNOSIS — F329 Major depressive disorder, single episode, unspecified: Secondary | ICD-10-CM | POA: Diagnosis not present

## 2017-10-28 DIAGNOSIS — R278 Other lack of coordination: Secondary | ICD-10-CM | POA: Diagnosis not present

## 2017-10-28 DIAGNOSIS — R2689 Other abnormalities of gait and mobility: Secondary | ICD-10-CM | POA: Diagnosis not present

## 2017-10-28 DIAGNOSIS — R296 Repeated falls: Secondary | ICD-10-CM | POA: Diagnosis not present

## 2017-10-28 DIAGNOSIS — R498 Other voice and resonance disorders: Secondary | ICD-10-CM | POA: Diagnosis not present

## 2017-10-28 DIAGNOSIS — Z5320 Procedure and treatment not carried out because of patient's decision for unspecified reasons: Secondary | ICD-10-CM | POA: Diagnosis not present

## 2017-10-28 DIAGNOSIS — M6281 Muscle weakness (generalized): Secondary | ICD-10-CM | POA: Diagnosis not present

## 2017-10-28 DIAGNOSIS — R293 Abnormal posture: Secondary | ICD-10-CM | POA: Diagnosis not present

## 2017-10-28 DIAGNOSIS — G464 Cerebellar stroke syndrome: Secondary | ICD-10-CM | POA: Diagnosis not present

## 2017-10-28 DIAGNOSIS — F89 Unspecified disorder of psychological development: Secondary | ICD-10-CM | POA: Diagnosis not present

## 2017-10-28 DIAGNOSIS — S2241XS Multiple fractures of ribs, right side, sequela: Secondary | ICD-10-CM | POA: Diagnosis not present

## 2017-10-28 DIAGNOSIS — R41841 Cognitive communication deficit: Secondary | ICD-10-CM | POA: Diagnosis not present

## 2017-10-28 DIAGNOSIS — R1312 Dysphagia, oropharyngeal phase: Secondary | ICD-10-CM | POA: Diagnosis not present

## 2017-10-29 DIAGNOSIS — Z5320 Procedure and treatment not carried out because of patient's decision for unspecified reasons: Secondary | ICD-10-CM | POA: Diagnosis not present

## 2017-11-07 DIAGNOSIS — F89 Unspecified disorder of psychological development: Secondary | ICD-10-CM | POA: Diagnosis not present

## 2017-11-12 DIAGNOSIS — R293 Abnormal posture: Secondary | ICD-10-CM | POA: Diagnosis not present

## 2017-11-12 DIAGNOSIS — R41841 Cognitive communication deficit: Secondary | ICD-10-CM | POA: Diagnosis not present

## 2017-11-12 DIAGNOSIS — G464 Cerebellar stroke syndrome: Secondary | ICD-10-CM | POA: Diagnosis not present

## 2017-11-12 DIAGNOSIS — R1312 Dysphagia, oropharyngeal phase: Secondary | ICD-10-CM | POA: Diagnosis not present

## 2017-11-12 DIAGNOSIS — R498 Other voice and resonance disorders: Secondary | ICD-10-CM | POA: Diagnosis not present

## 2017-11-12 DIAGNOSIS — R278 Other lack of coordination: Secondary | ICD-10-CM | POA: Diagnosis not present

## 2017-11-13 DIAGNOSIS — R293 Abnormal posture: Secondary | ICD-10-CM | POA: Diagnosis not present

## 2017-11-13 DIAGNOSIS — R278 Other lack of coordination: Secondary | ICD-10-CM | POA: Diagnosis not present

## 2017-11-13 DIAGNOSIS — R498 Other voice and resonance disorders: Secondary | ICD-10-CM | POA: Diagnosis not present

## 2017-11-13 DIAGNOSIS — G464 Cerebellar stroke syndrome: Secondary | ICD-10-CM | POA: Diagnosis not present

## 2017-11-13 DIAGNOSIS — R41841 Cognitive communication deficit: Secondary | ICD-10-CM | POA: Diagnosis not present

## 2017-11-13 DIAGNOSIS — R1312 Dysphagia, oropharyngeal phase: Secondary | ICD-10-CM | POA: Diagnosis not present

## 2017-11-14 DIAGNOSIS — R41841 Cognitive communication deficit: Secondary | ICD-10-CM | POA: Diagnosis not present

## 2017-11-14 DIAGNOSIS — G464 Cerebellar stroke syndrome: Secondary | ICD-10-CM | POA: Diagnosis not present

## 2017-11-14 DIAGNOSIS — R498 Other voice and resonance disorders: Secondary | ICD-10-CM | POA: Diagnosis not present

## 2017-11-14 DIAGNOSIS — R278 Other lack of coordination: Secondary | ICD-10-CM | POA: Diagnosis not present

## 2017-11-14 DIAGNOSIS — R293 Abnormal posture: Secondary | ICD-10-CM | POA: Diagnosis not present

## 2017-11-14 DIAGNOSIS — R1312 Dysphagia, oropharyngeal phase: Secondary | ICD-10-CM | POA: Diagnosis not present

## 2017-11-17 DIAGNOSIS — R41841 Cognitive communication deficit: Secondary | ICD-10-CM | POA: Diagnosis not present

## 2017-11-17 DIAGNOSIS — R498 Other voice and resonance disorders: Secondary | ICD-10-CM | POA: Diagnosis not present

## 2017-11-17 DIAGNOSIS — R293 Abnormal posture: Secondary | ICD-10-CM | POA: Diagnosis not present

## 2017-11-17 DIAGNOSIS — G464 Cerebellar stroke syndrome: Secondary | ICD-10-CM | POA: Diagnosis not present

## 2017-11-17 DIAGNOSIS — R1312 Dysphagia, oropharyngeal phase: Secondary | ICD-10-CM | POA: Diagnosis not present

## 2017-11-17 DIAGNOSIS — R278 Other lack of coordination: Secondary | ICD-10-CM | POA: Diagnosis not present

## 2017-11-18 DIAGNOSIS — R41841 Cognitive communication deficit: Secondary | ICD-10-CM | POA: Diagnosis not present

## 2017-11-18 DIAGNOSIS — R293 Abnormal posture: Secondary | ICD-10-CM | POA: Diagnosis not present

## 2017-11-18 DIAGNOSIS — R1312 Dysphagia, oropharyngeal phase: Secondary | ICD-10-CM | POA: Diagnosis not present

## 2017-11-18 DIAGNOSIS — G464 Cerebellar stroke syndrome: Secondary | ICD-10-CM | POA: Diagnosis not present

## 2017-11-18 DIAGNOSIS — R278 Other lack of coordination: Secondary | ICD-10-CM | POA: Diagnosis not present

## 2017-11-18 DIAGNOSIS — R498 Other voice and resonance disorders: Secondary | ICD-10-CM | POA: Diagnosis not present

## 2017-11-19 DIAGNOSIS — R498 Other voice and resonance disorders: Secondary | ICD-10-CM | POA: Diagnosis not present

## 2017-11-19 DIAGNOSIS — R293 Abnormal posture: Secondary | ICD-10-CM | POA: Diagnosis not present

## 2017-11-19 DIAGNOSIS — G464 Cerebellar stroke syndrome: Secondary | ICD-10-CM | POA: Diagnosis not present

## 2017-11-19 DIAGNOSIS — R1312 Dysphagia, oropharyngeal phase: Secondary | ICD-10-CM | POA: Diagnosis not present

## 2017-11-19 DIAGNOSIS — R41841 Cognitive communication deficit: Secondary | ICD-10-CM | POA: Diagnosis not present

## 2017-11-19 DIAGNOSIS — R278 Other lack of coordination: Secondary | ICD-10-CM | POA: Diagnosis not present

## 2017-11-19 DIAGNOSIS — R627 Adult failure to thrive: Secondary | ICD-10-CM | POA: Diagnosis not present

## 2017-11-20 DIAGNOSIS — R293 Abnormal posture: Secondary | ICD-10-CM | POA: Diagnosis not present

## 2017-11-20 DIAGNOSIS — R498 Other voice and resonance disorders: Secondary | ICD-10-CM | POA: Diagnosis not present

## 2017-11-20 DIAGNOSIS — R41841 Cognitive communication deficit: Secondary | ICD-10-CM | POA: Diagnosis not present

## 2017-11-20 DIAGNOSIS — G464 Cerebellar stroke syndrome: Secondary | ICD-10-CM | POA: Diagnosis not present

## 2017-11-20 DIAGNOSIS — R1312 Dysphagia, oropharyngeal phase: Secondary | ICD-10-CM | POA: Diagnosis not present

## 2017-11-20 DIAGNOSIS — R278 Other lack of coordination: Secondary | ICD-10-CM | POA: Diagnosis not present

## 2017-11-21 DIAGNOSIS — R293 Abnormal posture: Secondary | ICD-10-CM | POA: Diagnosis not present

## 2017-11-21 DIAGNOSIS — R278 Other lack of coordination: Secondary | ICD-10-CM | POA: Diagnosis not present

## 2017-11-21 DIAGNOSIS — R41841 Cognitive communication deficit: Secondary | ICD-10-CM | POA: Diagnosis not present

## 2017-11-21 DIAGNOSIS — R1312 Dysphagia, oropharyngeal phase: Secondary | ICD-10-CM | POA: Diagnosis not present

## 2017-11-21 DIAGNOSIS — G464 Cerebellar stroke syndrome: Secondary | ICD-10-CM | POA: Diagnosis not present

## 2017-11-21 DIAGNOSIS — R498 Other voice and resonance disorders: Secondary | ICD-10-CM | POA: Diagnosis not present

## 2017-11-24 DIAGNOSIS — R278 Other lack of coordination: Secondary | ICD-10-CM | POA: Diagnosis not present

## 2017-11-24 DIAGNOSIS — R1312 Dysphagia, oropharyngeal phase: Secondary | ICD-10-CM | POA: Diagnosis not present

## 2017-11-24 DIAGNOSIS — G464 Cerebellar stroke syndrome: Secondary | ICD-10-CM | POA: Diagnosis not present

## 2017-11-24 DIAGNOSIS — R498 Other voice and resonance disorders: Secondary | ICD-10-CM | POA: Diagnosis not present

## 2017-11-24 DIAGNOSIS — R41841 Cognitive communication deficit: Secondary | ICD-10-CM | POA: Diagnosis not present

## 2017-11-24 DIAGNOSIS — R293 Abnormal posture: Secondary | ICD-10-CM | POA: Diagnosis not present

## 2017-11-25 DIAGNOSIS — R293 Abnormal posture: Secondary | ICD-10-CM | POA: Diagnosis not present

## 2017-11-25 DIAGNOSIS — R1312 Dysphagia, oropharyngeal phase: Secondary | ICD-10-CM | POA: Diagnosis not present

## 2017-11-25 DIAGNOSIS — G464 Cerebellar stroke syndrome: Secondary | ICD-10-CM | POA: Diagnosis not present

## 2017-11-25 DIAGNOSIS — R278 Other lack of coordination: Secondary | ICD-10-CM | POA: Diagnosis not present

## 2017-11-25 DIAGNOSIS — R41841 Cognitive communication deficit: Secondary | ICD-10-CM | POA: Diagnosis not present

## 2017-11-25 DIAGNOSIS — R498 Other voice and resonance disorders: Secondary | ICD-10-CM | POA: Diagnosis not present

## 2017-11-26 DIAGNOSIS — R278 Other lack of coordination: Secondary | ICD-10-CM | POA: Diagnosis not present

## 2017-11-26 DIAGNOSIS — R498 Other voice and resonance disorders: Secondary | ICD-10-CM | POA: Diagnosis not present

## 2017-11-26 DIAGNOSIS — R41841 Cognitive communication deficit: Secondary | ICD-10-CM | POA: Diagnosis not present

## 2017-11-26 DIAGNOSIS — R293 Abnormal posture: Secondary | ICD-10-CM | POA: Diagnosis not present

## 2017-11-26 DIAGNOSIS — R1312 Dysphagia, oropharyngeal phase: Secondary | ICD-10-CM | POA: Diagnosis not present

## 2017-11-26 DIAGNOSIS — G464 Cerebellar stroke syndrome: Secondary | ICD-10-CM | POA: Diagnosis not present

## 2017-11-27 DIAGNOSIS — R41841 Cognitive communication deficit: Secondary | ICD-10-CM | POA: Diagnosis not present

## 2017-11-27 DIAGNOSIS — G464 Cerebellar stroke syndrome: Secondary | ICD-10-CM | POA: Diagnosis not present

## 2017-11-27 DIAGNOSIS — R293 Abnormal posture: Secondary | ICD-10-CM | POA: Diagnosis not present

## 2017-11-27 DIAGNOSIS — R1312 Dysphagia, oropharyngeal phase: Secondary | ICD-10-CM | POA: Diagnosis not present

## 2017-11-27 DIAGNOSIS — R278 Other lack of coordination: Secondary | ICD-10-CM | POA: Diagnosis not present

## 2017-11-27 DIAGNOSIS — R498 Other voice and resonance disorders: Secondary | ICD-10-CM | POA: Diagnosis not present

## 2017-11-28 DIAGNOSIS — R498 Other voice and resonance disorders: Secondary | ICD-10-CM | POA: Diagnosis not present

## 2017-11-28 DIAGNOSIS — R1312 Dysphagia, oropharyngeal phase: Secondary | ICD-10-CM | POA: Diagnosis not present

## 2017-11-28 DIAGNOSIS — G464 Cerebellar stroke syndrome: Secondary | ICD-10-CM | POA: Diagnosis not present

## 2017-11-28 DIAGNOSIS — R293 Abnormal posture: Secondary | ICD-10-CM | POA: Diagnosis not present

## 2017-11-28 DIAGNOSIS — R41841 Cognitive communication deficit: Secondary | ICD-10-CM | POA: Diagnosis not present

## 2017-11-28 DIAGNOSIS — R278 Other lack of coordination: Secondary | ICD-10-CM | POA: Diagnosis not present

## 2017-12-01 DIAGNOSIS — R41841 Cognitive communication deficit: Secondary | ICD-10-CM | POA: Diagnosis not present

## 2017-12-01 DIAGNOSIS — R293 Abnormal posture: Secondary | ICD-10-CM | POA: Diagnosis not present

## 2017-12-01 DIAGNOSIS — R1312 Dysphagia, oropharyngeal phase: Secondary | ICD-10-CM | POA: Diagnosis not present

## 2017-12-01 DIAGNOSIS — G464 Cerebellar stroke syndrome: Secondary | ICD-10-CM | POA: Diagnosis not present

## 2017-12-01 DIAGNOSIS — R278 Other lack of coordination: Secondary | ICD-10-CM | POA: Diagnosis not present

## 2017-12-01 DIAGNOSIS — R498 Other voice and resonance disorders: Secondary | ICD-10-CM | POA: Diagnosis not present

## 2017-12-02 DIAGNOSIS — R498 Other voice and resonance disorders: Secondary | ICD-10-CM | POA: Diagnosis not present

## 2017-12-02 DIAGNOSIS — R293 Abnormal posture: Secondary | ICD-10-CM | POA: Diagnosis not present

## 2017-12-02 DIAGNOSIS — R278 Other lack of coordination: Secondary | ICD-10-CM | POA: Diagnosis not present

## 2017-12-02 DIAGNOSIS — G464 Cerebellar stroke syndrome: Secondary | ICD-10-CM | POA: Diagnosis not present

## 2017-12-02 DIAGNOSIS — R41841 Cognitive communication deficit: Secondary | ICD-10-CM | POA: Diagnosis not present

## 2017-12-02 DIAGNOSIS — R1312 Dysphagia, oropharyngeal phase: Secondary | ICD-10-CM | POA: Diagnosis not present

## 2017-12-03 DIAGNOSIS — R498 Other voice and resonance disorders: Secondary | ICD-10-CM | POA: Diagnosis not present

## 2017-12-03 DIAGNOSIS — R278 Other lack of coordination: Secondary | ICD-10-CM | POA: Diagnosis not present

## 2017-12-03 DIAGNOSIS — G464 Cerebellar stroke syndrome: Secondary | ICD-10-CM | POA: Diagnosis not present

## 2017-12-03 DIAGNOSIS — R293 Abnormal posture: Secondary | ICD-10-CM | POA: Diagnosis not present

## 2017-12-03 DIAGNOSIS — R1312 Dysphagia, oropharyngeal phase: Secondary | ICD-10-CM | POA: Diagnosis not present

## 2017-12-03 DIAGNOSIS — R41841 Cognitive communication deficit: Secondary | ICD-10-CM | POA: Diagnosis not present

## 2017-12-04 DIAGNOSIS — R293 Abnormal posture: Secondary | ICD-10-CM | POA: Diagnosis not present

## 2017-12-04 DIAGNOSIS — R278 Other lack of coordination: Secondary | ICD-10-CM | POA: Diagnosis not present

## 2017-12-04 DIAGNOSIS — R498 Other voice and resonance disorders: Secondary | ICD-10-CM | POA: Diagnosis not present

## 2017-12-04 DIAGNOSIS — G464 Cerebellar stroke syndrome: Secondary | ICD-10-CM | POA: Diagnosis not present

## 2017-12-04 DIAGNOSIS — R1312 Dysphagia, oropharyngeal phase: Secondary | ICD-10-CM | POA: Diagnosis not present

## 2017-12-04 DIAGNOSIS — R41841 Cognitive communication deficit: Secondary | ICD-10-CM | POA: Diagnosis not present

## 2017-12-05 DIAGNOSIS — R498 Other voice and resonance disorders: Secondary | ICD-10-CM | POA: Diagnosis not present

## 2017-12-05 DIAGNOSIS — R278 Other lack of coordination: Secondary | ICD-10-CM | POA: Diagnosis not present

## 2017-12-05 DIAGNOSIS — R41841 Cognitive communication deficit: Secondary | ICD-10-CM | POA: Diagnosis not present

## 2017-12-05 DIAGNOSIS — G464 Cerebellar stroke syndrome: Secondary | ICD-10-CM | POA: Diagnosis not present

## 2017-12-05 DIAGNOSIS — R1312 Dysphagia, oropharyngeal phase: Secondary | ICD-10-CM | POA: Diagnosis not present

## 2017-12-05 DIAGNOSIS — R293 Abnormal posture: Secondary | ICD-10-CM | POA: Diagnosis not present

## 2017-12-08 DIAGNOSIS — R41841 Cognitive communication deficit: Secondary | ICD-10-CM | POA: Diagnosis not present

## 2017-12-08 DIAGNOSIS — R1312 Dysphagia, oropharyngeal phase: Secondary | ICD-10-CM | POA: Diagnosis not present

## 2017-12-08 DIAGNOSIS — R278 Other lack of coordination: Secondary | ICD-10-CM | POA: Diagnosis not present

## 2017-12-08 DIAGNOSIS — R293 Abnormal posture: Secondary | ICD-10-CM | POA: Diagnosis not present

## 2017-12-08 DIAGNOSIS — G464 Cerebellar stroke syndrome: Secondary | ICD-10-CM | POA: Diagnosis not present

## 2017-12-08 DIAGNOSIS — R498 Other voice and resonance disorders: Secondary | ICD-10-CM | POA: Diagnosis not present

## 2017-12-09 DIAGNOSIS — R278 Other lack of coordination: Secondary | ICD-10-CM | POA: Diagnosis not present

## 2017-12-09 DIAGNOSIS — R1312 Dysphagia, oropharyngeal phase: Secondary | ICD-10-CM | POA: Diagnosis not present

## 2017-12-09 DIAGNOSIS — G464 Cerebellar stroke syndrome: Secondary | ICD-10-CM | POA: Diagnosis not present

## 2017-12-09 DIAGNOSIS — R41841 Cognitive communication deficit: Secondary | ICD-10-CM | POA: Diagnosis not present

## 2017-12-09 DIAGNOSIS — R498 Other voice and resonance disorders: Secondary | ICD-10-CM | POA: Diagnosis not present

## 2017-12-09 DIAGNOSIS — R293 Abnormal posture: Secondary | ICD-10-CM | POA: Diagnosis not present

## 2017-12-10 DIAGNOSIS — R278 Other lack of coordination: Secondary | ICD-10-CM | POA: Diagnosis not present

## 2017-12-10 DIAGNOSIS — R41841 Cognitive communication deficit: Secondary | ICD-10-CM | POA: Diagnosis not present

## 2017-12-10 DIAGNOSIS — G464 Cerebellar stroke syndrome: Secondary | ICD-10-CM | POA: Diagnosis not present

## 2017-12-10 DIAGNOSIS — R498 Other voice and resonance disorders: Secondary | ICD-10-CM | POA: Diagnosis not present

## 2017-12-10 DIAGNOSIS — R1312 Dysphagia, oropharyngeal phase: Secondary | ICD-10-CM | POA: Diagnosis not present

## 2017-12-10 DIAGNOSIS — R293 Abnormal posture: Secondary | ICD-10-CM | POA: Diagnosis not present

## 2017-12-11 DIAGNOSIS — R498 Other voice and resonance disorders: Secondary | ICD-10-CM | POA: Diagnosis not present

## 2017-12-11 DIAGNOSIS — R278 Other lack of coordination: Secondary | ICD-10-CM | POA: Diagnosis not present

## 2017-12-11 DIAGNOSIS — R1312 Dysphagia, oropharyngeal phase: Secondary | ICD-10-CM | POA: Diagnosis not present

## 2017-12-11 DIAGNOSIS — R293 Abnormal posture: Secondary | ICD-10-CM | POA: Diagnosis not present

## 2017-12-11 DIAGNOSIS — G464 Cerebellar stroke syndrome: Secondary | ICD-10-CM | POA: Diagnosis not present

## 2017-12-11 DIAGNOSIS — R41841 Cognitive communication deficit: Secondary | ICD-10-CM | POA: Diagnosis not present

## 2017-12-12 DIAGNOSIS — G464 Cerebellar stroke syndrome: Secondary | ICD-10-CM | POA: Diagnosis not present

## 2017-12-12 DIAGNOSIS — R498 Other voice and resonance disorders: Secondary | ICD-10-CM | POA: Diagnosis not present

## 2017-12-12 DIAGNOSIS — R41841 Cognitive communication deficit: Secondary | ICD-10-CM | POA: Diagnosis not present

## 2017-12-12 DIAGNOSIS — R1312 Dysphagia, oropharyngeal phase: Secondary | ICD-10-CM | POA: Diagnosis not present

## 2017-12-12 DIAGNOSIS — R293 Abnormal posture: Secondary | ICD-10-CM | POA: Diagnosis not present

## 2017-12-12 DIAGNOSIS — R278 Other lack of coordination: Secondary | ICD-10-CM | POA: Diagnosis not present

## 2017-12-13 DIAGNOSIS — R498 Other voice and resonance disorders: Secondary | ICD-10-CM | POA: Diagnosis not present

## 2017-12-13 DIAGNOSIS — G464 Cerebellar stroke syndrome: Secondary | ICD-10-CM | POA: Diagnosis not present

## 2017-12-13 DIAGNOSIS — R41841 Cognitive communication deficit: Secondary | ICD-10-CM | POA: Diagnosis not present

## 2017-12-13 DIAGNOSIS — R1312 Dysphagia, oropharyngeal phase: Secondary | ICD-10-CM | POA: Diagnosis not present

## 2017-12-13 DIAGNOSIS — R278 Other lack of coordination: Secondary | ICD-10-CM | POA: Diagnosis not present

## 2017-12-13 DIAGNOSIS — R293 Abnormal posture: Secondary | ICD-10-CM | POA: Diagnosis not present

## 2017-12-16 DIAGNOSIS — G464 Cerebellar stroke syndrome: Secondary | ICD-10-CM | POA: Diagnosis not present

## 2017-12-16 DIAGNOSIS — R05 Cough: Secondary | ICD-10-CM | POA: Diagnosis not present

## 2017-12-16 DIAGNOSIS — R41841 Cognitive communication deficit: Secondary | ICD-10-CM | POA: Diagnosis not present

## 2017-12-16 DIAGNOSIS — R293 Abnormal posture: Secondary | ICD-10-CM | POA: Diagnosis not present

## 2017-12-16 DIAGNOSIS — R278 Other lack of coordination: Secondary | ICD-10-CM | POA: Diagnosis not present

## 2017-12-16 DIAGNOSIS — R112 Nausea with vomiting, unspecified: Secondary | ICD-10-CM | POA: Diagnosis not present

## 2017-12-16 DIAGNOSIS — R1312 Dysphagia, oropharyngeal phase: Secondary | ICD-10-CM | POA: Diagnosis not present

## 2017-12-16 DIAGNOSIS — R498 Other voice and resonance disorders: Secondary | ICD-10-CM | POA: Diagnosis not present

## 2017-12-17 DIAGNOSIS — R278 Other lack of coordination: Secondary | ICD-10-CM | POA: Diagnosis not present

## 2017-12-17 DIAGNOSIS — R41841 Cognitive communication deficit: Secondary | ICD-10-CM | POA: Diagnosis not present

## 2017-12-17 DIAGNOSIS — G464 Cerebellar stroke syndrome: Secondary | ICD-10-CM | POA: Diagnosis not present

## 2017-12-17 DIAGNOSIS — J189 Pneumonia, unspecified organism: Secondary | ICD-10-CM | POA: Diagnosis not present

## 2017-12-17 DIAGNOSIS — R293 Abnormal posture: Secondary | ICD-10-CM | POA: Diagnosis not present

## 2017-12-17 DIAGNOSIS — R05 Cough: Secondary | ICD-10-CM | POA: Diagnosis not present

## 2017-12-17 DIAGNOSIS — I509 Heart failure, unspecified: Secondary | ICD-10-CM | POA: Diagnosis not present

## 2017-12-17 DIAGNOSIS — R1312 Dysphagia, oropharyngeal phase: Secondary | ICD-10-CM | POA: Diagnosis not present

## 2017-12-17 DIAGNOSIS — R498 Other voice and resonance disorders: Secondary | ICD-10-CM | POA: Diagnosis not present

## 2017-12-18 DIAGNOSIS — R278 Other lack of coordination: Secondary | ICD-10-CM | POA: Diagnosis not present

## 2017-12-18 DIAGNOSIS — G464 Cerebellar stroke syndrome: Secondary | ICD-10-CM | POA: Diagnosis not present

## 2017-12-18 DIAGNOSIS — R41841 Cognitive communication deficit: Secondary | ICD-10-CM | POA: Diagnosis not present

## 2017-12-18 DIAGNOSIS — I509 Heart failure, unspecified: Secondary | ICD-10-CM | POA: Diagnosis not present

## 2017-12-18 DIAGNOSIS — R293 Abnormal posture: Secondary | ICD-10-CM | POA: Diagnosis not present

## 2017-12-18 DIAGNOSIS — J189 Pneumonia, unspecified organism: Secondary | ICD-10-CM | POA: Diagnosis not present

## 2017-12-18 DIAGNOSIS — R498 Other voice and resonance disorders: Secondary | ICD-10-CM | POA: Diagnosis not present

## 2017-12-18 DIAGNOSIS — R1312 Dysphagia, oropharyngeal phase: Secondary | ICD-10-CM | POA: Diagnosis not present

## 2017-12-19 DIAGNOSIS — R278 Other lack of coordination: Secondary | ICD-10-CM | POA: Diagnosis not present

## 2017-12-19 DIAGNOSIS — R41841 Cognitive communication deficit: Secondary | ICD-10-CM | POA: Diagnosis not present

## 2017-12-19 DIAGNOSIS — I10 Essential (primary) hypertension: Secondary | ICD-10-CM | POA: Diagnosis not present

## 2017-12-19 DIAGNOSIS — G464 Cerebellar stroke syndrome: Secondary | ICD-10-CM | POA: Diagnosis not present

## 2017-12-19 DIAGNOSIS — D649 Anemia, unspecified: Secondary | ICD-10-CM | POA: Diagnosis not present

## 2017-12-19 DIAGNOSIS — J189 Pneumonia, unspecified organism: Secondary | ICD-10-CM | POA: Diagnosis not present

## 2017-12-19 DIAGNOSIS — R1312 Dysphagia, oropharyngeal phase: Secondary | ICD-10-CM | POA: Diagnosis not present

## 2017-12-19 DIAGNOSIS — E0821 Diabetes mellitus due to underlying condition with diabetic nephropathy: Secondary | ICD-10-CM | POA: Diagnosis not present

## 2017-12-19 DIAGNOSIS — R498 Other voice and resonance disorders: Secondary | ICD-10-CM | POA: Diagnosis not present

## 2017-12-19 DIAGNOSIS — Z79899 Other long term (current) drug therapy: Secondary | ICD-10-CM | POA: Diagnosis not present

## 2017-12-19 DIAGNOSIS — I509 Heart failure, unspecified: Secondary | ICD-10-CM | POA: Diagnosis not present

## 2017-12-19 DIAGNOSIS — R293 Abnormal posture: Secondary | ICD-10-CM | POA: Diagnosis not present

## 2017-12-19 DIAGNOSIS — I130 Hypertensive heart and chronic kidney disease with heart failure and stage 1 through stage 4 chronic kidney disease, or unspecified chronic kidney disease: Secondary | ICD-10-CM | POA: Diagnosis not present

## 2017-12-22 DIAGNOSIS — R278 Other lack of coordination: Secondary | ICD-10-CM | POA: Diagnosis not present

## 2017-12-22 DIAGNOSIS — G464 Cerebellar stroke syndrome: Secondary | ICD-10-CM | POA: Diagnosis not present

## 2017-12-22 DIAGNOSIS — R498 Other voice and resonance disorders: Secondary | ICD-10-CM | POA: Diagnosis not present

## 2017-12-22 DIAGNOSIS — R41841 Cognitive communication deficit: Secondary | ICD-10-CM | POA: Diagnosis not present

## 2017-12-22 DIAGNOSIS — R1312 Dysphagia, oropharyngeal phase: Secondary | ICD-10-CM | POA: Diagnosis not present

## 2017-12-22 DIAGNOSIS — J189 Pneumonia, unspecified organism: Secondary | ICD-10-CM | POA: Diagnosis not present

## 2017-12-22 DIAGNOSIS — I509 Heart failure, unspecified: Secondary | ICD-10-CM | POA: Diagnosis not present

## 2017-12-22 DIAGNOSIS — R293 Abnormal posture: Secondary | ICD-10-CM | POA: Diagnosis not present

## 2017-12-23 DIAGNOSIS — R278 Other lack of coordination: Secondary | ICD-10-CM | POA: Diagnosis not present

## 2017-12-23 DIAGNOSIS — R1312 Dysphagia, oropharyngeal phase: Secondary | ICD-10-CM | POA: Diagnosis not present

## 2017-12-23 DIAGNOSIS — G464 Cerebellar stroke syndrome: Secondary | ICD-10-CM | POA: Diagnosis not present

## 2017-12-23 DIAGNOSIS — I509 Heart failure, unspecified: Secondary | ICD-10-CM | POA: Diagnosis not present

## 2017-12-23 DIAGNOSIS — J189 Pneumonia, unspecified organism: Secondary | ICD-10-CM | POA: Diagnosis not present

## 2017-12-23 DIAGNOSIS — R498 Other voice and resonance disorders: Secondary | ICD-10-CM | POA: Diagnosis not present

## 2017-12-23 DIAGNOSIS — R293 Abnormal posture: Secondary | ICD-10-CM | POA: Diagnosis not present

## 2017-12-23 DIAGNOSIS — R41841 Cognitive communication deficit: Secondary | ICD-10-CM | POA: Diagnosis not present

## 2017-12-24 DIAGNOSIS — G464 Cerebellar stroke syndrome: Secondary | ICD-10-CM | POA: Diagnosis not present

## 2017-12-24 DIAGNOSIS — R293 Abnormal posture: Secondary | ICD-10-CM | POA: Diagnosis not present

## 2017-12-24 DIAGNOSIS — R278 Other lack of coordination: Secondary | ICD-10-CM | POA: Diagnosis not present

## 2017-12-24 DIAGNOSIS — R1312 Dysphagia, oropharyngeal phase: Secondary | ICD-10-CM | POA: Diagnosis not present

## 2017-12-24 DIAGNOSIS — J189 Pneumonia, unspecified organism: Secondary | ICD-10-CM | POA: Diagnosis not present

## 2017-12-24 DIAGNOSIS — I509 Heart failure, unspecified: Secondary | ICD-10-CM | POA: Diagnosis not present

## 2017-12-24 DIAGNOSIS — R41841 Cognitive communication deficit: Secondary | ICD-10-CM | POA: Diagnosis not present

## 2017-12-24 DIAGNOSIS — R498 Other voice and resonance disorders: Secondary | ICD-10-CM | POA: Diagnosis not present

## 2017-12-25 DIAGNOSIS — R293 Abnormal posture: Secondary | ICD-10-CM | POA: Diagnosis not present

## 2017-12-25 DIAGNOSIS — R278 Other lack of coordination: Secondary | ICD-10-CM | POA: Diagnosis not present

## 2017-12-25 DIAGNOSIS — I509 Heart failure, unspecified: Secondary | ICD-10-CM | POA: Diagnosis not present

## 2017-12-25 DIAGNOSIS — R41841 Cognitive communication deficit: Secondary | ICD-10-CM | POA: Diagnosis not present

## 2017-12-25 DIAGNOSIS — M542 Cervicalgia: Secondary | ICD-10-CM | POA: Diagnosis not present

## 2017-12-25 DIAGNOSIS — G464 Cerebellar stroke syndrome: Secondary | ICD-10-CM | POA: Diagnosis not present

## 2017-12-25 DIAGNOSIS — J189 Pneumonia, unspecified organism: Secondary | ICD-10-CM | POA: Diagnosis not present

## 2017-12-25 DIAGNOSIS — R1312 Dysphagia, oropharyngeal phase: Secondary | ICD-10-CM | POA: Diagnosis not present

## 2017-12-25 DIAGNOSIS — R498 Other voice and resonance disorders: Secondary | ICD-10-CM | POA: Diagnosis not present

## 2017-12-26 DIAGNOSIS — M6281 Muscle weakness (generalized): Secondary | ICD-10-CM | POA: Diagnosis not present

## 2017-12-26 DIAGNOSIS — R2689 Other abnormalities of gait and mobility: Secondary | ICD-10-CM | POA: Diagnosis not present

## 2017-12-29 DIAGNOSIS — R2689 Other abnormalities of gait and mobility: Secondary | ICD-10-CM | POA: Diagnosis not present

## 2017-12-29 DIAGNOSIS — M6281 Muscle weakness (generalized): Secondary | ICD-10-CM | POA: Diagnosis not present

## 2017-12-30 DIAGNOSIS — M542 Cervicalgia: Secondary | ICD-10-CM | POA: Diagnosis not present

## 2017-12-30 DIAGNOSIS — R2689 Other abnormalities of gait and mobility: Secondary | ICD-10-CM | POA: Diagnosis not present

## 2017-12-30 DIAGNOSIS — M6281 Muscle weakness (generalized): Secondary | ICD-10-CM | POA: Diagnosis not present

## 2017-12-30 DIAGNOSIS — I509 Heart failure, unspecified: Secondary | ICD-10-CM | POA: Diagnosis not present

## 2017-12-30 DIAGNOSIS — R05 Cough: Secondary | ICD-10-CM | POA: Diagnosis not present

## 2017-12-31 DIAGNOSIS — M6281 Muscle weakness (generalized): Secondary | ICD-10-CM | POA: Diagnosis not present

## 2017-12-31 DIAGNOSIS — R2689 Other abnormalities of gait and mobility: Secondary | ICD-10-CM | POA: Diagnosis not present

## 2018-01-02 DIAGNOSIS — R2689 Other abnormalities of gait and mobility: Secondary | ICD-10-CM | POA: Diagnosis not present

## 2018-01-02 DIAGNOSIS — M6281 Muscle weakness (generalized): Secondary | ICD-10-CM | POA: Diagnosis not present

## 2018-01-03 DIAGNOSIS — M6281 Muscle weakness (generalized): Secondary | ICD-10-CM | POA: Diagnosis not present

## 2018-01-03 DIAGNOSIS — R2689 Other abnormalities of gait and mobility: Secondary | ICD-10-CM | POA: Diagnosis not present

## 2018-01-05 DIAGNOSIS — R2689 Other abnormalities of gait and mobility: Secondary | ICD-10-CM | POA: Diagnosis not present

## 2018-01-05 DIAGNOSIS — M6281 Muscle weakness (generalized): Secondary | ICD-10-CM | POA: Diagnosis not present

## 2018-01-07 DIAGNOSIS — R2689 Other abnormalities of gait and mobility: Secondary | ICD-10-CM | POA: Diagnosis not present

## 2018-01-07 DIAGNOSIS — M6281 Muscle weakness (generalized): Secondary | ICD-10-CM | POA: Diagnosis not present

## 2018-01-08 DIAGNOSIS — M6281 Muscle weakness (generalized): Secondary | ICD-10-CM | POA: Diagnosis not present

## 2018-01-08 DIAGNOSIS — R2689 Other abnormalities of gait and mobility: Secondary | ICD-10-CM | POA: Diagnosis not present

## 2018-01-09 DIAGNOSIS — M6281 Muscle weakness (generalized): Secondary | ICD-10-CM | POA: Diagnosis not present

## 2018-01-09 DIAGNOSIS — R2689 Other abnormalities of gait and mobility: Secondary | ICD-10-CM | POA: Diagnosis not present

## 2018-01-12 DIAGNOSIS — M6281 Muscle weakness (generalized): Secondary | ICD-10-CM | POA: Diagnosis not present

## 2018-01-12 DIAGNOSIS — R2689 Other abnormalities of gait and mobility: Secondary | ICD-10-CM | POA: Diagnosis not present

## 2018-01-13 DIAGNOSIS — M6281 Muscle weakness (generalized): Secondary | ICD-10-CM | POA: Diagnosis not present

## 2018-01-13 DIAGNOSIS — R2689 Other abnormalities of gait and mobility: Secondary | ICD-10-CM | POA: Diagnosis not present

## 2018-01-14 DIAGNOSIS — R2689 Other abnormalities of gait and mobility: Secondary | ICD-10-CM | POA: Diagnosis not present

## 2018-01-14 DIAGNOSIS — M6281 Muscle weakness (generalized): Secondary | ICD-10-CM | POA: Diagnosis not present

## 2018-01-15 DIAGNOSIS — R2689 Other abnormalities of gait and mobility: Secondary | ICD-10-CM | POA: Diagnosis not present

## 2018-01-15 DIAGNOSIS — M6281 Muscle weakness (generalized): Secondary | ICD-10-CM | POA: Diagnosis not present

## 2018-01-16 DIAGNOSIS — R2689 Other abnormalities of gait and mobility: Secondary | ICD-10-CM | POA: Diagnosis not present

## 2018-01-16 DIAGNOSIS — M6281 Muscle weakness (generalized): Secondary | ICD-10-CM | POA: Diagnosis not present

## 2018-01-19 DIAGNOSIS — M6281 Muscle weakness (generalized): Secondary | ICD-10-CM | POA: Diagnosis not present

## 2018-01-19 DIAGNOSIS — R2689 Other abnormalities of gait and mobility: Secondary | ICD-10-CM | POA: Diagnosis not present

## 2018-01-20 DIAGNOSIS — M6281 Muscle weakness (generalized): Secondary | ICD-10-CM | POA: Diagnosis not present

## 2018-01-20 DIAGNOSIS — R2689 Other abnormalities of gait and mobility: Secondary | ICD-10-CM | POA: Diagnosis not present

## 2018-01-21 DIAGNOSIS — M6281 Muscle weakness (generalized): Secondary | ICD-10-CM | POA: Diagnosis not present

## 2018-01-21 DIAGNOSIS — R2689 Other abnormalities of gait and mobility: Secondary | ICD-10-CM | POA: Diagnosis not present

## 2018-01-22 DIAGNOSIS — R2689 Other abnormalities of gait and mobility: Secondary | ICD-10-CM | POA: Diagnosis not present

## 2018-01-22 DIAGNOSIS — M6281 Muscle weakness (generalized): Secondary | ICD-10-CM | POA: Diagnosis not present

## 2018-01-23 DIAGNOSIS — R2689 Other abnormalities of gait and mobility: Secondary | ICD-10-CM | POA: Diagnosis not present

## 2018-01-23 DIAGNOSIS — M6281 Muscle weakness (generalized): Secondary | ICD-10-CM | POA: Diagnosis not present

## 2018-02-05 DIAGNOSIS — R627 Adult failure to thrive: Secondary | ICD-10-CM | POA: Diagnosis not present

## 2018-02-16 DIAGNOSIS — I509 Heart failure, unspecified: Secondary | ICD-10-CM | POA: Diagnosis not present

## 2018-02-16 DIAGNOSIS — M542 Cervicalgia: Secondary | ICD-10-CM | POA: Diagnosis not present

## 2018-03-11 DIAGNOSIS — F89 Unspecified disorder of psychological development: Secondary | ICD-10-CM | POA: Diagnosis not present

## 2018-03-11 DIAGNOSIS — R05 Cough: Secondary | ICD-10-CM | POA: Diagnosis not present

## 2018-03-11 DIAGNOSIS — D631 Anemia in chronic kidney disease: Secondary | ICD-10-CM | POA: Diagnosis not present

## 2018-03-11 DIAGNOSIS — J189 Pneumonia, unspecified organism: Secondary | ICD-10-CM | POA: Diagnosis not present

## 2018-03-11 DIAGNOSIS — E119 Type 2 diabetes mellitus without complications: Secondary | ICD-10-CM | POA: Diagnosis not present

## 2018-06-06 IMAGING — DX DG CHEST 1V PORT
1 series · 1 of 1 positions shown · non-contrast
Comparison: 07/25/2017 CT and chest x-ray.

CLINICAL DATA: 82-year-old female with right-sided rib fractures.
Subsequent encounter.

EXAM:
PORTABLE CHEST 1 VIEW

[chest ap]
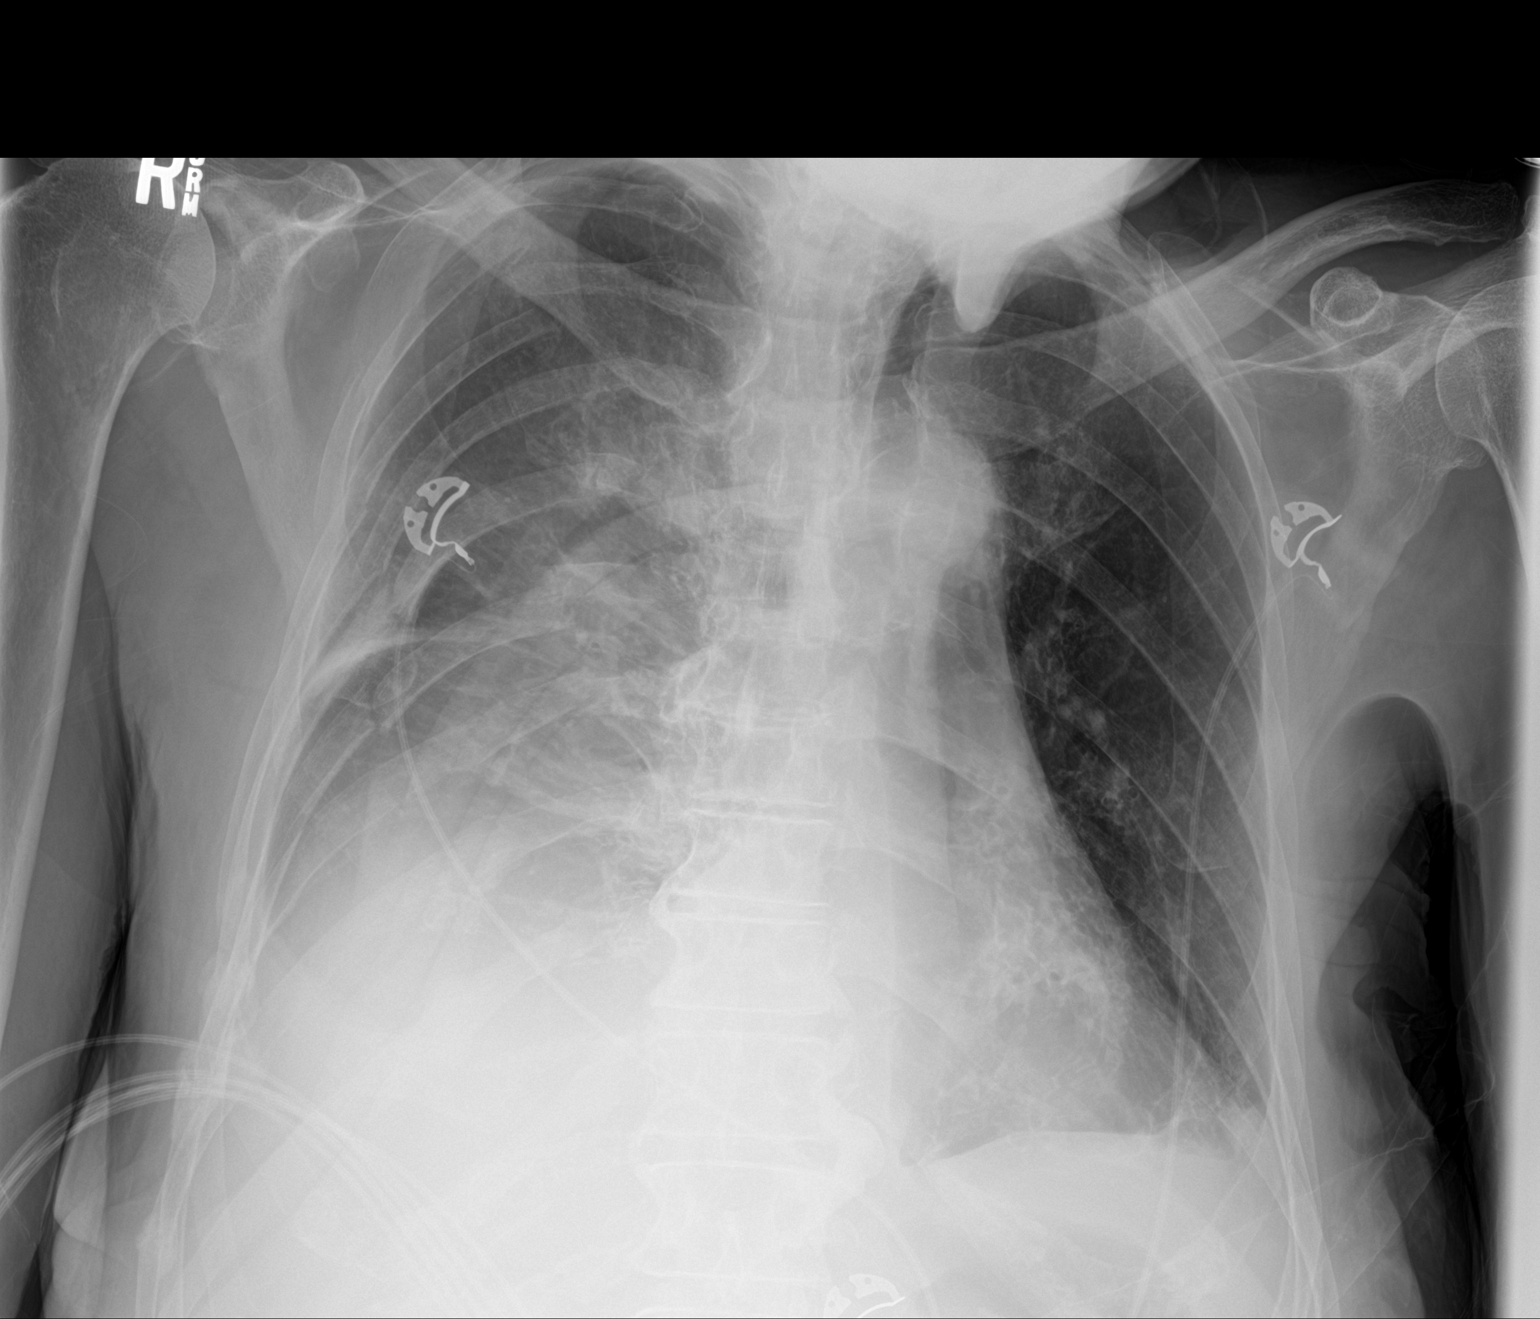

[1 of 1 positions shown; findings below may reference images not displayed]

FINDINGS: Multiple right-sided rib fractures with large layering right-sided
pleural effusion which is the same or slightly larger than on the
prior exam.

Prominent skin folds without gross pneumothorax noted.

Limited evaluation of the mediastinal and cardiac silhouette.
IMPRESSION: Multiple right-sided rib fractures with large layering right-sided
pleural effusion which is the same or slightly larger than on the
prior exam.

Prominent skin folds without gross pneumothorax noted.

## 2018-06-07 IMAGING — DX DG CHEST 1V PORT
1 series · 1 of 1 positions shown · non-contrast
Comparison: Chest x-ray of July 28, 2017

CLINICAL DATA: Right pleural effusion

EXAM:
PORTABLE CHEST 1 VIEW

[chest]
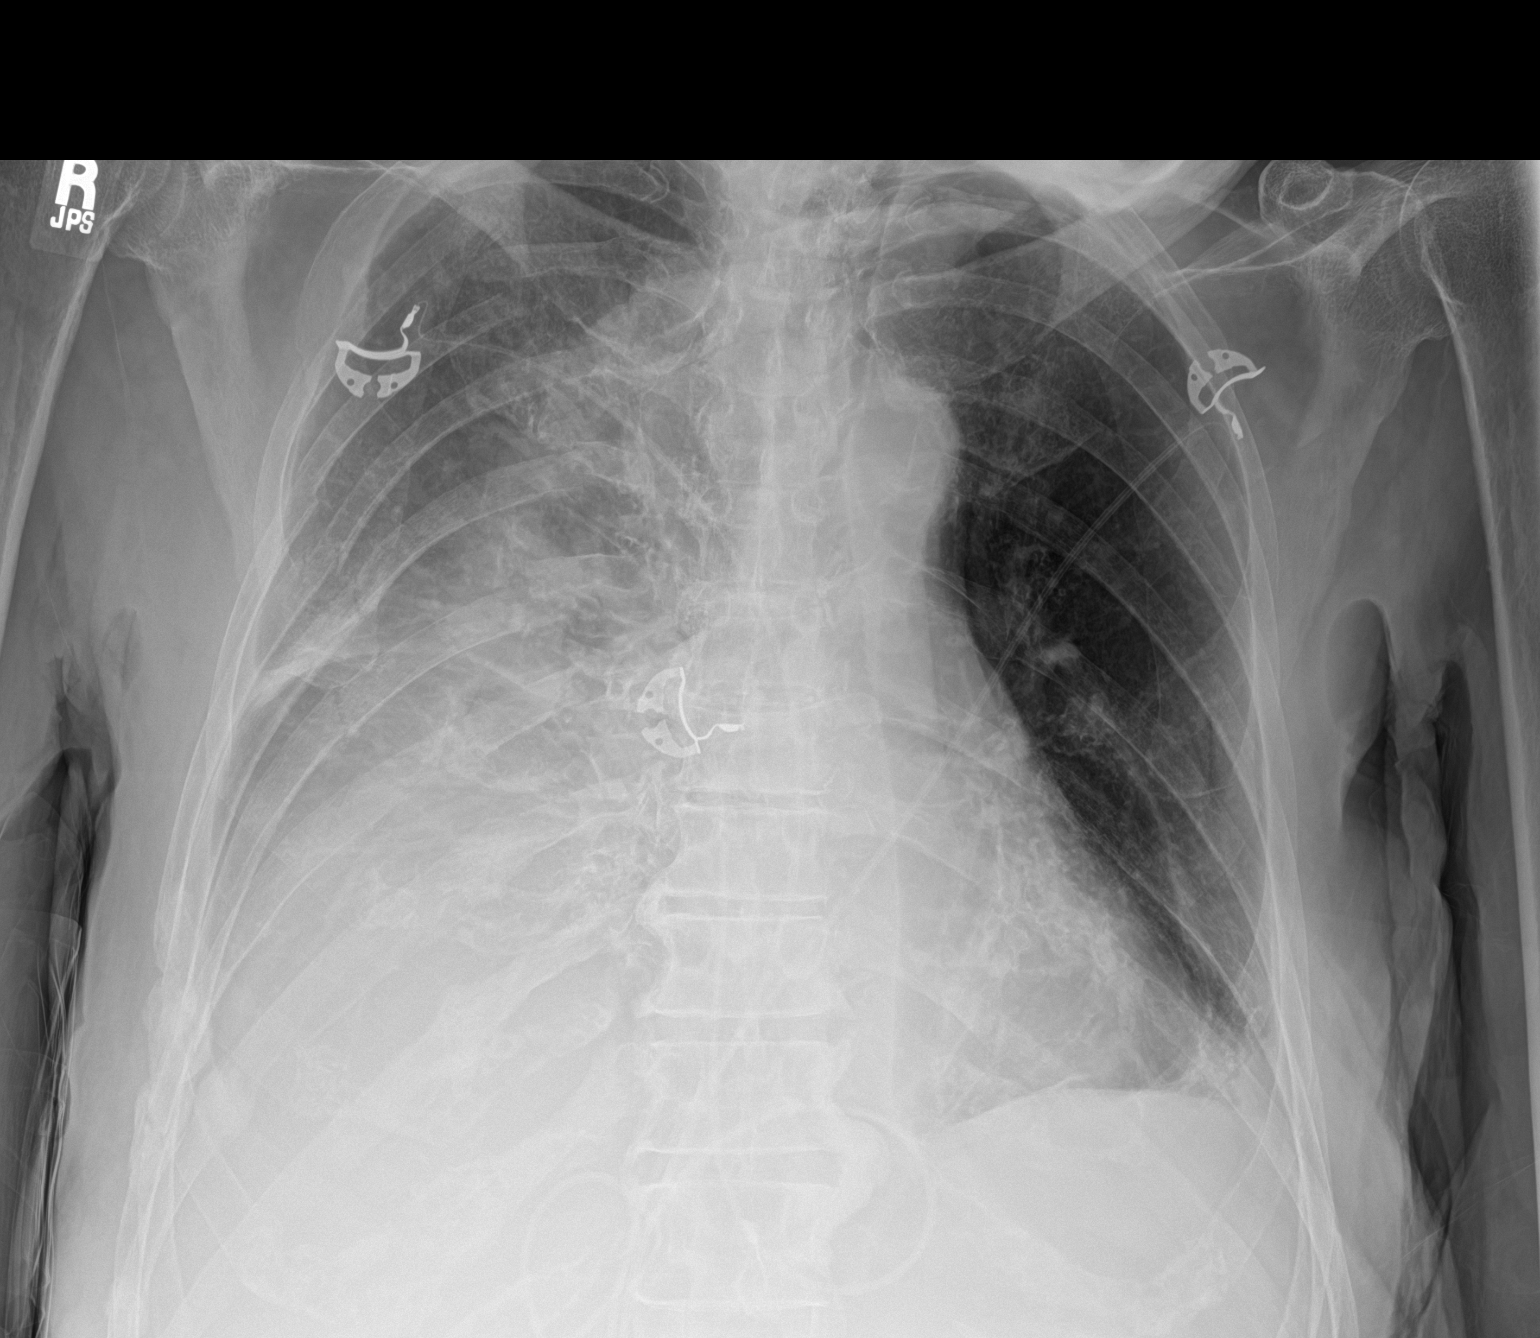

[1 of 1 positions shown; findings below may reference images not displayed]

FINDINGS: The left lung is well-expanded. There is stable parenchymal density
in the left lateral costophrenic angle. On the right there is
considerable parenchymal opacification volume the mid and lower
hemithorax. The heart is not clearly enlarged but the right heart
border is obscured. The pulmonary vascularity is not engorged.
Multiple healing right-sided rib fractures are present. There are
also acute rib fractures in the upper right hemithorax.
IMPRESSION: Stable appearance of the chest. Large right pleural effusion. Right
basilar atelectasis or pneumonia or contusion. Multiple right-sided
rib fractures.

Minimal left basilar atelectasis or scarring.  No pulmonary edema.

## 2018-06-17 ENCOUNTER — Non-Acute Institutional Stay: Payer: Self-pay | Admitting: Internal Medicine

## 2019-04-16 ENCOUNTER — Telehealth: Payer: Self-pay | Admitting: Medical

## 2019-04-16 NOTE — Telephone Encounter (Signed)
I was sent a message that Ms. Cabacungan passed away on May 06, 2019.   Please send a card to family if possible and I would contact the group home she used to live with and her prior care giver Evonnie Dawes.

## 2019-04-19 NOTE — Telephone Encounter (Signed)
Will do.  I loved her, she was sweet.

## 2019-04-21 ENCOUNTER — Telehealth: Payer: Self-pay | Admitting: Family Medicine

## 2019-04-21 NOTE — Telephone Encounter (Signed)
Sympathy cards sent to sister n law [name] and Evonnie Dawes.

## 2019-04-28 DEATH — deceased
